# Patient Record
Sex: Female | Born: 1955
Health system: Southern US, Community
[De-identification: ages and names within clinical notes are randomized; demographics above are authoritative.]

## PROBLEM LIST (undated history)

## (undated) DIAGNOSIS — I519 Heart disease, unspecified: Secondary | ICD-10-CM

## (undated) DIAGNOSIS — E079 Disorder of thyroid, unspecified: Secondary | ICD-10-CM

## (undated) DIAGNOSIS — M1990 Unspecified osteoarthritis, unspecified site: Secondary | ICD-10-CM

## (undated) DIAGNOSIS — K279 Peptic ulcer, site unspecified, unspecified as acute or chronic, without hemorrhage or perforation: Secondary | ICD-10-CM

## (undated) DIAGNOSIS — I10 Essential (primary) hypertension: Secondary | ICD-10-CM

## (undated) DIAGNOSIS — E785 Hyperlipidemia, unspecified: Secondary | ICD-10-CM

## (undated) HISTORY — PX: AORTIC VALVE REPLACEMENT: SHX41

## (undated) HISTORY — DX: Unspecified osteoarthritis, unspecified site: M19.90

## (undated) HISTORY — DX: Essential (primary) hypertension: I10

## (undated) HISTORY — DX: Peptic ulcer, site unspecified, unspecified as acute or chronic, without hemorrhage or perforation: K27.9

## (undated) HISTORY — DX: Hyperlipidemia, unspecified: E78.5

## (undated) HISTORY — DX: Disorder of thyroid, unspecified: E07.9

## (undated) HISTORY — DX: Heart disease, unspecified: I51.9

## (undated) HISTORY — PX: TOTAL ABDOMINAL HYSTERECTOMY: SHX209

---

## 1999-08-30 ENCOUNTER — Encounter: Payer: Self-pay | Admitting: *Deleted

## 1999-08-30 ENCOUNTER — Encounter: Admission: RE | Admit: 1999-08-30 | Discharge: 1999-08-30 | Payer: Self-pay | Admitting: *Deleted

## 2008-07-10 DIAGNOSIS — M129 Arthropathy, unspecified: Secondary | ICD-10-CM | POA: Insufficient documentation

## 2010-10-10 ENCOUNTER — Encounter: Payer: Self-pay | Admitting: Family Medicine

## 2012-12-04 DIAGNOSIS — M79606 Pain in leg, unspecified: Secondary | ICD-10-CM | POA: Insufficient documentation

## 2013-04-12 DIAGNOSIS — I350 Nonrheumatic aortic (valve) stenosis: Secondary | ICD-10-CM | POA: Insufficient documentation

## 2013-08-30 DIAGNOSIS — I2510 Atherosclerotic heart disease of native coronary artery without angina pectoris: Secondary | ICD-10-CM | POA: Insufficient documentation

## 2013-08-30 DIAGNOSIS — K2970 Gastritis, unspecified, without bleeding: Secondary | ICD-10-CM | POA: Insufficient documentation

## 2014-05-16 ENCOUNTER — Encounter (INDEPENDENT_AMBULATORY_CARE_PROVIDER_SITE_OTHER): Payer: Self-pay

## 2014-05-16 ENCOUNTER — Ambulatory Visit (INDEPENDENT_AMBULATORY_CARE_PROVIDER_SITE_OTHER): Payer: BC Managed Care – HMO | Admitting: Psychiatry

## 2014-05-16 VITALS — HR 69 | Ht 64.0 in | Wt 132.0 lb

## 2014-05-16 DIAGNOSIS — F339 Major depressive disorder, recurrent, unspecified: Secondary | ICD-10-CM

## 2014-05-16 DIAGNOSIS — M5136 Other intervertebral disc degeneration, lumbar region: Secondary | ICD-10-CM | POA: Insufficient documentation

## 2014-05-16 DIAGNOSIS — E039 Hypothyroidism, unspecified: Secondary | ICD-10-CM | POA: Insufficient documentation

## 2014-05-16 DIAGNOSIS — F329 Major depressive disorder, single episode, unspecified: Secondary | ICD-10-CM

## 2014-05-16 DIAGNOSIS — F32A Depression, unspecified: Secondary | ICD-10-CM | POA: Insufficient documentation

## 2014-05-16 DIAGNOSIS — F419 Anxiety disorder, unspecified: Secondary | ICD-10-CM | POA: Insufficient documentation

## 2014-05-16 DIAGNOSIS — IMO0002 Reserved for concepts with insufficient information to code with codable children: Secondary | ICD-10-CM

## 2014-05-16 DIAGNOSIS — K219 Gastro-esophageal reflux disease without esophagitis: Secondary | ICD-10-CM | POA: Insufficient documentation

## 2014-05-16 DIAGNOSIS — E785 Hyperlipidemia, unspecified: Secondary | ICD-10-CM | POA: Insufficient documentation

## 2014-05-16 DIAGNOSIS — M810 Age-related osteoporosis without current pathological fracture: Secondary | ICD-10-CM | POA: Insufficient documentation

## 2014-05-16 MED ORDER — SERTRALINE HCL 100 MG PO TABS
ORAL_TABLET | ORAL | Status: DC
Start: 2014-05-16 — End: 2014-08-29

## 2014-05-16 NOTE — Patient Instructions (Signed)
Increase zoloft to  twice a day. Follow up with all providers on regular basis.

## 2014-05-16 NOTE — Progress Notes (Signed)
Patient ID: Rachael Douglas, female   DOB: 11-24-1955, 58 y.o.   MRN: [id number]  [facility] Initial Psychiatric Assessment   Rachael Douglas [id number] 58 y.o.  05/16/2014 10:30 AM  Chief Complaint:  depression  History of Present Illness:   Patient Presents for Initial Evaluation with symptoms of depression. patient was admitted to [facility] 2 weeks ago after she learned how to that her husband left her after 42 years of marriage. She found out that her husband was interested in men. The relationship has been cold and distant for the last 14 years. But she did not expect that the relationship is still going to end. She took some other medications including gabapentin, Xanax and became sedated. She called her sister sister came and took her to the hospital she was in the near stuporous stage.  She was kept in the hospital for 2 days and her medication is somewhat adjusted. She is on a high dose of gabapentin because of her back pain. Her Xanax was discontinued. Zoloft was kept at a dose of 150 mg.  She does endorse symptoms of depression even before she went to the hospital including disturbed sleep energy appetite,. Feeling withdrawn not interested in life in general. Some crying spells but not having possible suicide. She felt that this was accidental overdose and then to does not to die. She does have the support of her sister right now she lives with her stepson.  There is no associated psychotic symptoms. She has never seen a psychiatrist before there is no associated manic symptoms currently or in the past. She was being treated by primary care for ongoing clinical depression with Zoloft but has never been a psych hospital admission or suicide attempt.  Aggravating factors; recent breakup after 42 years. Other psychosocial issues including her living situation with her son.  Modifying factors; her sister is very supportive. She does okay when she takes her  medications  Context; recent breakup  Severity of depression; 2/10. 10 being no depression  There is no current panic symptoms he does endorse she has been on Xanax since 1986 it helps her anxiety, her worries. Says that she has had esophagitis and Xanax as the medication help her at that time. Recently her primary care physician has now changed Xanax to Klonopin 0.5 mg  No associated past trauma or sexual or physical   Past Psychiatric History/Hospitalization(s) Just this recent one.   Hospitalization for psychiatric illness: Yes History of Electroconvulsive Shock Therapy: No Prior Suicide Attempts: No  Medical History; No past medical history on file.  Allergies: Allergies  Allergen Reactions  . Alendronate Other (See Comments)    unknown  . Penicillins Swelling  . Ranitidine Other (See Comments)    Facial swelling    Medications: Outpatient Encounter Prescriptions as of 05/16/2014  Medication Sig  . albuterol (PROAIR HFA) 108 (90 BASE) MCG/ACT inhaler INHALE 2 PUFFS EVERY 6 HOURS AS NEEDED  . aspirin EC 325 MG tablet TAKE 1 TABLET DAILY  . azithromycin (ZITHROMAX Z-PAK) 250 MG tablet Take 250 mg by mouth.  . clindamycin (CLEOCIN) 150 MG capsule Take 150 mg by mouth.  . cyclobenzaprine (FLEXERIL) 10 MG tablet TAKE 1 TABLET TWICE A DAY AS NEEDED FOR SPASM  . doxycycline (VIBRA-TABS) 100 MG tablet Take 100 mg by mouth.  . furosemide (LASIX) 40 MG tablet Take 40 mg by mouth.  . gabapentin (NEURONTIN) 600 MG tablet TAKE 2 TABLETS THREE TIMES A DAY  . guaiFENesin-codeine (  ROBITUSSIN AC) 100-10 MG/5ML syrup Take by mouth.  . lansoprazole (PREVACID) 30 MG capsule TAKE 1 CAPSULE DAILY  . levothyroxine (SYNTHROID, LEVOTHROID) 75 MCG tablet Take 75 mcg by mouth.  . metoprolol tartrate (LOPRESSOR) 25 MG tablet TAKE ONE-HALF (1/2) TABLET TWICE A DAY  . mometasone-formoterol (DULERA) 200-5 MCG/ACT AERO Inhale 1 puff into the lungs.  . pravastatin (PRAVACHOL) 20 MG tablet TAKE 1  TABLET DAILY  . sertraline (ZOLOFT) 100 MG tablet Take total of  .  . triamcinolone cream (KENALOG) 0.1 % APPLY TO THE AFFECTED AREAS DAILY AS NEEDED  . [DISCONTINUED] sertraline (ZOLOFT) 100 MG tablet TAKE ONE AND ONE-HALF TABLETS DAILY  . metoprolol succinate (TOPROL-XL) 25 MG 24 hr tablet Take 25 mg by mouth.     Substance Abuse History: No regular use of alcohol or other drugs.   Family History; No family history on file.    Biopsychosocial History:  She grew up with her parents. She had 3 siblings sisters. No physical or sexual trauma. 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 She's been married for 42 years. Her husband left her recently she found out that he was interested in Men.    Labs:  No results found for this or any previous visit (from the past 2160 hour(s)).  As per [facility] recent admission at care everywhere.    Musculoskeletal: Strength & Muscle Tone: within normal limits Gait & Station: normal Patient leans: Backward  Mental Status Examination;   Psychiatric Specialty Exam: Physical Exam  Vitals reviewed. Constitutional: She appears well-developed and well-nourished. She appears distressed.    Review of Systems  Constitutional: Negative.   Neurological: Negative for dizziness, tremors, speech change and focal weakness.  Psychiatric/Behavioral: Positive for depression. Negative for suicidal ideas, hallucinations, memory loss and substance abuse. The patient is nervous/anxious. The patient does not have insomnia.     Pulse 69, height  (1.626 m), weight 132 lb (59.875 kg).Body mass index is 22.65 kg/(m^2).  General Appearance: Casual  Eye Contact::  Fair  Speech:  Slow  Volume:  Normal  Mood:  Dysphoric  Affect:  Congruent  Thought Process:  Coherent  Orientation:  Full (Time, Place, and Person)  Thought Content:  Rumination  Suicidal Thoughts:  No  Homicidal Thoughts:  No  Memory:  Immediate;   Fair Recent;    Fair  Judgement:  Fair  Insight:  Shallow  Psychomotor Activity:  Normal  Concentration:  Fair  Recall:  Fair  Akathisia:  Negative  Handed:  Right  AIMS (if indicated):     Assets:  Communication Skills Desire for Improvement Financial Resources/Insurance Housing Social Support  Sleep:        Assessment: Axis I: Maj. depressive disorder, unspecified. Rule out adjustment disorder depressed mood anxiety. Rule out mood disorder secondary to general medical condition.  Axis II: Deferred  Axis III:  No past medical history on file.  Axis IV: Psychosocial, recent breakup   Treatment Plan and Summary: Increase Zoloft to 100 mg twice a day. We discussed considering the recent breakup she would take time to recover but she's as of now not suicidal. She would benefit from therapy. Avoid taking Xanax to stop. She started on Klonopin cautioned not to take Klonopin on a long-term. Pertinent Labs and Relevant Prior Notes reviewed. Medication Side effects, benefits and risks reviewed/discussed with Patient. Time given for patient to respond and asks questions regarding the Diagnosis and Medications. Safety concerns and to report to  ER if suicidal or call 911. Relevant Medications refilled or called in to pharmacy. Discussed weight maintenance and Sleep Hygiene. Follow up with Primary care provider in regards to Medical conditions. Recommend compliance with medications and follow up office appointments. Discussed to avail opportunity to consider or/and continue Individual therapy with Counselor. Greater than 50% of time was spend in counseling and coordination of care with the patient.  Schedule for Follow up visit in 4 weeks or call in earlier as necessary.   Thresa Ross, MD 05/16/2014

## 2014-06-16 ENCOUNTER — Ambulatory Visit (HOSPITAL_COMMUNITY): Payer: Self-pay | Admitting: Psychiatry

## 2014-08-29 ENCOUNTER — Ambulatory Visit (INDEPENDENT_AMBULATORY_CARE_PROVIDER_SITE_OTHER): Payer: BC Managed Care – HMO | Admitting: Physician Assistant

## 2014-08-29 ENCOUNTER — Encounter (HOSPITAL_COMMUNITY): Payer: Self-pay | Admitting: Physician Assistant

## 2014-08-29 ENCOUNTER — Encounter (INDEPENDENT_AMBULATORY_CARE_PROVIDER_SITE_OTHER): Payer: Self-pay

## 2014-08-29 VITALS — HR 92 | Ht 64.0 in | Wt 122.0 lb

## 2014-08-29 DIAGNOSIS — F331 Major depressive disorder, recurrent, moderate: Secondary | ICD-10-CM

## 2014-08-29 DIAGNOSIS — F411 Generalized anxiety disorder: Secondary | ICD-10-CM

## 2014-08-29 DIAGNOSIS — F329 Major depressive disorder, single episode, unspecified: Secondary | ICD-10-CM

## 2014-08-29 MED ORDER — ARIPIPRAZOLE 5 MG PO TABS: 5.0000 mg | ORAL_TABLET | Freq: Every day | ORAL | Status: DC

## 2014-08-29 MED ORDER — SERTRALINE HCL 100 MG PO TABS: ORAL_TABLET | ORAL | Status: DC

## 2014-08-29 NOTE — Progress Notes (Signed)
[facility] [id number] Progress Note  Rachael BowlViolet R Douglas [id number] 58 y.o.  08/29/2014 4:22 PM  Chief Complaint:  MDD severe  History of Present Illness:  Patient notes that she doesn't care whether she lives or dies xanax is the only thing that helps... Suicidal Ideation: No Plan Formed: No Patient has means to carry out plan: No  Homicidal Ideation: No Plan Formed: No Patient has means to carry out plan: No  Review of Systems: Psychiatric: Agitation: Yes Hallucination: No Depressed Mood: Yes Insomnia: No Hypersomnia: No Altered Concentration: No Feels Worthless: No Grandiose Ideas: No Belief In Special Powers: No New/Increased Substance Abuse: No Compulsions: No  Neurologic: Headache: No Seizure: No Paresthesias: No  Past Medical Family, Social History: Husband of 40+ years walked out in August, 40 yr old son lives in basement, doesn't pay rent. Patient is unemployed since 2009, husband agreed to let a family friend live in the home while he recovers from cancer, with plans for the wife to care for him without consulting her.  Outpatient Encounter Prescriptions as of 08/29/2014  Medication Sig  . albuterol (PROAIR HFA) 108 (90 BASE) MCG/ACT inhaler INHALE 2 PUFFS EVERY 6 HOURS AS NEEDED  . aspirin EC 325 MG tablet TAKE 1 TABLET DAILY  . azithromycin (ZITHROMAX Z-PAK) 250 MG tablet Take 250 mg by mouth.  . clindamycin (CLEOCIN) 150 MG capsule Take 150 mg by mouth.  . cyclobenzaprine (FLEXERIL) 10 MG tablet TAKE 1 TABLET TWICE A DAY AS NEEDED FOR SPASM  . doxycycline (VIBRA-TABS) 100 MG tablet Take 100 mg by mouth.  . furosemide (LASIX) 40 MG tablet Take 40 mg by mouth.  . gabapentin (NEURONTIN) 600 MG tablet TAKE 2 TABLETS THREE TIMES A DAY  . guaiFENesin-codeine (ROBITUSSIN AC) 100-10 MG/5ML syrup Take by mouth.  . lansoprazole (PREVACID) 30 MG capsule TAKE 1 CAPSULE DAILY  . levothyroxine (SYNTHROID, LEVOTHROID) 75 MCG tablet Take 75 mcg by mouth.  .  metoprolol succinate (TOPROL-XL) 25 MG 24 hr tablet Take 25 mg by mouth.  . metoprolol tartrate (LOPRESSOR) 25 MG tablet TAKE ONE-HALF (1/2) TABLET TWICE A DAY  . mometasone-formoterol (DULERA) 200-5 MCG/ACT AERO Inhale 1 puff into the lungs.  [name]. oxymorphone (OPANA) 5 MG tablet Take 5 mg by mouth every 6 (six) hours as needed for pain.  . pravastatin (PRAVACHOL) 20 MG tablet TAKE 1 TABLET DAILY  . sertraline (ZOLOFT) 100 MG tablet Take total of 200mg  .  . triamcinolone cream (KENALOG) 0.1 % APPLY TO THE AFFECTED AREAS DAILY AS NEEDED    Past Psychiatric History/Hospitalization(s): none Anxiety: Yes Bipolar Disorder: No Depression: Yes Mania: No Psychosis: No Schizophrenia: No Personality Disorder: No Hospitalization for psychiatric illness: No History of Electroconvulsive Shock Therapy: No Prior Suicide Attempts: No  Physical Exam: Constitutional:  Pulse 92  Ht 5\' 4"  (1.626 m)  Wt 122 lb (55.339 kg)  BMI 20.93 kg/m2  General Appearance: alert, oriented, no acute distress  Musculoskeletal: Strength & Muscle Tone: within normal limits Gait & Station: normal Patient leans: N/A  Psychiatric: Speech (describe rate, volume, coherence, spontaneity, and abnormalities if any): normal  Thought Process (describe rate, content, abstract reasoning, and computation): normal  Associations: Coherent and Relevant  Thoughts: normal  Mental Status: Orientation: oriented to person, place, situation and day of week Mood & Affect: depressed affect Attention Span & Concentration: fair  Medical Decision Making (Choose Three): Established Problem, Stable/Improving (1) and New problem, with additional work up planned  Assessment: Axis I: MDD    Plan:  1. Medication management. 2. Add Aripiprazole 5mg  po qd. Gave patient Patient Assist Card for discount and information. 3. Patient clearly explained that Benzodiazepines will not be prescribed. >50% of visit is spent in education and  discussion of treatment plan. 4. Patient will also need to attend OUT Pt. Therapy as discussed. 5. Follow up as directed.  Rachael Boston, PA-C 08/29/2014

## 2014-09-23 ENCOUNTER — Ambulatory Visit (HOSPITAL_COMMUNITY): Payer: Self-pay | Admitting: Physician Assistant

## 2014-09-25 ENCOUNTER — Ambulatory Visit (INDEPENDENT_AMBULATORY_CARE_PROVIDER_SITE_OTHER): Payer: BLUE CROSS/BLUE SHIELD | Admitting: Family Medicine

## 2014-09-25 ENCOUNTER — Encounter: Payer: Self-pay | Admitting: Family Medicine

## 2014-09-25 VITALS — BP 132/70 | HR 81 | Ht 64.5 in | Wt 119.0 lb

## 2014-09-25 DIAGNOSIS — F331 Major depressive disorder, recurrent, moderate: Secondary | ICD-10-CM | POA: Diagnosis not present

## 2014-09-25 DIAGNOSIS — I10 Essential (primary) hypertension: Secondary | ICD-10-CM

## 2014-09-25 DIAGNOSIS — E785 Hyperlipidemia, unspecified: Secondary | ICD-10-CM | POA: Diagnosis not present

## 2014-09-25 DIAGNOSIS — E039 Hypothyroidism, unspecified: Secondary | ICD-10-CM

## 2014-09-25 DIAGNOSIS — Z1239 Encounter for other screening for malignant neoplasm of breast: Secondary | ICD-10-CM

## 2014-09-25 DIAGNOSIS — J449 Chronic obstructive pulmonary disease, unspecified: Secondary | ICD-10-CM

## 2014-09-25 DIAGNOSIS — M810 Age-related osteoporosis without current pathological fracture: Secondary | ICD-10-CM

## 2014-09-25 LAB — COMPLETE METABOLIC PANEL WITH GFR
ALT: 11 U/L (ref 0–35)
AST: 18 U/L (ref 0–37)
Albumin: 4.2 g/dL (ref 3.5–5.2)
Alkaline Phosphatase: 81 U/L (ref 39–117)
BUN: 14 mg/dL (ref 6–23)
CALCIUM: 9.1 mg/dL (ref 8.4–10.5)
CO2: 27 mEq/L (ref 19–32)
CREATININE: 0.93 mg/dL (ref 0.50–1.10)
Chloride: 103 mEq/L (ref 96–112)
GFR, EST AFRICAN AMERICAN: 78 mL/min
GFR, EST NON AFRICAN AMERICAN: 67 mL/min
Glucose, Bld: 85 mg/dL (ref 70–99)
Potassium: 3.6 mEq/L (ref 3.5–5.3)
Sodium: 140 mEq/L (ref 135–145)
Total Bilirubin: 0.4 mg/dL (ref 0.2–1.2)
Total Protein: 6.7 g/dL (ref 6.0–8.3)

## 2014-09-25 LAB — LIPID PANEL
CHOLESTEROL: 204 mg/dL — AB (ref 0–200)
HDL: 71 mg/dL (ref >39)
LDL CALC: 115 mg/dL — AB (ref 0–99)
TRIGLYCERIDES: 90 mg/dL (ref <150)
Total CHOL/HDL Ratio: 2.9 Ratio
VLDL: 18 mg/dL (ref 0–40)

## 2014-09-25 LAB — TSH: TSH: 1.748 u[IU]/mL (ref 0.350–4.500)

## 2014-09-25 MED ORDER — RISEDRONATE SODIUM 35 MG PO TABS: 35.0000 mg | ORAL_TABLET | ORAL | Status: DC

## 2014-09-25 MED ORDER — PREDNISONE 20 MG PO TABS: ORAL_TABLET | ORAL | Status: DC

## 2014-09-25 NOTE — Progress Notes (Signed)
CC: Rachael Douglas is a 59 y.o. female is here for Establish Care   Subjective: HPI:  59 year old here to establish care  Complains of COPD that has been present for at least a decade she is to be a smoker but no longer smokes. She reports at baseline she has no shortness of breath with activity nor cough or wheezing. Over the last week she's had drainage in the back of her throat with a nonproductive cough and mild shortness of breath. Symptoms are persistent mild in severity but have not been getting better or worse since onset. Interventions have included continued use of dulera and she has not tried using her albuterol during this time of illness. She denies fevers, chills, nausea, vomiting nor chest pain.  Complains of long-standing depression that has been present for matter of years. She has a history of an overdose however she tells me this was unintentional double dosage of her narcotics. Looking at emergency room notes from last August when she was hospitalized she was quite hostile when she was eventually involuntarily committed to the behavioral health unit. She recently saw our behavioral health colleagues down stairs however she has decided not to go back and see them due to personality differences.  She tells me she has suicidal thoughts but does not have a plan and does not feel that she would act on these thoughts. Depression and suicidal thoughts are heavily influenced by her husband recently cheating on her with another man.  She is a history of hypothyroidism currently taking 75 g of levothyroxine on a daily basis. It's been over a year since her thyroid function was checked last. She complains of chronic dry skin and recent hair loss over the past few months. There's been no unintentional weight gain or loss nor any gastrointestinal complaints  She has a history of hyperlipidemia and it appears her cholesterol has not been checked in the last year. She is taking pravastatin on a  daily basis without myalgias or right upper quadrant pain.  She has a history of essential hypertension currently taking metoprolol twice a day but XR  Formulation. No outside blood pressures to report.  She has a history of osteoporosis and believes that she had a DEXA scan within the last year, the only results I have access to is a February 2014 reading reflecting a lumbar T score of -2.6, femur T score of -3.2. She tells me she has tried Fosamax in the past however it caused intolerable dysphagia.  Review of Systems - General ROS: negative for - chills, fever, night sweats, weight gain or weight loss Ophthalmic ROS: negative for - decreased vision ENT ROS: negative for - hearing change, nasal congestion, tinnitus or allergies Hematological and Lymphatic ROS: negative for - bleeding problems, bruising or swollen lymph nodes Breast ROS: negative Respiratory ROS: no blood and sputum Cardiovascular ROS: no chest pain or dyspnea on exertion Gastrointestinal ROS: no abdominal pain, change in bowel habits, or black or bloody stools Genito-Urinary ROS: negative for - genital discharge, genital ulcers, incontinence or abnormal bleeding from genitals Musculoskeletal ROS: negative for - joint pain or muscle pain other than chronic back pain and right knee pain Neurological ROS: negative for - headaches or memory loss Dermatological ROS: negative for lumps, mole changes, rash and skin lesion changes  Past Medical History  Diagnosis Date  . Heart disease   . Thyroid disease     Past Surgical History  Procedure Laterality Date  . Total abdominal hysterectomy    .  Aortic valve replacement     Family History  Problem Relation Age of Onset  . Cancer    . Depression    . Diabetes    . Hypertension      History   Social History  . Marital Status: Married    Spouse Name: N/A    Number of Children: N/A  . Years of Education: N/A   Occupational History  . Not on file.   Social History  Main Topics  . Smoking status: Former Smoker    Types: Cigarettes    Quit date: 05/30/2013  . Smokeless tobacco: Never Used  . Alcohol Use: No  . Drug Use: No  . Sexual Activity: Not Currently   Other Topics Concern  . Not on file   Social History Narrative     Objective: BP 132/70 mmHg  Pulse 81  Ht 5' 4.5" (1.638 m)  Wt 119 lb (53.978 kg)  BMI 20.12 kg/m2  General: Alert and Oriented, No Acute Distress HEENT: Pupils equal, round, reactive to light. Conjunctivae clear.  Moist pedis membranes parents unremarkable Lungs: Comfortable work of breathing with trace end expiratory rhonchi in the central lung fields without any wheezing or rales. Cardiac: Regular rate and rhythm. Normal S1/S2.  No murmurs, rubs, nor gallops.   Extremities: No peripheral edema.  Strong peripheral pulses.  Mental Status: No depression, anxiety, nor agitation. Skin: Warm and dry.  Assessment & Plan: [name] was seen today for establish care.  Diagnoses and associated orders for this visit:  COPD, moderate - predniSONE (DELTASONE) 20 MG tablet; Three tabs at once daily for five days.  Major depressive disorder, recurrent episode, moderate - Ambulatory referral to Psychiatry  Acquired hypothyroidism - TSH  HLD (hyperlipidemia) - Lipid panel  OP (osteoporosis) - risedronate (ACTONEL) 35 MG tablet; Take 1 tablet (35 mg total) by mouth every 7 (seven) days. with water on empty stomach, nothing by mouth or lie down for next 30 minutes.  Essential hypertension - COMPLETE METABOLIC PANEL WITH GFR  Screening for malignant neoplasm of breast - MM DIGITAL SCREENING BILATERAL; Future    COPD exacerbation: Start prednisone for only 5 days continued Dulera begin using albuterol on an as-needed basis for cough shortness of breath or wheezing Major depressive disorder: We'll provide referral to [facility] psychiat[facility] to discuss with her that I feel her degree of depression and history of intentional or  unintentional overdose warrants specialty care of her mental health. Hypothyroidism: Clinically uncontrolled with her recent loss of hair needs TSH today continue 75 g of levothyroxine pending results Hyperlipidemia: Overdue for lipid panel and liver function Osteoporosis: Uncontrolled strongly encourage her to start on a bisphosphonate we discussed the risks and benefits of taking a medication such as Actonel which she is agreeable to begin Essential hypertension: Controlled continue metoprolol, check a renal function Due for screening mammogram  60 minutes spent face-to-face during visit today of which at least 50% was counseling or coordinating care regarding: 1. COPD, moderate   2. Major depressive disorder, recurrent episode, moderate   3. Acquired hypothyroidism   4. HLD (hyperlipidemia)   5. OP (osteoporosis)   6. Essential hypertension   7. Screening for malignant neoplasm of breast       Return in about 3 months (around 12/25/2014) for Routine Follow Up.

## 2014-09-26 ENCOUNTER — Telehealth: Payer: Self-pay | Admitting: Family Medicine

## 2014-09-26 ENCOUNTER — Ambulatory Visit (HOSPITAL_COMMUNITY): Payer: Self-pay | Admitting: Physician Assistant

## 2014-09-26 ENCOUNTER — Other Ambulatory Visit: Payer: Self-pay | Admitting: *Deleted

## 2014-09-26 DIAGNOSIS — J449 Chronic obstructive pulmonary disease, unspecified: Secondary | ICD-10-CM

## 2014-09-26 MED ORDER — PREDNISONE 20 MG PO TABS: ORAL_TABLET | ORAL | Status: AC

## 2014-09-26 MED ORDER — LEVOTHYROXINE SODIUM 75 MCG PO TABS: 75.0000 ug | ORAL_TABLET | Freq: Every day | ORAL | Status: DC

## 2014-09-26 MED ORDER — PRAVASTATIN SODIUM 40 MG PO TABS: 40.0000 mg | ORAL_TABLET | Freq: Every day | ORAL | Status: DC

## 2014-09-26 NOTE — Telephone Encounter (Signed)
Sue Lushndrea, Will you please let patient know that her levothyroxine dose appears to be adequate, [facility]'ve [facility].  Her LDL cholesterol does not appear to be at goal therefore i've sent in an increased pravastatin dose of 40mg  [facility].  I'd recommend follow up in 3 months.

## 2014-09-26 NOTE — Telephone Encounter (Signed)
Pt.notified

## 2014-09-29 ENCOUNTER — Ambulatory Visit (INDEPENDENT_AMBULATORY_CARE_PROVIDER_SITE_OTHER): Payer: BLUE CROSS/BLUE SHIELD | Admitting: Licensed Clinical Social Worker

## 2014-09-29 ENCOUNTER — Encounter (HOSPITAL_COMMUNITY): Payer: Self-pay | Admitting: Licensed Clinical Social Worker

## 2014-09-29 ENCOUNTER — Telehealth: Payer: Self-pay | Admitting: *Deleted

## 2014-09-29 DIAGNOSIS — F331 Major depressive disorder, recurrent, moderate: Secondary | ICD-10-CM | POA: Diagnosis not present

## 2014-09-29 NOTE — Progress Notes (Signed)
Patient:   Rachael Douglas   DOB:   02/10/1956  MR Number:  [id number]  Location:  [facility] [place] [address] [place] [address] Dept: [phone]           Date of Service:   09/29/14  Start Time:   11:05am End Time:   12:05pm  Provider/Observer:  Marilu Favre Clinical Social Work       Billing Code/Service: [phone]  Comprehensive Clinical Assessment  Information for assessment provided by: patient   Chief Complaint:   Anger and depression      Presenting Problem/Symptoms:  Having trouble coming to terms with learning that her husband of over 40 years had been having a relationship with a man.  He left in August.   She has a lot of anger and resentment.   She has 3 adults living in her home who are not contributing financially.      Behavioral Observation: Rachael Douglas  presents as a 59 y.o.-year-old  Caucasian Female who appeared her stated age. her dress was Appropriate and she was Fairly Groomed.  She presented as agitated stating that she did not particularly want to be here.  Overall level of cooperation and motivation was minimal.  There were physical disabilities noted.  She insisted on sitting on the couch because of reported pain from arthritis.        Mental Health Symptoms:    Depression:    Current symptoms include depressed mood, insomnia, psychomotor agitation, fatigue, feelings of worthlessness/guilt, difficulty concentrating, hopelessness, impaired memory, recurrent thoughts of death, weight loss, decreased appetite,.      Anxiety: Moderate restlessness or feeling on edge, easily fatigued, irritability, difficulty concentrating, muscle tension, sleep disturbance  Panic Attacks: Absent   Self-Harm Potential: Thoughts of Self-Harm: vague current thoughts Method: no plan Availability of means: na  Previous attempts? no Preoccupation with death?  yes   Dangerousness to Others Potential: Denies HI     Mania/hypomania: na    Psychosis: na            Mental Status  Interactions:    Minimal   Attention:   Variable  Memory:   poor recent  Speech:   Normal  Flow of Thought:  circumstantial  Thought Content:  Rumination  Orientation:   person, place and time/date  Judgment:   Fair  Affect/Mood:   Irritable  Insight:   Lacking       Medical History:    Past Medical History  Diagnosis Date  . Heart disease   . Thyroid disease    Arthritis  Current medications:        Outpatient Encounter Prescriptions as of 09/29/2014  Medication Sig  . albuterol (PROAIR HFA) 108 (90 BASE) MCG/ACT inhaler INHALE 2 PUFFS EVERY 6 HOURS AS NEEDED  . ARIPiprazole (ABILIFY) 5 MG tablet Take 1 tablet (5 mg total) by mouth daily.  [name] aspirin EC 325 MG tablet TAKE 1 TABLET DAILY  . clindamycin (CLEOCIN) 150 MG capsule Take 150 mg by mouth.  . cyclobenzaprine (FLEXERIL) 10 MG tablet TAKE 1 TABLET TWICE A DAY AS NEEDED FOR SPASM  . furosemide (LASIX) 40 MG tablet Take 40 mg by mouth.  . gabapentin (NEURONTIN) 600 MG tablet TAKE 2 TABLETS THREE TIMES A DAY  . lansoprazole (PREVACID) 30 MG capsule TAKE 1 CAPSULE DAILY  . levothyroxine (SYNTHROID, LEVOTHROID) 75 MCG tablet Take 1 tablet (75 mcg total) by  mouth daily before breakfast.  . metoprolol succinate (TOPROL-XL) 25 MG 24 hr tablet Take 25 mg by mouth daily. Take 1/2 in the am and 1/2 in the pm  . mometasone-formoterol (DULERA) 200-5 MCG/ACT AERO Inhale 1 puff into the lungs.  [name]. oxymorphone (OPANA) 5 MG tablet Take 5 mg by mouth every 6 (six) hours as needed for pain.  . pravastatin (PRAVACHOL) 40 MG tablet Take 1 tablet (40 mg total) by mouth daily. TAKE 1 TABLET DAILY  . predniSONE (DELTASONE) 20 MG tablet Three tabs at once daily for five days.  . risedronate (ACTONEL) 35 MG tablet Take 1 tablet (35 mg total) by mouth every 7 (seven) days. with water on empty  stomach, nothing by mouth or lie down for next 30 minutes.  . sertraline (ZOLOFT) 100 MG tablet Take total of 200mg  .  . triamcinolone cream (KENALOG) 0.1 % APPLY TO THE AFFECTED AREAS DAILY AS NEEDED              Mental Health/Substance Use Treatment History:    Denies      Family Med/Psych History:  Family History  Problem Relation Age of Onset  . Cancer    . Depression    . Diabetes    . 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  She acknowledges having a lot of anger about him and others keeping secrets for so many years.    Disposition/Plan: After explaining the types of interventions she can expect in individual therapy the patient expressed ambivilence about participating.  Will call back to schedule if she decides to participate.  Interventions will focus on processing thoughts and feelings related to her marriage, teaching skills for identifying and changing negative or irrational thinking patterns and teaching skills for stress management.

## 2014-09-29 NOTE — Telephone Encounter (Signed)
Pt left a message that her knee was very swollen and wanted an xray ordered. Spoke with pt and advised to schedule an appt. Pt states she will wait until the am and see if it's still swollen and then she will decide if she needs to come in

## 2014-10-01 ENCOUNTER — Ambulatory Visit (INDEPENDENT_AMBULATORY_CARE_PROVIDER_SITE_OTHER): Payer: BLUE CROSS/BLUE SHIELD

## 2014-10-01 DIAGNOSIS — Z1231 Encounter for screening mammogram for malignant neoplasm of breast: Secondary | ICD-10-CM

## 2014-10-01 DIAGNOSIS — Z1239 Encounter for other screening for malignant neoplasm of breast: Secondary | ICD-10-CM

## 2014-10-03 ENCOUNTER — Ambulatory Visit: Payer: Self-pay | Admitting: Family Medicine

## 2014-10-13 ENCOUNTER — Telehealth: Payer: Self-pay | Admitting: Family Medicine

## 2014-10-13 NOTE — Telephone Encounter (Signed)
Patient walked in and req to get a refill of Linzess. Thanks

## 2014-10-13 NOTE — Telephone Encounter (Signed)
Don't see this on her med list or scanned med list that she brought to office. Office visit required. vm left

## 2014-10-21 ENCOUNTER — Telehealth: Payer: Self-pay | Admitting: *Deleted

## 2014-10-21 NOTE — Telephone Encounter (Signed)
Pt called and wanted to know if she could increase the dose of the abilify. Called pt back and told her that Verne SpurrNeil Mashburn PA-c would be the one to manage this medication since she is under his care. The patient then asked if I thought it would be ok if she stopped taking the medication. I advised her that I could not give her any advice about the abilify  since I am not a healthcare provider and also since it was prescribed by a specialist in another office. Encouraged her to call behavioral health with her concerns. Pt voiced understanding

## 2014-10-23 ENCOUNTER — Encounter: Payer: Self-pay | Admitting: Family Medicine

## 2014-10-23 DIAGNOSIS — K222 Esophageal obstruction: Secondary | ICD-10-CM | POA: Insufficient documentation

## 2014-10-23 DIAGNOSIS — Z952 Presence of prosthetic heart valve: Secondary | ICD-10-CM | POA: Insufficient documentation

## 2014-10-30 ENCOUNTER — Telehealth: Payer: Self-pay | Admitting: *Deleted

## 2014-10-30 NOTE — Telephone Encounter (Signed)
Dr. Nyoka CowdenPuthaval is not accepting new patients because they have a back log [facility] are [facility]

## 2014-10-30 NOTE — Telephone Encounter (Signed)
Can you please let patient know this and can you see if there is any other nearby psych office accepting patients, for now I'd recommend she stay established with behavioral health downstairs.

## 2014-10-30 NOTE — Telephone Encounter (Signed)
Pt notified; routing to jennifer to see who is accepting new patients

## 2014-11-07 ENCOUNTER — Telehealth: Payer: Self-pay | Admitting: *Deleted

## 2014-11-07 NOTE — Telephone Encounter (Signed)
Pt called again wanting us to refill linzess and abilify. Again I explained to pt that she needs to f/u with behavioral health since they are managing the abilify. Pt needs appt for linzess since its not on her med list and she has not been seen for dx related to this med.pt voiced understanding

## 2014-11-12 ENCOUNTER — Telehealth: Payer: Self-pay

## 2014-11-12 NOTE — Telephone Encounter (Signed)
Fax refill request for Linzess 145 mcg capsules take once daily # 90. Medication is not on medication list. The Linzess is on outside medications under [facility].

## 2014-11-13 MED ORDER — LINACLOTIDE 145 MCG PO CAPS: 145.0000 ug | ORAL_CAPSULE | Freq: Every day | ORAL | Status: DC

## 2014-11-13 NOTE — Telephone Encounter (Signed)
Rx approved, Rx sent to her [facility]

## 2014-11-13 NOTE — Telephone Encounter (Signed)
Called Rachael Douglas to let her know that her linzess was faxed over to the pharmacy today. - CF

## 2014-12-17 ENCOUNTER — Encounter: Payer: Self-pay | Admitting: Family Medicine

## 2014-12-17 ENCOUNTER — Ambulatory Visit (INDEPENDENT_AMBULATORY_CARE_PROVIDER_SITE_OTHER): Payer: BLUE CROSS/BLUE SHIELD | Admitting: Family Medicine

## 2014-12-17 VITALS — BP 126/74 | HR 91 | Wt 117.0 lb

## 2014-12-17 DIAGNOSIS — K5900 Constipation, unspecified: Secondary | ICD-10-CM

## 2014-12-17 DIAGNOSIS — J302 Other seasonal allergic rhinitis: Secondary | ICD-10-CM

## 2014-12-17 DIAGNOSIS — F32A Depression, unspecified: Secondary | ICD-10-CM

## 2014-12-17 DIAGNOSIS — F418 Other specified anxiety disorders: Secondary | ICD-10-CM

## 2014-12-17 DIAGNOSIS — G4700 Insomnia, unspecified: Secondary | ICD-10-CM | POA: Diagnosis not present

## 2014-12-17 DIAGNOSIS — F419 Anxiety disorder, unspecified: Principal | ICD-10-CM

## 2014-12-17 DIAGNOSIS — F329 Major depressive disorder, single episode, unspecified: Secondary | ICD-10-CM

## 2014-12-17 MED ORDER — ARIPIPRAZOLE 5 MG PO TABS: 5.0000 mg | ORAL_TABLET | Freq: Every day | ORAL | Status: DC

## 2014-12-17 MED ORDER — MONTELUKAST SODIUM 10 MG PO TABS: 10.0000 mg | ORAL_TABLET | Freq: Every day | ORAL | Status: DC

## 2014-12-17 MED ORDER — ZOLPIDEM TARTRATE 5 MG PO TABS: 5.0000 mg | ORAL_TABLET | Freq: Every evening | ORAL | Status: DC | PRN

## 2014-12-17 NOTE — Progress Notes (Signed)
CC: Rachael Douglas is a 59 y.o. female is here for wants Dr. Ivan AnchorsHommel to take over psych meds   Subjective: HPI:  Since I saw her last she has not returned to  Psychiatry downstairs. She is requesting refills on Abilify today and would like for me to take over this medication. She tells me that she's been much more anxious and subjectively depressed lately since her husband left her and is no longer providing her with any financial support. She's having difficulty staying asleep and falling asleep. Her appetite seems to be regular. There have been no thoughts of wanting to harm herself or others.  She does note she cannot identify what exactly is keeping her awake at night. She seems to think about all the things in her life causing her stress and she continues to worry daily. Symptoms are severe in severity. Present on a nightly basis. Nothing seems to make it better or worse that she knows of other than that described above  She continues to have constipation if she does not take linzess on a daily basis.providedshe takes this on a daily basis she has no difficulty with bowel movements or constipation.  Complaining of postnasal drip with a nonproductive cough that has been present ever since the pollen came out. She is taking Benadryl but she still has some symptoms that linger to mild to moderate degree on daily basis. She is not having any nocturnal need for albuterol and she tells me that she uses her albuterol "rarely". No wheezing shortness of breath fevers or chills  Review Of Systems Outlined In HPI  Past Medical History  Diagnosis Date  . Heart disease   . Thyroid disease   . Arthritis     Past Surgical History  Procedure Laterality Date  . Total abdominal hysterectomy    . Aortic valve replacement     Family History  Problem Relation Age of Onset  . Cancer    . Depression    . Diabetes    . Hypertension      History   Social History  . Marital Status: Married    Spouse  Name: N/A  . Number of Children: N/A  . Years of Education: N/A   Occupational History  . Not on file.   Social History Main Topics  . Smoking status: Former Smoker    Types: Cigarettes    Quit date: 05/30/2013  . Smokeless tobacco: Never Used  . Alcohol Use: No  . Drug Use: No  . Sexual Activity: Not Currently   Other Topics Concern  . Not on file   Social History Narrative     Objective: BP 126/74 mmHg  Pulse 91  Wt 117 lb (53.071 kg)  General: Alert and Oriented, No Acute Distress HEENT: Pupils equal, round, reactive to light. Conjunctivae clear.  Moist mucous membrane pharynx unremarkable Lungs: Clear to auscultation bilaterally, no wheezing/ronchi/rales.  Comfortable work of breathing. Good air movement. Cardiac: Regular rate and rhythm. Normal S1/S2.  Grade 2/6 systolic murmur, rubs, nor gallops.   Extremities: No peripheral edema.  Strong peripheral pulses.  Mental Status: mild depression no agitation or anxiety. Skin: Warm and dry.  Assessment & Plan: Masayo was seen today for wants dr. Ivan Anchorshommel to take over psych meds.  Diagnoses and all orders for this visit:  Anxiety and depression  Insomnia  Constipation, unspecified constipation type  Seasonal allergies  Other orders -     ARIPiprazole (ABILIFY) 5 MG tablet; Take 1 tablet (5  mg total) by mouth daily. -     montelukast (SINGULAIR) 10 MG tablet; Take 1 tablet (10 mg total) by mouth at bedtime. -     zolpidem (AMBIEN) 5 MG tablet; Take 1 tablet (5 mg total) by mouth at bedtime as needed for sleep.   Anxiety and depression: Uncontrolled, again I've advised her that she needs to see a psychiatrist for management of anxiety and depression keeping in mind her long-standing history of depression and narcotic overdose last year. I informed her that I will give her one month of Abilify to give her time to get an appointment downstairs with behavioral health and it is her decision on whether or not she  reestablishes however I will not provide any Abilify beyond this month. Seasonal allergies: Uncontrolled on Benadryl alone therefore start simulator. Constipation: Controlled on linzess Insomnia: Uncontrolled, she's done well with Ambien in the past without any known side effects therefore restarting 5 mg formulation.   Return if symptoms worsen or fail to improve.

## 2014-12-18 ENCOUNTER — Telehealth: Payer: Self-pay | Admitting: *Deleted

## 2014-12-18 NOTE — Telephone Encounter (Signed)
Pt called and left a message that she has a place under her chin that the dermatologist would give her bactrim for. She states she forgot to mention this at her appt yesterday and wanted to know if Dr. Ivan AnchorsHommel be willing to write a rx for bactrim for the place under her chin

## 2014-12-18 NOTE — Telephone Encounter (Signed)
Called patient  & she stated that it's like a real bad pimple that is red around it  & very sore. And that she's had it for x 4 weeks .

## 2014-12-18 NOTE — Telephone Encounter (Signed)
Yes probably, can she describe the lesion in a little more detail first?

## 2014-12-19 MED ORDER — SULFAMETHOXAZOLE-TRIMETHOPRIM 800-160 MG PO TABS: ORAL_TABLET | ORAL | Status: AC

## 2014-12-19 NOTE — Telephone Encounter (Signed)
Ok sounds reasonable, Rx sent to [facility].  If no better by next week follow up for a biopsy.

## 2014-12-19 NOTE — Telephone Encounter (Signed)
Spoke with patient & informed :Rx sent to [facility]. If no better by next week follow up for a biopsy

## 2015-01-30 ENCOUNTER — Other Ambulatory Visit: Payer: Self-pay | Admitting: Family Medicine

## 2015-01-30 NOTE — Telephone Encounter (Signed)
[facility]ills [facility], will route to provider for approval.

## 2015-02-02 MED ORDER — CYCLOBENZAPRINE HCL 10 MG PO TABS: 10.0000 mg | ORAL_TABLET | Freq: Two times a day (BID) | ORAL | Status: DC | PRN

## 2015-02-02 MED ORDER — MONTELUKAST SODIUM 10 MG PO TABS: 10.0000 mg | ORAL_TABLET | Freq: Every day | ORAL | Status: DC

## 2015-02-02 MED ORDER — FUROSEMIDE 40 MG PO TABS: 40.0000 mg | ORAL_TABLET | Freq: Every day | ORAL | Status: DC | PRN

## 2015-02-02 NOTE — Telephone Encounter (Signed)
Rachael Douglas,  Rxs have been sent

## 2015-03-03 ENCOUNTER — Other Ambulatory Visit: Payer: Self-pay | Admitting: Family Medicine

## 2015-03-10 ENCOUNTER — Telehealth: Payer: Self-pay | Admitting: Family Medicine

## 2015-03-10 NOTE — Telephone Encounter (Signed)
[facility]ills [facility] 

## 2015-04-15 ENCOUNTER — Other Ambulatory Visit: Payer: Self-pay | Admitting: Family Medicine

## 2015-04-15 MED ORDER — ARIPIPRAZOLE 5 MG PO TABS: 5.0000 mg | ORAL_TABLET | Freq: Every day | ORAL | Status: DC

## 2015-04-20 ENCOUNTER — Other Ambulatory Visit: Payer: Self-pay | Admitting: *Deleted

## 2015-04-20 MED ORDER — ARIPIPRAZOLE 5 MG PO TABS
5.0000 mg | ORAL_TABLET | Freq: Every day | ORAL | Status: DC
Start: 2015-04-20 — End: 2015-05-22

## 2015-05-14 ENCOUNTER — Encounter: Payer: Self-pay | Admitting: Family Medicine

## 2015-05-19 ENCOUNTER — Other Ambulatory Visit: Payer: Self-pay | Admitting: Family Medicine

## 2015-05-19 MED ORDER — FUROSEMIDE 40 MG PO TABS: 40.0000 mg | ORAL_TABLET | Freq: Every day | ORAL | Status: DC | PRN

## 2015-05-19 NOTE — Telephone Encounter (Signed)
Requested Rx be sent to local pharmacy, [facility].

## 2015-05-22 ENCOUNTER — Ambulatory Visit (INDEPENDENT_AMBULATORY_CARE_PROVIDER_SITE_OTHER): Payer: BLUE CROSS/BLUE SHIELD | Admitting: Family Medicine

## 2015-05-22 ENCOUNTER — Encounter: Payer: Self-pay | Admitting: Family Medicine

## 2015-05-22 VITALS — BP 122/73 | HR 83 | Wt 132.0 lb

## 2015-05-22 DIAGNOSIS — E039 Hypothyroidism, unspecified: Secondary | ICD-10-CM | POA: Diagnosis not present

## 2015-05-22 DIAGNOSIS — A499 Bacterial infection, unspecified: Secondary | ICD-10-CM

## 2015-05-22 DIAGNOSIS — B9689 Other specified bacterial agents as the cause of diseases classified elsewhere: Secondary | ICD-10-CM

## 2015-05-22 DIAGNOSIS — J329 Chronic sinusitis, unspecified: Secondary | ICD-10-CM | POA: Diagnosis not present

## 2015-05-22 DIAGNOSIS — E785 Hyperlipidemia, unspecified: Secondary | ICD-10-CM | POA: Diagnosis not present

## 2015-05-22 DIAGNOSIS — F411 Generalized anxiety disorder: Secondary | ICD-10-CM

## 2015-05-22 MED ORDER — ALPRAZOLAM 1 MG PO TABS: ORAL_TABLET | ORAL | Status: DC

## 2015-05-22 MED ORDER — DOXYCYCLINE HYCLATE 100 MG PO TABS: ORAL_TABLET | ORAL | Status: AC

## 2015-05-22 NOTE — Progress Notes (Signed)
CC: Rachael Douglas is a 59 y.o. female is here for f/u thyroid   Subjective: HPI:  Follow-up hypothyroidism: Continues on 75 MCG of levothyroxine daily. Over the past 2 months she's noticed her hair is falling out only on the scalp in a generalized pattern, she's also had extremely dry skin despite using lotion on a daily basis. She feels like her dose is too small. Denies abdominal pain constipation or weight change.  Follow-up anxiety: Since I saw her last her psychiatrist started her on Cymbalta. She tells me that her psychiatrist is also recommended she continue her former regimen of Xanax however would prefer for me to manage this since patient has better access to me than the psychiatrist.  Complains of facial pressure localized in the left cheek along with postnasal drip, nonproductive cough and nasal congestion. Symptoms have been present for about a week now worsening on a daily basis. Nothing seems to make them better or worse but there have been no interventions as of yet  Follow hyperlipidemia: She decided that she would stop taking her pravastatin. He is becoming a hassle to take an additional pill on top of all the pill she is already taking. She has a history of coronary artery disease but does not believe that she needs to be on statin. Denies chest pain shortness of breath orthopnea nor peripheral edema   Review Of Systems Outlined In HPI  Past Medical History  Diagnosis Date  . Heart disease   . Thyroid disease   . Arthritis     Past Surgical History  Procedure Laterality Date  . Total abdominal hysterectomy    . Aortic valve replacement     Family History  Problem Relation Age of Onset  . Cancer    . Depression    . Diabetes    . Hypertension      Social History   Social History  . Marital Status: Married    Spouse Name: N/A  . Number of Children: N/A  . Years of Education: N/A   Occupational History  . Not on file.   Social History Main Topics  .  Smoking status: Former Smoker    Types: Cigarettes    Quit date: 05/30/2013  . Smokeless tobacco: Never Used  . Alcohol Use: No  . Drug Use: No  . Sexual Activity: Not Currently   Other Topics Concern  . Not on file   Social History Narrative     Objective: BP 122/73 mmHg  Pulse 83  Wt 132 lb (59.875 kg)  General: Alert and Oriented, No Acute Distress HEENT: Pupils equal, round, reactive to light. Conjunctivae clear.  External ears unremarkable, canals clear with intact TMs with appropriate landmarks.  Middle ear appears open without effusion. Pink inferior turbinates.  Moist mucous membranes, pharynx without inflammation nor lesions.  Neck supple without palpable lymphadenopathy nor abnormal masses. Lungs: Clear and comfortable work of breathing Cardiac: Regular rate and rhythm.  Extremities: No peripheral edema.  Strong peripheral pulses.  Mental Status: No depression, anxiety, nor agitation. Skin: Warm and dry.  Assessment & Plan: Lona was seen today for f/u thyroid.  Diagnoses and all orders for this visit:  Acquired hypothyroidism -     TSH -     Lipid panel  Generalized anxiety disorder -     ALPRAZolam (XANAX) 1 MG tablet; One by mouth every night and no more than one during the day only as needed for anxiety. -     Lipid  panel  Bacterial sinusitis -     doxycycline (VIBRA-TABS) 100 MG tablet; One by mouth twice a day for ten days. -     Lipid panel  Hyperlipidemia -     Lipid panel   Hypothyroidism: Clinically uncontrolled but due for TSH prior to adjusting levothyroxine Anxiety: Discussed that we'll trust her and refill her Xanax but only at a dose of no more than twice a day. If it turns out that this recommendation was never made by her psychiatrist will need to consider discharging patient. Hyperlipidemia sinusitis: Start doxycycline consider nasal saline washes Hyperlipidemia: Discussed that she needs to be on a statin due to her history of coronary  artery disease, checking lipid panel to determine strength of statin needed.  Return if symptoms worsen or fail to improve.

## 2015-05-23 LAB — LIPID PANEL
Cholesterol: 276 mg/dL — ABNORMAL HIGH (ref 125–200)
HDL: 74 mg/dL (ref >=46)
LDL CALC: 185 mg/dL — AB (ref <130)
Total CHOL/HDL Ratio: 3.7 Ratio (ref <=5.0)
Triglycerides: 87 mg/dL (ref <150)
VLDL: 17 mg/dL (ref <30)

## 2015-05-23 LAB — TSH: TSH: 1.946 u[IU]/mL (ref 0.350–4.500)

## 2015-05-26 ENCOUNTER — Telehealth: Payer: Self-pay | Admitting: *Deleted

## 2015-05-26 ENCOUNTER — Telehealth: Payer: Self-pay | Admitting: Family Medicine

## 2015-05-26 MED ORDER — PRAVASTATIN SODIUM 80 MG PO TABS: 80.0000 mg | ORAL_TABLET | Freq: Every day | ORAL | Status: DC

## 2015-05-26 MED ORDER — LEVOTHYROXINE SODIUM 75 MCG PO TABS: 75.0000 ug | ORAL_TABLET | Freq: Every day | ORAL | Status: DC

## 2015-05-26 NOTE — Telephone Encounter (Signed)
Rachael Douglas, Will you please let patient know that her LDL cholesterol is quite high and uncontrolled.  I'd recommend she start pravastatin at a dose of  daily.  Also her thyroid supplementation appears to be adequate, to address her hair loss I'd recommend starting any OTC vitamin that contains Biotin.  [facility]

## 2015-05-26 NOTE — Telephone Encounter (Signed)
[facility] to cancel pravastatin and thyroid med because pt wanted it sent [facility]

## 2015-06-12 NOTE — Telephone Encounter (Signed)
Sue Lush, Do you know if this patient was called, I saw it was still open in my "open encounters" in box.

## 2015-06-15 NOTE — Telephone Encounter (Signed)
Pt was notified on the 9-6. ( see other phone note)

## 2015-06-24 ENCOUNTER — Other Ambulatory Visit: Payer: Self-pay

## 2015-06-24 MED ORDER — LEVOTHYROXINE SODIUM 75 MCG PO TABS: 75.0000 ug | ORAL_TABLET | Freq: Every day | ORAL | Status: DC

## 2015-08-17 ENCOUNTER — Other Ambulatory Visit: Payer: Self-pay

## 2015-08-17 DIAGNOSIS — F411 Generalized anxiety disorder: Secondary | ICD-10-CM

## 2015-08-17 MED ORDER — ALPRAZOLAM 1 MG PO TABS: ORAL_TABLET | ORAL | Status: DC

## 2015-08-25 ENCOUNTER — Encounter: Payer: Self-pay | Admitting: Osteopathic Medicine

## 2015-08-25 ENCOUNTER — Ambulatory Visit (INDEPENDENT_AMBULATORY_CARE_PROVIDER_SITE_OTHER): Payer: Medicaid Other | Admitting: Osteopathic Medicine

## 2015-08-25 VITALS — BP 142/74 | HR 93 | Ht 64.0 in | Wt 120.0 lb

## 2015-08-25 DIAGNOSIS — R05 Cough: Secondary | ICD-10-CM | POA: Diagnosis not present

## 2015-08-25 DIAGNOSIS — R059 Cough, unspecified: Secondary | ICD-10-CM

## 2015-08-25 DIAGNOSIS — A499 Bacterial infection, unspecified: Secondary | ICD-10-CM | POA: Diagnosis not present

## 2015-08-25 DIAGNOSIS — B9689 Other specified bacterial agents as the cause of diseases classified elsewhere: Secondary | ICD-10-CM

## 2015-08-25 DIAGNOSIS — J329 Chronic sinusitis, unspecified: Secondary | ICD-10-CM

## 2015-08-25 MED ORDER — CEFDINIR 300 MG PO CAPS: 300.0000 mg | ORAL_CAPSULE | Freq: Two times a day (BID) | ORAL | Status: DC

## 2015-08-25 MED ORDER — BENZONATATE 200 MG PO CAPS: 200.0000 mg | ORAL_CAPSULE | Freq: Three times a day (TID) | ORAL | Status: DC | PRN

## 2015-08-25 MED ORDER — IPRATROPIUM BROMIDE 0.03 % NA SOLN: 2.0000 | Freq: Two times a day (BID) | NASAL | Status: DC

## 2015-08-25 NOTE — Progress Notes (Signed)
HPI: Rachael Douglas is a 59 y.o. female who [facility]  today for chief complaint of:  Chief Complaint  Patient presents with  . Sinus Problem    . Location: sinuses . Quality: pressure . Severity: severe . Duration: 7 days . Context: yes sick contacts, no recent travel . Modifying factors: has tried the following OTC medications: none without relief . Assoc signs/symptoms: no fever/chills, no productive cough, Yes  body aches, No  GI upset   Past medical, social and family history reviewed: Past Medical History  Diagnosis Date  . Heart disease   . Thyroid disease   . Arthritis    Past Surgical History  Procedure Laterality Date  . Total abdominal hysterectomy    . Aortic valve replacement     Social History  Substance Use Topics  . Smoking status: Former Smoker    Types: Cigarettes    Quit date: 05/30/2013  . Smokeless tobacco: Never Used  . Alcohol Use: No   Family History  Problem Relation Age of Onset  . Cancer    . Depression    . Diabetes    . Hypertension      Current Outpatient Prescriptions  Medication Sig Dispense Refill  . albuterol (PROAIR HFA) 108 (90 BASE) MCG/ACT inhaler INHALE 2 PUFFS EVERY 6 HOURS AS NEEDED    . ALPRAZolam (XANAX) 1 MG tablet One by mouth every night and no more than one during the day only as needed for anxiety.  Need follow up appointment for more refills. 60 tablet 0  . aspirin EC 325 MG tablet TAKE 1 TABLET DAILY    . cyclobenzaprine (FLEXERIL) 10 MG tablet Take 1 tablet (10 mg total) by mouth 2 (two) times daily as needed for muscle spasms. 90 tablet 1  . DULoxetine (CYMBALTA) 60 MG capsule Take 1 capsule (60 mg total) by mouth daily.  3  . furosemide (LASIX) 40 MG tablet Take 1 tablet (40 mg total) by mouth daily as needed for edema. 90 tablet 1  . gabapentin (NEURONTIN) 600 MG tablet TAKE 2 TABLETS THREE TIMES A DAY    . lansoprazole (PREVACID) 30 MG capsule TAKE 1 CAPSULE  DAILY    . levothyroxine (SYNTHROID, LEVOTHROID) 75 MCG tablet Take 1 tablet (75 mcg total) by mouth daily before breakfast. 90 tablet 3  . Linaclotide (LINZESS) 145 MCG CAPS capsule Take 1 capsule (145 mcg total) by mouth daily. 90 capsule 1  . meloxicam (MOBIC) 7.5 MG tablet Take 1 tablet (7.5 mg total) by mouth daily as needed for pain. 90 tablet 3  . metoprolol succinate (TOPROL-XL) 25 MG 24 hr tablet Take 25 mg by mouth daily. Take 1/2 in the am and 1/2 in the pm    . mometasone-formoterol (DULERA) 200-5 MCG/ACT AERO Inhale 1 puff into the lungs daily. 3 Inhaler 3  . montelukast (SINGULAIR) 10 MG tablet Take 1 tablet (10 mg total) by mouth at bedtime. 90 tablet 1  . oxymorphone (OPANA) 5 MG tablet Take 5 mg by mouth every 6 (six) hours as needed for pain.    . pravastatin (PRAVACHOL) 80 MG tablet Take 1 tablet (80 mg total) by mouth daily. 90 tablet 3  . risedronate (ACTONEL) 35 MG tablet Take 1 tablet (35 mg total) by mouth every 7 (seven) days. with water on empty stomach, nothing by mouth or lie down for next 30 minutes. 12 tablet 3  . sertraline (ZOLOFT) 100 MG tablet Take total  of  . 60 tablet 0  . triamcinolone cream (KENALOG) 0.1 % APPLY TO THE AFFECTED AREAS DAILY AS NEEDED    . zolpidem (AMBIEN) 5 MG tablet Take 1 tablet (5 mg total) by mouth at bedtime as needed for sleep. 30 tablet 1   No current facility-administered medications for this visit.   Allergies  Allergen Reactions  . Alendronate Other (See Comments)    unknown  . Penicillins Swelling  . Ranitidine Other (See Comments)    Facial swelling      Review of Systems: CONSTITUTIONAL: no fever/chills HEAD/EYES/EARS/NOSE/THROAT: yes headache, no vision change or hearing change, yes sore throat CARDIAC: No chest pain/pressure/palpitations, no orthopnea RESPIRATORY: yes cough, no shortness of breath GASTROINTESTINAL: no nausea, no vomiting, no abdominal pain/blood in stool/diarrhea/constipation MUSCULOSKELETAL:  yes myalgia/arthralgia   Exam:  BP 142/74 mmHg  Pulse 93  Ht  (1.626 m)  Wt 120 lb (54.432 kg)  BMI 20.59 kg/m2 Constitutional: VSS, see above. General Appearance: alert, well-developed, well-nourished, NAD Eyes: Normal lids and conjunctive, non-icteric sclera, PERRLA Ears, Nose, Mouth, Throat: Normal external inspection ears/nares/mouth/lips/gums, normal TM, MMM; posterior pharynx with erythema, without exudate Neck: No masses, trachea midline. No thyroid enlargement/tenderness/mass appreciated, normal lymph nodes Respiratory: Normal respiratory effort. No  wheeze/rhonchi/rales Cardiovascular: S1/S2 normal, no murmur/rub/gallop auscultated. RRR. No carotid bruit or JVD. No lower extremity edema.   No results found for this or any previous visit (from the past 72 hour(s)).    ASSESSMENT/PLAN:  Bacterial sinusitis - Plan: ipratropium (ATROVENT) 0.03 % nasal spray, cefdinir (OMNICEF) 300 MG capsule  Cough - Plan: benzonatate (TESSALON) 200 MG capsule    Return if symptoms worsen or fail to improve.

## 2015-10-07 ENCOUNTER — Ambulatory Visit (INDEPENDENT_AMBULATORY_CARE_PROVIDER_SITE_OTHER): Payer: Medicaid Other | Admitting: Family Medicine

## 2015-10-07 ENCOUNTER — Encounter: Payer: Self-pay | Admitting: Family Medicine

## 2015-10-07 VITALS — BP 129/72 | HR 82 | Wt 119.0 lb

## 2015-10-07 DIAGNOSIS — Z23 Encounter for immunization: Secondary | ICD-10-CM

## 2015-10-07 DIAGNOSIS — K219 Gastro-esophageal reflux disease without esophagitis: Secondary | ICD-10-CM

## 2015-10-07 DIAGNOSIS — F411 Generalized anxiety disorder: Secondary | ICD-10-CM

## 2015-10-07 MED ORDER — SERTRALINE HCL 100 MG PO TABS: 200.0000 mg | ORAL_TABLET | Freq: Every day | ORAL | Status: DC

## 2015-10-07 MED ORDER — ALPRAZOLAM 1 MG PO TABS: ORAL_TABLET | ORAL | Status: DC

## 2015-10-07 MED ORDER — DULOXETINE HCL 60 MG PO CPEP: 60.0000 mg | ORAL_CAPSULE | Freq: Every day | ORAL | Status: DC

## 2015-10-07 MED ORDER — LANSOPRAZOLE 30 MG PO CPDR: 30.0000 mg | DELAYED_RELEASE_CAPSULE | Freq: Every day | ORAL | Status: DC

## 2015-10-07 NOTE — Progress Notes (Signed)
CC: Rachael Douglas is a 60 y.o. female is here for Depression and Anxiety   Subjective: HPI:  Follow-up anxiety: taking Xanax 1-2 times a day she believes it's helping somewhat. Also taking Cymbalta 60 mg daily. On a daily basis she experiences moderate to severe anxiety. Symptoms are worse with recent divorce from her ex-husband and a recent death in her family. Nothing seems to make symptoms better or worse. Symptoms are present all hours of the day. She also tells me that she feels depressed due to the above happenings in her life. She denies thoughts of going to harm herself or others. She was once on Zoloft and this helped with the depressive and anxiety symptoms that she is experiencing and she wants to know she is a candidate to restart this medication.  She is requesting a refill onPrevacid. As long as she takes this on a daily basis she does not experience her old epigastric discomfort and reflux symptoms. She denies any known side effects.   Review Of Systems Outlined In HPI  Past Medical History  Diagnosis Date  . Heart disease   . Thyroid disease   . Arthritis     Past Surgical History  Procedure Laterality Date  . Total abdominal hysterectomy    . Aortic valve replacement     Family History  Problem Relation Age of Onset  . Cancer    . Depression    . Diabetes    . Hypertension      Social History   Social History  . Marital Status: Married    Spouse Name: N/A  . Number of Children: N/A  . Years of Education: N/A   Occupational History  . Not on file.   Social History Main Topics  . Smoking status: Former Smoker    Types: Cigarettes    Quit date: 05/30/2013  . Smokeless tobacco: Never Used  . Alcohol Use: No  . Drug Use: No  . Sexual Activity: Not Currently   Other Topics Concern  . Not on file   Social History Narrative     Objective: BP 129/72 mmHg  Pulse 82  Wt 119 lb (53.978 kg)  Vital signs reviewed. General: Alert and Oriented, No  Acute Distress HEENT: Pupils equal, round, reactive to light. Conjunctivae clear.  External ears unremarkable.  Moist mucous membranes. Lungs: Clear and comfortable work of breathing, speaking in full sentences without accessory muscle use. Cardiac: Regular rate and rhythm.  Neuro: CN II-XII grossly intact, gait normal. Extremities: No peripheral edema.  Strong peripheral pulses.  Mental Status: mild depression and anxiety. no agitation. Logical though process. Skin: Warm and dry.  Assessment & Plan: [name] was seen today for depression and anxiety.  Diagnoses and all orders for this visit:  Generalized anxiety disorder -     ALPRAZolam (XANAX) 1 MG tablet; One by mouth every night and no more than one during the day only as needed for anxiety. -     DULoxetine (CYMBALTA) 60 MG capsule; Take 1 capsule (60 mg total) by mouth daily. -     sertraline (ZOLOFT) 100 MG tablet; Take 2 tablets (200 mg total) by mouth daily.  Gastroesophageal reflux disease without esophagitis -     lansoprazole (PREVACID) 30 MG capsule; Take 1 capsule (30 mg total) by mouth daily. TAKE 1 CAPSULE DAILY   GAD: uncontrolled chronic condition adding Zoloft to Cymbalta if no better after 2 weeks will switch Zoloft for Lexapro. Continue as needed Xanax G GERD:  controlled with Prevacid.  25 minutes spent face-to-face during visit today of which at least 50% was counseling or coordinating care regarding: 1. Generalized anxiety disorder   2. Gastroesophageal reflux disease without esophagitis      Return in about 4 weeks (around 11/04/2015) for Mood.

## 2015-10-08 ENCOUNTER — Telehealth: Payer: Self-pay | Admitting: Family Medicine

## 2015-10-08 NOTE — Telephone Encounter (Signed)
Received fax for prior authorization on Lansoprazole sent through cover my meds waiting on authorization. - CF

## 2015-10-20 ENCOUNTER — Telehealth: Payer: Self-pay | Admitting: Family Medicine

## 2015-10-20 MED ORDER — PANTOPRAZOLE SODIUM 40 MG PO TBEC: 40.0000 mg | DELAYED_RELEASE_TABLET | Freq: Every day | ORAL | Status: DC

## 2015-10-20 NOTE — Telephone Encounter (Signed)
Will you please let patient know that her insurance contacted me about no longer covering lansoprazole.  Fortunately it looks like they will cover an alternative called pantoprazole which should work just as good.  I'll send a new Rx to her [facility].

## 2015-10-20 NOTE — Telephone Encounter (Signed)
Pt.notified

## 2015-11-12 NOTE — Telephone Encounter (Signed)
Medication no longer covered new prescription was sent in for patient. - CF

## 2015-11-13 ENCOUNTER — Ambulatory Visit (INDEPENDENT_AMBULATORY_CARE_PROVIDER_SITE_OTHER): Payer: Medicaid Other | Admitting: Family Medicine

## 2015-11-13 ENCOUNTER — Encounter: Payer: Self-pay | Admitting: Family Medicine

## 2015-11-13 VITALS — BP 134/85 | HR 96 | Wt 119.0 lb

## 2015-11-13 DIAGNOSIS — R61 Generalized hyperhidrosis: Secondary | ICD-10-CM

## 2015-11-13 DIAGNOSIS — F332 Major depressive disorder, recurrent severe without psychotic features: Secondary | ICD-10-CM | POA: Diagnosis not present

## 2015-11-13 DIAGNOSIS — T8112XA Postprocedural septic shock, initial encounter: Secondary | ICD-10-CM

## 2015-11-13 DIAGNOSIS — T819XXA Unspecified complication of procedure, initial encounter: Secondary | ICD-10-CM | POA: Insufficient documentation

## 2015-11-13 DIAGNOSIS — F411 Generalized anxiety disorder: Secondary | ICD-10-CM

## 2015-11-13 MED ORDER — ALPRAZOLAM 1 MG PO TABS: ORAL_TABLET | ORAL | Status: DC

## 2015-11-13 MED ORDER — BREXPIPRAZOLE 2 MG PO TABS: 2.0000 mg | ORAL_TABLET | Freq: Every day | ORAL | Status: DC

## 2015-11-13 NOTE — Progress Notes (Addendum)
CC: Rachael Douglas is a 60 y.o. female is here for Depression   Subjective: HPI:   She tells me that she's  Not feeling Zoloft provided her with any benefit. She is anxious on a daily basis and feels depressed to the point where she really wants to leave the house. She has  A bleak outlook on life but has no thoughts of harm. 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 She feels like she is putting on a fake act around others, try to project to others that she is fine and happy however on the inside she is miserable.symptoms have been present on a daily basis for well over 3 months now.  She saw a commercial for Rexulti and wants to know if she can try it.  She recently got a letter from [employer] stating that when she had her open heart surgery she may have been a patient that was exposed to Mycobacterium. She tells me ever since this surgery she's been experiencing unintentional weight loss and occasionally has a night sweats. Denies cough, wheezing, shortness of breath, or chest discomfort   Review Of Systems Outlined In HPI  Past Medical History  Diagnosis Date  . Heart disease   . Thyroid disease   . Arthritis     Past Surgical History  Procedure Laterality Date  . Total abdominal hysterectomy    . Aortic valve replacement     Family History  Problem Relation Age of Onset  . Cancer    . Depression    . Diabetes    . Hypertension      Social History   Social History  . Marital Status: Married    Spouse Name: N/A  . Number of Children: N/A  . Years of Education: N/A   Occupational History  . Not on file.   Social History Main Topics  . Smoking status: Former Smoker    Types: Cigarettes    Quit date: 05/30/2013  . Smokeless tobacco: Never Used  . Alcohol Use: No  . Drug Use: No  . Sexual Activity: Not Currently   Other Topics Concern  . Not on file   Social History Narrative     Objective: BP 134/85 mmHg  Pulse 96  Wt 119 lb  (53.978 kg)  Vital signs reviewed. General: Alert and Oriented, No Acute Distress HEENT: Pupils equal, round, reactive to light. Conjunctivae clear.  External ears unremarkable.  Moist mucous membranes. Lungs: Clear and comfortable work of breathing, speaking in full sentences without accessory muscle use. Cardiac: Regular rate and rhythm.  Neuro: CN II-XII grossly intact, gait normal. Extremities: No peripheral edema.  Strong peripheral pulses.  Mental Status: No depression, anxiety, nor agitation. Logical though process. Skin: Warm and dry.  Assessment & Plan: Latoria was seen today for depression.  Diagnoses and all orders for this visit:  Generalized anxiety disorder -     ALPRAZolam (XANAX) 1 MG tablet; One by mouth every night and no more than one during the day only as needed for anxiety.  Postoperative septic shock, initial encounter (HCC)  Severe episode of recurrent major depressive disorder, without psychotic features (HCC) -     Brexpiprazole (REXULTI) 2 MG TABS; Take 2 mg by mouth daily.  Night sweats -     Blood culture (routine single)  anxiety depression: Uncontrolled chronic condition, stopping Zoloft, continue Cymbalta and Xanax. Starting Rexulti if insurance will cover it. Confirmed that she's aware of the most  common side effects, she's not deterred.  Night sweats: Seems unlikely that she would have a mycobacterium infection, paperwork sent to her stated that over 4000 patients that were at risk for this never experienced any symptoms of infection. For a more concrete grasp of the situation will order blood culture.   Return if symptoms worsen or fail to improve.

## 2015-11-19 ENCOUNTER — Telehealth: Payer: Self-pay

## 2015-11-19 NOTE — Telephone Encounter (Signed)
Patient states she was supposed to get a change in her thyroid medication if blood work was normal. Please advise.

## 2015-11-19 NOTE — Telephone Encounter (Signed)
Rexulti needs a prior authorization.

## 2015-11-20 LAB — CULTURE, BLOOD (SINGLE): ORGANISM ID, BACTERIA: NO GROWTH

## 2015-11-20 NOTE — Telephone Encounter (Signed)
Prior auth [facility] [employer] [facility] Approval # [id number] ID # [id number]  Pt notified and pharmacy notified

## 2015-11-20 NOTE — Telephone Encounter (Signed)
Thyroid supplement appears adequately dosed. No need to change supplement dose.

## 2015-11-20 NOTE — Telephone Encounter (Signed)
Informed patient that medication is dosed adequately.

## 2015-12-08 ENCOUNTER — Other Ambulatory Visit: Payer: Self-pay

## 2015-12-08 MED ORDER — LINACLOTIDE 145 MCG PO CAPS: 145.0000 ug | ORAL_CAPSULE | Freq: Every day | ORAL | Status: DC

## 2016-01-05 ENCOUNTER — Telehealth: Payer: Self-pay | Admitting: Family Medicine

## 2016-01-05 DIAGNOSIS — IMO0001 Reserved for inherently not codable concepts without codable children: Secondary | ICD-10-CM

## 2016-01-05 MED ORDER — ESTROGENS, CONJUGATED 0.625 MG/GM VA CREA: TOPICAL_CREAM | VAGINAL | Status: DC

## 2016-01-05 NOTE — Telephone Encounter (Signed)
Requested premarin while she was accompanying her new husband during his office visit

## 2016-01-12 ENCOUNTER — Other Ambulatory Visit: Payer: Self-pay

## 2016-01-12 DIAGNOSIS — F411 Generalized anxiety disorder: Secondary | ICD-10-CM

## 2016-01-12 MED ORDER — ALPRAZOLAM 1 MG PO TABS: ORAL_TABLET | ORAL | Status: DC

## 2016-01-13 ENCOUNTER — Telehealth: Payer: Self-pay | Admitting: Family Medicine

## 2016-01-13 MED ORDER — CYCLOBENZAPRINE HCL 10 MG PO TABS: 10.0000 mg | ORAL_TABLET | Freq: Three times a day (TID) | ORAL | Status: DC | PRN

## 2016-01-13 NOTE — Telephone Encounter (Signed)
Refill req 

## 2016-02-05 ENCOUNTER — Ambulatory Visit (INDEPENDENT_AMBULATORY_CARE_PROVIDER_SITE_OTHER): Payer: Medicaid Other

## 2016-02-05 ENCOUNTER — Ambulatory Visit (INDEPENDENT_AMBULATORY_CARE_PROVIDER_SITE_OTHER): Payer: Medicaid Other | Admitting: Family Medicine

## 2016-02-05 ENCOUNTER — Encounter: Payer: Self-pay | Admitting: Family Medicine

## 2016-02-05 VITALS — BP 135/74 | HR 86 | Wt 129.0 lb

## 2016-02-05 DIAGNOSIS — M25572 Pain in left ankle and joints of left foot: Secondary | ICD-10-CM

## 2016-02-05 DIAGNOSIS — M79672 Pain in left foot: Secondary | ICD-10-CM

## 2016-02-05 MED ORDER — ASPIRIN EC 325 MG PO TBEC: 325.0000 mg | DELAYED_RELEASE_TABLET | Freq: Every day | ORAL | Status: DC

## 2016-02-05 MED ORDER — MELOXICAM 7.5 MG PO TABS: 7.5000 mg | ORAL_TABLET | Freq: Every day | ORAL | Status: DC | PRN

## 2016-02-05 NOTE — Patient Instructions (Signed)
Thank you for coming in today. Use the cam walker boot as needed.  Take meloxicam and use aspercream for pain.  Apply ice as needed for pain.  Work on foot motion at home.   Cryotherapy Cryotherapy means treatment with cold. Ice or gel packs can be used to reduce both pain and swelling. Ice is the most helpful within the first 24 to 48 hours after an injury or flare-up from overusing a muscle or joint. Sprains, strains, spasms, burning pain, shooting pain, and aches can all be eased with ice. Ice can also be used when recovering from surgery. Ice is effective, has very few side effects, and is safe for most people to use. PRECAUTIONS  Ice is not a safe treatment option for people with:  Raynaud phenomenon. This is a condition affecting small blood vessels in the extremities. Exposure to cold may cause your problems to return.  Cold hypersensitivity. There are many forms of cold hypersensitivity, including:  Cold urticaria. Red, itchy hives appear on the skin when the tissues begin to warm after being iced.  Cold erythema. This is a red, itchy rash caused by exposure to cold.  Cold hemoglobinuria. Red blood cells break down when the tissues begin to warm after being iced. The hemoglobin that carry oxygen are passed into the urine because they cannot combine with blood proteins fast enough.  Numbness or altered sensitivity in the area being iced. If you have any of the following conditions, do not use ice until you have discussed cryotherapy with your caregiver:  Heart conditions, such as arrhythmia, angina, or chronic heart disease.  High blood pressure.  Healing wounds or open skin in the area being iced.  Current infections.  Rheumatoid arthritis.  Poor circulation.  Diabetes. Ice slows the blood flow in the region it is applied. This is beneficial when trying to stop inflamed tissues from spreading irritating chemicals to surrounding tissues. However, if you expose your skin to  cold temperatures for too long or without the proper protection, you can damage your skin or nerves. Watch for signs of skin damage due to cold. HOME CARE INSTRUCTIONS Follow these tips to use ice and cold packs safely.  Place a dry or damp towel between the ice and skin. A damp towel will cool the skin more quickly, so you may need to shorten the time that the ice is used.  For a more rapid response, add gentle compression to the ice.  Ice for no more than 10 to 20 minutes at a time. The bonier the area you are icing, the less time it will take to get the benefits of ice.  Check your skin after 5 minutes to make sure there are no signs of a poor response to cold or skin damage.  Rest 20 minutes or more between uses.  Once your skin is numb, you can end your treatment. You can test numbness by very lightly touching your skin. The touch should be so light that you do not see the skin dimple from the pressure of your fingertip. When using ice, most people will feel these normal sensations in this order: cold, burning, aching, and numbness.  Do not use ice on someone who cannot communicate their responses to pain, such as small children or people with dementia. HOW TO MAKE AN ICE PACK Ice packs are the most common way to use ice therapy. Other methods include ice massage, ice baths, and cryosprays. Muscle creams that cause a cold, tingly feeling do  not offer the same benefits that ice offers and should not be used as a substitute unless recommended by your caregiver. To make an ice pack, do one of the following:  Place crushed ice or a bag of frozen vegetables in a sealable plastic bag. Squeeze out the excess air. Place this bag inside another plastic bag. Slide the bag into a pillowcase or place a damp towel between your skin and the bag.  Mix 3 parts water with 1 part rubbing alcohol. Freeze the mixture in a sealable plastic bag. When you remove the mixture from the freezer, it will be slushy.  Squeeze out the excess air. Place this bag inside another plastic bag. Slide the bag into a pillowcase or place a damp towel between your skin and the bag. SEEK MEDICAL CARE IF:  You develop white spots on your skin. This may give the skin a blotchy (mottled) appearance.  Your skin turns blue or pale.  Your skin becomes waxy or hard.  Your swelling gets worse. MAKE SURE YOU:   Understand these instructions.  Will watch your condition.  Will get help right away if you are not doing well or get worse.   This information is not intended to replace advice given to you by your health care provider. Make sure you discuss any questions you have with your health care provider.   Document Released: 05/02/2011 Document Revised: 09/26/2014 Document Reviewed: 05/02/2011 Elsevier Interactive Patient Education 2016 [employer]   Acute Ankle Sprain With Phase I Rehab An acute ankle sprain is a partial or complete tear in one or more of the ligaments of the ankle due to traumatic injury. The severity of the injury depends on both the number of ligaments sprained and the grade of sprain. There are 3 grades of sprains.   A grade 1 sprain is a mild sprain. There is a slight pull without obvious tearing. There is no loss of strength, and the muscle and ligament are the correct length.  A grade 2 sprain is a moderate sprain. There is tearing of fibers within the substance of the ligament where it connects two bones or two cartilages. The length of the ligament is increased, and there is usually decreased strength.  A grade 3 sprain is a complete rupture of the ligament and is uncommon. In addition to the grade of sprain, there are three types of ankle sprains.  Lateral ankle sprains: This is a sprain of one or more of the three ligaments on the outer side (lateral) of the ankle. These are the most common sprains. Medial ankle sprains: There is one large triangular ligament of the inner side (medial)  of the ankle that is susceptible to injury. Medial ankle sprains are less common. Syndesmosis, "high ankle," sprains: The syndesmosis is the ligament that connects the two bones of the lower leg. Syndesmosis sprains usually only occur with very severe ankle sprains. SYMPTOMS  Pain, tenderness, and swelling in the ankle, starting at the side of injury that may progress to the whole ankle and foot with time.  "Pop" or tearing sensation at the time of injury.  Bruising that may spread to the heel.  Impaired ability to walk soon after injury. CAUSES   Acute ankle sprains are caused by trauma placed on the ankle that temporarily forces or pries the anklebone (talus) out of its normal socket.  Stretching or tearing of the ligaments that normally hold the joint in place (usually due to a twisting injury). RISK INCREASES  WITH:  Previous ankle sprain.  Sports in which the foot may land awkwardly (i.e., basketball, volleyball, or soccer) or walking or running on uneven or rough surfaces.  Shoes with inadequate support to prevent sideways motion when stress occurs.  Poor strength and flexibility.  Poor balance skills.  Contact sports. PREVENTION   Warm up and stretch properly before activity.  Maintain physical fitness:  Ankle and leg flexibility, muscle strength, and endurance.  Cardiovascular fitness.  Balance training activities.  Use proper technique and have a coach correct improper technique.  Taping, protective strapping, bracing, or high-top tennis shoes may help prevent injury. Initially, tape is best; however, it loses most of its support function within 10 to 15 minutes.  Wear proper-fitted protective shoes (High-top shoes with taping or bracing is more effective than either alone).  Provide the ankle with support during sports and practice activities for 12 months following injury. PROGNOSIS   If treated properly, ankle sprains can be expected to recover completely;  however, the length of recovery depends on the degree of injury.  A grade 1 sprain usually heals enough in 5 to 7 days to allow modified activity and requires an average of 6 weeks to heal completely.  A grade 2 sprain requires 6 to 10 weeks to heal completely.  A grade 3 sprain requires 12 to 16 weeks to heal.  A syndesmosis sprain often takes more than 3 months to heal. RELATED COMPLICATIONS   Frequent recurrence of symptoms may result in a chronic problem. Appropriately addressing the problem the first time decreases the frequency of recurrence and optimizes healing time. Severity of the initial sprain does not predict the likelihood of later instability.  Injury to other structures (bone, cartilage, or tendon).  A chronically unstable or arthritic ankle joint is a possibility with repeated sprains. TREATMENT Treatment initially involves the use of ice, medication, and compression bandages to help reduce pain and inflammation. Ankle sprains are usually immobilized in a walking cast or boot to allow for healing. Crutches may be recommended to reduce pressure on the injury. After immobilization, strengthening and stretching exercises may be necessary to regain strength and a full range of motion. Surgery is rarely needed to treat ankle sprains. MEDICATION   Nonsteroidal anti-inflammatory medications, such as aspirin and ibuprofen (do not take for the first 3 days after injury or within 7 days before surgery), or other minor pain relievers, such as acetaminophen, are often recommended. Take these as directed by your caregiver. Contact your caregiver immediately if any bleeding, stomach upset, or signs of an allergic reaction occur from these medications.  Ointments applied to the skin may be helpful.  Pain relievers may be prescribed as necessary by your caregiver. Do not take prescription pain medication for longer than 4 to 7 days. Use only as directed and only as much as you need. HEAT  AND COLD  Cold treatment (icing) is used to relieve pain and reduce inflammation for acute and chronic cases. Cold should be applied for 10 to 15 minutes every 2 to 3 hours for inflammation and pain and immediately after any activity that aggravates your symptoms. Use ice packs or an ice massage.  Heat treatment may be used before performing stretching and strengthening activities prescribed by your caregiver. Use a heat pack or a warm soak. SEEK IMMEDIATE MEDICAL CARE IF:   Pain, swelling, or bruising worsens despite treatment.  You experience pain, numbness, discoloration, or coldness in the foot or toes.  New, unexplained symptoms develop (  drugs used in treatment may produce side effects.) EXERCISES  PHASE I EXERCISES RANGE OF MOTION (ROM) AND STRETCHING EXERCISES - Ankle Sprain, Acute Phase I, Weeks 1 to 2 These exercises may help you when beginning to restore flexibility in your ankle. You will likely work on these exercises for the 1 to 2 weeks after your injury. Once your physician, physical therapist, or athletic trainer sees adequate progress, he or she will advance your exercises. While completing these exercises, remember:   Restoring tissue flexibility helps normal motion to return to the joints. This allows healthier, less painful movement and activity.  An effective stretch should be held for at least 30 seconds.  A stretch should never be painful. You should only feel a gentle lengthening or release in the stretched tissue. RANGE OF MOTION - Dorsi/Plantar Flexion  While sitting with your right / left knee straight, draw the top of your foot upwards by flexing your ankle. Then reverse the motion, pointing your toes downward.  Hold each position for __________ seconds.  After completing your first set of exercises, repeat this exercise with your knee bent. Repeat __________ times. Complete this exercise __________ times per day.  RANGE OF MOTION - Ankle Alphabet  Imagine  your right / left big toe is a pen.  Keeping your hip and knee still, write out the entire alphabet with your "pen." Make the letters as large as you can without increasing any discomfort. Repeat __________ times. Complete this exercise __________ times per day.  STRENGTHENING EXERCISES - Ankle Sprain, Acute -Phase I, Weeks 1 to 2 These exercises may help you when beginning to restore strength in your ankle. You will likely work on these exercises for 1 to 2 weeks after your injury. Once your physician, physical therapist, or athletic trainer sees adequate progress, he or she will advance your exercises. While completing these exercises, remember:   Muscles can gain both the endurance and the strength needed for everyday activities through controlled exercises.  Complete these exercises as instructed by your physician, physical therapist, or athletic trainer. Progress the resistance and repetitions only as guided.  You may experience muscle soreness or fatigue, but the pain or discomfort you are trying to eliminate should never worsen during these exercises. If this pain does worsen, stop and make certain you are following the directions exactly. If the pain is still present after adjustments, discontinue the exercise until you can discuss the trouble with your clinician. STRENGTH - Dorsiflexors  Secure a rubber exercise band/tubing to a fixed object (i.e., table, pole) and loop the other end around your right / left foot.  Sit on the floor facing the fixed object. The band/tubing should be slightly tense when your foot is relaxed.  Slowly draw your foot back toward you using your ankle and toes.  Hold this position for __________ seconds. Slowly release the tension in the band and return your foot to the starting position. Repeat __________ times. Complete this exercise __________ times per day.  STRENGTH - Plantar-flexors   Sit with your right / left leg extended. Holding onto both ends of  a rubber exercise band/tubing, loop it around the ball of your foot. Keep a slight tension in the band.  Slowly push your toes away from you, pointing them downward.  Hold this position for __________ seconds. Return slowly, controlling the tension in the band/tubing. Repeat __________ times. Complete this exercise __________ times per day.  STRENGTH - Ankle Eversion  Secure one end of a rubber  exercise band/tubing to a fixed object (table, pole). Loop the other end around your foot just before your toes.  Place your fists between your knees. This will focus your strengthening at your ankle.  Drawing the band/tubing across your opposite foot, slowly, pull your little toe out and up. Make sure the band/tubing is positioned to resist the entire motion.  Hold this position for __________ seconds. Have your muscles resist the band/tubing as it slowly pulls your foot back to the starting position.  Repeat __________ times. Complete this exercise __________ times per day.  STRENGTH - Ankle Inversion  Secure one end of a rubber exercise band/tubing to a fixed object (table, pole). Loop the other end around your foot just before your toes.  Place your fists between your knees. This will focus your strengthening at your ankle.  Slowly, pull your big toe up and in, making sure the band/tubing is positioned to resist the entire motion.  Hold this position for __________ seconds.  Have your muscles resist the band/tubing as it slowly pulls your foot back to the starting position. Repeat __________ times. Complete this exercises __________ times per day.  STRENGTH - Towel Curls  Sit in a chair positioned on a non-carpeted surface.  Place your right / left foot on a towel, keeping your heel on the floor.  Pull the towel toward your heel by only curling your toes. Keep your heel on the floor.  If instructed by your physician, physical therapist, or athletic trainer, add weight to the end of the  towel. Repeat __________ times. Complete this exercise __________ times per day.   This information is not intended to replace advice given to you by your health care provider. Make sure you discuss any questions you have with your health care provider.   Document Released: 04/06/2005 Document Revised: 09/26/2014 Document Reviewed: 12/18/2008 Elsevier Interactive Patient Education [employer]

## 2016-02-05 NOTE — Progress Notes (Signed)
Rachael Douglas is a 60 y.o. female who [facility] today for left foot and ankle pain. Patient suffered an inversion injury to her left foot and ankle approximately 10 days ago. She notes pain and swelling on the lateral midfoot and to the lateral ankle. She is able to walk but with mild pain. She takes meloxicam for pain which helps a little. Otherwise she feels well.    Past Medical History  Diagnosis Date  . Heart disease   . Thyroid disease   . Arthritis    Past Surgical History  Procedure Laterality Date  . Total abdominal hysterectomy    . Aortic valve replacement     Social History  Substance Use Topics  . Smoking status: Former Smoker    Types: Cigarettes    Quit date: 05/30/2013  . Smokeless tobacco: Never Used  . Alcohol Use: No   family history is not on file.  ROS:  No headache, visual changes, nausea, vomiting, diarrhea, constipation, dizziness, abdominal pain, skin rash, fevers, chills, night sweats, weight loss, swollen lymph nodes, body aches, joint swelling, muscle aches, chest pain, shortness of breath, mood changes, visual or auditory hallucinations.    Medications: Current Outpatient Prescriptions  Medication Sig Dispense Refill  . albuterol (PROAIR HFA) 108 (90 BASE) MCG/ACT inhaler INHALE 2 PUFFS EVERY 6 HOURS AS NEEDED    . ALPRAZolam (XANAX) 1 MG tablet One by mouth every night and no more than one during the day only as needed for anxiety. 60 tablet 0  . aspirin EC 325 MG tablet Take 1 tablet (325 mg total) by mouth daily. TAKE 1 TABLET DAILY 90 tablet 2  . Brexpiprazole (REXULTI) 2 MG TABS Take 2 mg by mouth daily. 30 tablet 11  . conjugated estrogens (PREMARIN) vaginal cream 0.5g applied in the vagina twice a week. 42.5 g 12  . cyclobenzaprine (FLEXERIL) 10 MG tablet Take 1 tablet (10 mg total) by mouth 3 (three) times daily as needed for muscle spasms. 90 tablet 11  . DULoxetine (CYMBALTA)  60 MG capsule Take 1 capsule (60 mg total) by mouth daily. 90 capsule 1  . furosemide (LASIX) 40 MG tablet Take 1 tablet (40 mg total) by mouth daily as needed for edema. 90 tablet 1  . gabapentin (NEURONTIN) 600 MG tablet Take 600 mg by mouth 3 (three) times daily as needed. TAKE 2 TABLETS THREE TIMES A DAY    . ipratropium (ATROVENT) 0.03 % nasal spray Place 2 sprays into both nostrils every 12 (twelve) hours. 30 mL 1  . levothyroxine (SYNTHROID, LEVOTHROID) 75 MCG tablet Take 1 tablet (75 mcg total) by mouth daily before breakfast. 90 tablet 3  . Linaclotide (LINZESS) 145 MCG CAPS capsule Take 1 capsule (145 mcg total) by mouth daily. 90 capsule 1  . meloxicam (MOBIC) 7.5 MG tablet Take 1-2 tablets (7.5-15 mg total) by mouth daily as needed for pain. 180 tablet 3  . metoprolol succinate (TOPROL-XL) 25 MG 24 hr tablet Take 25 mg by mouth daily. Take 1/2 in the am and 1/2 in the pm    . mometasone-formoterol (DULERA) 200-5 MCG/ACT AERO Inhale 1 puff into the lungs daily. 3 Inhaler 3  . montelukast (SINGULAIR) 10 MG tablet Take 1 tablet (10 mg total) by mouth at bedtime. 90 tablet 1  . oxymorphone (OPANA) 5 MG tablet Take 5 mg by mouth every 6 (six) hours as needed for pain.    . pantoprazole (PROTONIX) 40  MG tablet Take 1 tablet (40 mg total) by mouth daily. 30 tablet 3  . pravastatin (PRAVACHOL) 80 MG tablet Take 1 tablet (80 mg total) by mouth daily. 90 tablet 3  . risedronate (ACTONEL) 35 MG tablet Take 1 tablet (35 mg total) by mouth every 7 (seven) days. with water on empty stomach, nothing by mouth or lie down for next 30 minutes. 12 tablet 3  . triamcinolone cream (KENALOG) 0.1 % APPLY TO THE AFFECTED AREAS DAILY AS NEEDED    . zolpidem (AMBIEN) 5 MG tablet Take 1 tablet (5 mg total) by mouth at bedtime as needed for sleep. 30 tablet 1   No current facility-administered medications for this visit.   Allergies  Allergen Reactions  . Alendronate Other (See Comments)    unknown  .  Penicillins Swelling  . Ranitidine Other (See Comments)    Facial swelling     Exam:  BP 135/74 mmHg  Pulse 86  Wt 129 lb (58.514 kg) General: Well Developed, well nourished, and in no acute distress.  Neuro/Psych: Alert and oriented x3, extra-ocular muscles intact, able to move all 4 extremities, sensation grossly intact. Skin: Warm and dry, no rashes noted.  Respiratory: Not using accessory muscles, speaking in full sentences, trachea midline.  Cardiovascular: Pulses palpable, no extremity edema. Abdomen: Does not appear distended. MSK: Left foot and ankle swollen and tender along the midfoot laterally. She has ecchymosis at the plantar lateral aspect of her foot. Pulses capillary refill and sensation are intact. Stable ligamentous exam but pain with talar tilt and anterior drawer.   X-ray left foot and ankle: No apparent fractures. Mild DJD seen Awaiting formal radiology review   No results found for this or any previous visit (from the past 24 hour(s)). No results found.   60 year old woman with probable ankle sprain. Plan for cam walker boot. Additionally his home exercise program meloxicam and Aspercreme for pain control. Recheck in 2 weeks.

## 2016-02-06 NOTE — Progress Notes (Signed)
Quick Note:  Normal, no changes. ______ 

## 2016-02-19 ENCOUNTER — Telehealth: Payer: Self-pay

## 2016-02-19 MED ORDER — LINACLOTIDE 290 MCG PO CAPS: 290.0000 ug | ORAL_CAPSULE | Freq: Every day | ORAL | Status: DC

## 2016-02-19 NOTE — Telephone Encounter (Signed)
New dose of 290mg  sent to her pharmacy.

## 2016-02-19 NOTE — Telephone Encounter (Signed)
Rachael Douglas states her pain medication has been increased. She states because of the increase in pain medication she has had to take Linzess twice a day. She would like an increase in the Linzess. Please advise.

## 2016-02-19 NOTE — Telephone Encounter (Signed)
Patient advised.

## 2016-02-29 ENCOUNTER — Other Ambulatory Visit: Payer: Self-pay | Admitting: Family Medicine

## 2016-03-01 ENCOUNTER — Other Ambulatory Visit: Payer: Self-pay | Admitting: Family Medicine

## 2016-03-04 ENCOUNTER — Other Ambulatory Visit: Payer: Self-pay | Admitting: Family Medicine

## 2016-03-07 ENCOUNTER — Telehealth: Payer: Self-pay

## 2016-03-07 MED ORDER — TRIAMCINOLONE ACETONIDE 0.1 % EX CREA: TOPICAL_CREAM | CUTANEOUS | Status: DC

## 2016-03-07 MED ORDER — FUROSEMIDE 40 MG PO TABS: 40.0000 mg | ORAL_TABLET | Freq: Every day | ORAL | Status: DC | PRN

## 2016-03-07 NOTE — Telephone Encounter (Signed)
Rachael Douglas called and wants a refill on triamcinolone 80 grams for her legs for razor burn. This is a historical medication.

## 2016-03-07 NOTE — Telephone Encounter (Signed)
Patient aware.

## 2016-03-07 NOTE — Telephone Encounter (Signed)
Raylene EvertsAngela, I'm OK with this, Rx sent to her [facility] on [address].

## 2016-03-11 ENCOUNTER — Ambulatory Visit (INDEPENDENT_AMBULATORY_CARE_PROVIDER_SITE_OTHER): Payer: Medicaid Other | Admitting: Family Medicine

## 2016-03-11 ENCOUNTER — Encounter: Payer: Self-pay | Admitting: Family Medicine

## 2016-03-11 VITALS — BP 120/75 | HR 72 | Wt 131.0 lb

## 2016-03-11 DIAGNOSIS — F419 Anxiety disorder, unspecified: Principal | ICD-10-CM

## 2016-03-11 DIAGNOSIS — F32A Depression, unspecified: Secondary | ICD-10-CM

## 2016-03-11 DIAGNOSIS — F329 Major depressive disorder, single episode, unspecified: Secondary | ICD-10-CM

## 2016-03-11 DIAGNOSIS — R42 Dizziness and giddiness: Secondary | ICD-10-CM

## 2016-03-11 DIAGNOSIS — F418 Other specified anxiety disorders: Secondary | ICD-10-CM | POA: Diagnosis not present

## 2016-03-11 DIAGNOSIS — G4700 Insomnia, unspecified: Secondary | ICD-10-CM

## 2016-03-11 LAB — COMPREHENSIVE METABOLIC PANEL
ALBUMIN: 4.2 g/dL (ref 3.6–5.1)
ALT: 12 U/L (ref 6–29)
AST: 17 U/L (ref 10–35)
Alkaline Phosphatase: 82 U/L (ref 33–130)
BILIRUBIN TOTAL: 0.4 mg/dL (ref 0.2–1.2)
BUN: 14 mg/dL (ref 7–25)
CO2: 30 mmol/L (ref 20–31)
CREATININE: 1.21 mg/dL — AB (ref 0.50–0.99)
Calcium: 8.8 mg/dL (ref 8.6–10.4)
Chloride: 98 mmol/L (ref 98–110)
Glucose, Bld: 122 mg/dL — ABNORMAL HIGH (ref 65–99)
Potassium: 4.5 mmol/L (ref 3.5–5.3)
SODIUM: 138 mmol/L (ref 135–146)
Total Protein: 6.8 g/dL (ref 6.1–8.1)

## 2016-03-11 MED ORDER — BREXPIPRAZOLE 3 MG PO TABS: 3.0000 mg | ORAL_TABLET | Freq: Every day | ORAL | Status: DC

## 2016-03-11 MED ORDER — ZOLPIDEM TARTRATE 5 MG PO TABS: 5.0000 mg | ORAL_TABLET | Freq: Every evening | ORAL | Status: DC | PRN

## 2016-03-11 MED ORDER — GABAPENTIN 600 MG PO TABS: 1200.0000 mg | ORAL_TABLET | Freq: Three times a day (TID) | ORAL | Status: DC

## 2016-03-11 NOTE — Progress Notes (Signed)
CC: Rachael Douglas is a 60 y.o. female is here for No chief complaint on file.   Subjective: HPI:  Follow-up anxiety and depression: Since startingRexulti she's felt night and day difference with respect to depression. She denies any subjective depression, loss of interest in hobbies or appetite change. She believes medications working great without any side effects. She continues to take Cymbalta as well.  Ever since getting out of bed this morning she's felt dizzy when she goes from a seated to a standing position. She told me she literally couldn't get one second of sleep last night due to reason she can't identify. She denies any anxiety or pain was keeping her awake. She just can't fall sleep. She ate breakfast this morning denies any appetite changes. She tells her she self discontinued metoprolol many months ago due to hypotension. She denies any other motor or sensory disturbances that lasts only a brief few seconds. Denies fevers, chills, cough, wheezing or weakness  She needs her gabapentin prescription in my name instead of her prior clinic.   Review Of Systems Outlined In HPI  Past Medical History  Diagnosis Date  . Heart disease   . Thyroid disease   . Arthritis     Past Surgical History  Procedure Laterality Date  . Total abdominal hysterectomy    . Aortic valve replacement     Family History  Problem Relation Age of Onset  . Cancer    . Depression    . Diabetes    . Hypertension      Social History   Social History  . Marital Status: Married    Spouse Name: N/A  . Number of Children: N/A  . Years of Education: N/A   Occupational History  . Not on file.   Social History Main Topics  . Smoking status: Former Smoker    Types: Cigarettes    Quit date: 05/30/2013  . Smokeless tobacco: Never Used  . Alcohol Use: No  . Drug Use: No  . Sexual Activity: Not Currently   Other Topics Concern  . Not on file   Social History Narrative     Objective: BP  120/75 mmHg  Pulse 72  Wt 131 lb (59.421 kg)  General: Alert and Oriented, No Acute Distress HEENT: Pupils equal, round, reactive to light. Conjunctivae clear. Moist membranes Lungs: Clear to auscultation bilaterally, no wheezing/ronchi/rales.  Comfortable work of breathing. Good air movement. Cardiac: Regular rate and rhythm. Mechanical S2 with grade 1/6 holosystolic murmur in the left second intercostal space Neuro: Cranial nerves II-12 grossly intact Extremities: No peripheral edema.  Strong peripheral pulses.  Mental Status: No depression, anxiety, nor agitation. Skin: Warm and dry.  Assessment & Plan: Diagnoses and all orders for this visit:  Anxiety and depression -     Brexpiprazole (REXULTI) 3 MG TABS; Take 3 mg by mouth daily.  Dizziness -     Comprehensive Metabolic Panel (CMET)  Other orders -     gabapentin (NEURONTIN) 600 MG tablet; Take 2 tablets (1,200 mg total) by mouth 3 (three) times daily. -     zolpidem (AMBIEN) 5 MG tablet; Take 1 tablet (5 mg total) by mouth at bedtime as needed for sleep.   Anxiety depression: Controlled, she is fearful that an upcoming litigation with her ex-husband is going to cause worsening depression and would like to know if she can go to the maximum dose of rexulti which seems reasonable as long as she doesn't develop side effects Dizziness:  Orthostatic hypotension today, possibly due to lack of rest last night. Rule out electrolyte abnormality with metabolic panel Insomnia: Restart former regimen of Ambien, improved sleep does not help with lightheadedness return next week.  Return in about 3 months (around 06/11/2016) for mood.

## 2016-03-16 ENCOUNTER — Telehealth: Payer: Self-pay | Admitting: *Deleted

## 2016-03-16 NOTE — Telephone Encounter (Signed)
PA form faxed for ambien to [facility] [facility]

## 2016-03-31 ENCOUNTER — Other Ambulatory Visit: Payer: Self-pay | Admitting: Family Medicine

## 2016-04-27 ENCOUNTER — Encounter: Payer: Self-pay | Admitting: Family Medicine

## 2016-04-27 ENCOUNTER — Ambulatory Visit (INDEPENDENT_AMBULATORY_CARE_PROVIDER_SITE_OTHER): Payer: Medicaid Other | Admitting: Family Medicine

## 2016-04-27 VITALS — BP 108/65 | HR 63 | Wt 133.0 lb

## 2016-04-27 DIAGNOSIS — F418 Other specified anxiety disorders: Secondary | ICD-10-CM

## 2016-04-27 DIAGNOSIS — Z954 Presence of other heart-valve replacement: Secondary | ICD-10-CM | POA: Diagnosis not present

## 2016-04-27 DIAGNOSIS — F32A Depression, unspecified: Secondary | ICD-10-CM

## 2016-04-27 DIAGNOSIS — F329 Major depressive disorder, single episode, unspecified: Secondary | ICD-10-CM

## 2016-04-27 DIAGNOSIS — F419 Anxiety disorder, unspecified: Secondary | ICD-10-CM

## 2016-04-27 DIAGNOSIS — Z952 Presence of prosthetic heart valve: Secondary | ICD-10-CM

## 2016-04-27 DIAGNOSIS — E039 Hypothyroidism, unspecified: Secondary | ICD-10-CM | POA: Diagnosis not present

## 2016-04-27 MED ORDER — ZOLPIDEM TARTRATE 10 MG PO TABS: 10.0000 mg | ORAL_TABLET | Freq: Every evening | ORAL | 1 refills | Status: DC | PRN

## 2016-04-27 MED ORDER — BREXPIPRAZOLE 3 MG PO TABS: 3.0000 mg | ORAL_TABLET | Freq: Every day | ORAL | 2 refills | Status: DC

## 2016-04-27 MED ORDER — ALPRAZOLAM 1 MG PO TABS: ORAL_TABLET | ORAL | 1 refills | Status: DC

## 2016-04-27 MED ORDER — LEVOTHYROXINE SODIUM 75 MCG PO TABS: 75.0000 ug | ORAL_TABLET | Freq: Every day | ORAL | 3 refills | Status: DC

## 2016-04-27 NOTE — Progress Notes (Signed)
CC: Rachael Douglas is a 60 y.o. female is here for No chief complaint on file.   Subjective: HPI:   Follow-up anxiety and depression: She believes that the increase in rexalti has made a significant improvement with her are ready improved depression. She still gets anxious thinking about litigation with her ex-husband. Anxiety seems to be keeping her up at night as well. She's been taking Ambien to try to get the bed but she still wakes up sometime around 2 AM and can't go back to bed until 5 AM. She's no longer having any lightheadedness episodes that she had at the last visit. No thoughts of wanting to harm self or others.  Follow-up hyperthyroidism: She is requesting a refill on her levothyroxine with my upcoming departure.  She wants to establish care with [facility] cardiology she doesn't have a particular provider that she wants to see but it's been about a year since she saw cardiology through [facility] for follow-up of her aortic valve replacement. She denies any new shortness of breath edema or chest discomfort.     Review Of Systems Outlined In HPI  Past Medical History:  Diagnosis Date  . Arthritis   . Heart disease   . Thyroid disease     Past Surgical History:  Procedure Laterality Date  . AORTIC VALVE REPLACEMENT    . TOTAL ABDOMINAL HYSTERECTOMY     Family History  Problem Relation Age of Onset  . Cancer    . Depression    . Diabetes    . Hypertension      Social History   Social History  . Marital status: Married    Spouse name: N/A  . Number of children: N/A  . Years of education: N/A   Occupational History  . Not on file.   Social History Main Topics  . Smoking status: Former Smoker    Types: Cigarettes    Quit date: 05/30/2013  . Smokeless tobacco: Never Used  . Alcohol use No  . Drug use: No  . Sexual activity: Not Currently   Other Topics Concern  . Not on file   Social History Narrative  . No narrative on file     Objective: BP 108/65    Pulse 63   Wt 133 lb (60.3 kg)   BMI 22.83 kg/m   General: Alert and Oriented, No Acute Distress HEENT: Pupils equal, round, reactive to light. Conjunctivae clear.  Moist mucous membranes Lungs: Clear to auscultation bilaterally, no wheezing/ronchi/rales.  Comfortable work of breathing. Good air movement. Cardiac: Regular rate and rhythm. Normal S1/S2.  Holosystolic murmur grade 2/6 left second intercostal  region  Extremities: No peripheral edema.  Strong peripheral pulses.  Mental Status: No depression, anxiety, nor agitation. Skin: Warm and dry.  Assessment & Plan: Diagnoses and all orders for this visit:  Aortic valve replaced -     Ambulatory referral to Cardiology  Anxiety and depression -     Brexpiprazole (REXULTI) 3 MG TABS; Take 3 mg by mouth daily.  Acquired hypothyroidism -     levothyroxine (SYNTHROID, LEVOTHROID) 75 MCG tablet; Take 1 tablet (75 mcg total) by mouth daily before breakfast.  Other orders -     zolpidem (AMBIEN) 10 MG tablet; Take 1 tablet (10 mg total) by mouth at bedtime as needed for sleep. -     ALPRAZolam (XANAX) 1 MG tablet; take 1 tablet by mouth EVERY NIGHT AND NO MORE THAN ONE DURING THE DAY ONLY AS NEEDED FOR ANXIETY.  Referral For cardiology has been placed per her request Anxiety and depression: Depression is currently controlled continue rexalti. Anxiety seems to be uncontrolled however she's getting some benefit from Xanax but would like her to increase her Ambien in the evening to 10 mg in hopes of helping her get better sleep which I hope will help her anxiety and the long run. Hypothyroidism: Refills of levothyroxine anticipate TSH at next follow-up visit  25 minutes spent face-to-face during visit today of which at least 50% was counseling or coordinating care regarding: 1. Aortic valve replaced   2. Anxiety and depression   3. Acquired hypothyroidism    Discussed with this patient that I will be resigning from my position here  with [facility] in September in order to stay with my [place]. I let him know about the providers that are still accepting patients and I feel that this individual will be under great care if he/she stays here with [facility].   Return in about 3 months (around 07/28/2016).

## 2016-05-02 ENCOUNTER — Telehealth: Payer: Self-pay | Admitting: Family Medicine

## 2016-05-02 NOTE — Telephone Encounter (Signed)
Pt states she has been "very bloated." Pt spoke with her pharmacist about it and was advised it may be a side effect of her Rexulti. Pt reports she weighed 133lbs last Wednesday (which is her normal weight) and yesterday am she weighed 141lbs. Pt reports the weight gain came on "overnight." Pt states she took 40mg  of lasix and went down 5lbs. Will route to Dr. Ivan AnchorsHommel to see if any changes need to be made.

## 2016-05-02 NOTE — Telephone Encounter (Signed)
Pt advised of recommendation, verbalized understanding.  

## 2016-05-02 NOTE — Telephone Encounter (Signed)
It's ok to take 40mg  of lasix twice a day at least six hours apart until the bloating improves.

## 2016-05-05 ENCOUNTER — Ambulatory Visit: Payer: Self-pay | Admitting: Osteopathic Medicine

## 2016-05-05 DIAGNOSIS — Z5329 Procedure and treatment not carried out because of patient's decision for other reasons: Secondary | ICD-10-CM | POA: Insufficient documentation

## 2016-05-05 DIAGNOSIS — Z91199 Patient's noncompliance with other medical treatment and regimen due to unspecified reason: Secondary | ICD-10-CM | POA: Insufficient documentation

## 2016-05-06 NOTE — Telephone Encounter (Signed)
Closing encounter. Patient has picked up med with no problems

## 2016-05-10 ENCOUNTER — Ambulatory Visit (INDEPENDENT_AMBULATORY_CARE_PROVIDER_SITE_OTHER): Payer: Medicaid Other | Admitting: Family Medicine

## 2016-05-10 ENCOUNTER — Encounter: Payer: Self-pay | Admitting: Family Medicine

## 2016-05-10 VITALS — Wt 133.0 lb

## 2016-05-10 DIAGNOSIS — R05 Cough: Secondary | ICD-10-CM | POA: Diagnosis not present

## 2016-05-10 DIAGNOSIS — R059 Cough, unspecified: Secondary | ICD-10-CM

## 2016-05-10 MED ORDER — METHYLPREDNISOLONE ACETATE 80 MG/ML IJ SUSP
80.0000 mg | Freq: Once | INTRAMUSCULAR | Status: AC
  Administered 2016-05-10: 80 mg via INTRAMUSCULAR

## 2016-05-10 NOTE — Progress Notes (Signed)
While she was accompanying her husband she complained about sinus drainage that hasn't gone away in 1 week. She denies any fevers chills or difficulty breathing. She wants know if she can get a "shot of prednisone"

## 2016-05-26 NOTE — Progress Notes (Signed)
HPI: 60 year old female for evaluation of aortic valve replacement. Previously followed at [facility]. Had bioprosthetic aortic valve replacement in 2014. Apparently had no obstructive coronary disease preoperatively. She has had a previous monitor for palpitations that was normal by report. [place] in [place] showed normal LV systolic function, grade 1 diastolic dysfunction, prior aortic valve replacement with no aortic insufficiency and mean gradient 26 mmHg. Patient has mild dyspnea on exertion but no orthopnea, PND, palpitations or syncope. Occasional mild pedal edema. She occasionally has chest heaviness when she feels stressed. She does not have exertional chest pain. No associated symptoms. This has been a chronic issue by report.  Current Outpatient Prescriptions  Medication Sig Dispense Refill  . albuterol (PROAIR HFA) 108 (90 BASE) MCG/ACT inhaler INHALE 2 PUFFS EVERY 6 HOURS AS NEEDED    . ALPRAZolam (XANAX) 1 MG tablet take 1 tablet by mouth EVERY NIGHT AND NO MORE THAN ONE DURING THE DAY ONLY AS NEEDED FOR ANXIETY. 60 tablet 1  . aspirin EC 325 MG tablet Take 1 tablet (325 mg total) by mouth daily. TAKE 1 TABLET DAILY 90 tablet 2  . Brexpiprazole (REXULTI) 3 MG TABS Take 3 mg by mouth daily. 30 tablet 2  . DULoxetine (CYMBALTA) 60 MG capsule take 1 capsule by mouth once daily 90 capsule 1  . furosemide (LASIX) 40 MG tablet Take 1 tablet (40 mg total) by mouth daily as needed for edema. 90 tablet 1  . gabapentin (NEURONTIN) 600 MG tablet Take 2 tablets (1,200 mg total) by mouth 3 (three) times daily. 540 tablet 1  . HYDROcodone-homatropine (HYCODAN) 5-1.5 MG/5ML syrup Take 5 mLs by mouth every 6 (six) hours as needed for cough.    . levothyroxine (SYNTHROID, LEVOTHROID) 75 MCG tablet Take 1 tablet (75 mcg total) by mouth daily before breakfast. 90 tablet 3  . linaclotide (LINZESS) 290 MCG CAPS capsule Take 1 capsule (290 mcg total) by mouth daily. 90 capsule 1  .  meloxicam (MOBIC) 7.5 MG tablet Take 1-2 tablets (7.5-15 mg total) by mouth daily as needed for pain. 180 tablet 3  . mometasone-formoterol (DULERA) 200-5 MCG/ACT AERO Inhale 1 puff into the lungs daily. 3 Inhaler 3  . Omega 3 1200 MG CAPS Take by mouth.    . Potassium 75 MG TABS Take by mouth as directed.    . pravastatin (PRAVACHOL) 80 MG tablet take 1 tablet by mouth once daily 90 tablet 3  . sertraline (ZOLOFT) 100 MG tablet Take 100 mg by mouth daily.     No current facility-administered medications for this visit.     Allergies  Allergen Reactions  . Penicillins Swelling  . Alendronate Other (See Comments)    unknown  . Ranitidine Other (See Comments)    Facial swelling     Past Medical History:  Diagnosis Date  . Arthritis   . Heart disease   . Hyperlipidemia   . Hypertension   . PUD (peptic ulcer disease)   . Thyroid disease     Past Surgical History:  Procedure Laterality Date  . AORTIC VALVE REPLACEMENT    . TOTAL ABDOMINAL HYSTERECTOMY      Social History   Social History  . Marital status: Married    Spouse name: N/A  . Number of children: 2  . Years of education: N/A   Occupational History  . Not on file.   Social History Main Topics  . Smoking status: Former Smoker    Types: Cigarettes  Quit date: 05/30/2013  . Smokeless tobacco: Never Used  . Alcohol use No  . Drug use: No  . Sexual activity: Not Currently   Other Topics Concern  . Not on file   Social History Narrative  . No narrative on file    Family History  Problem Relation Age of Onset  . Cancer    . Depression    . Diabetes    . Hypertension      ROS: no fevers or chills, productive cough, hemoptysis, dysphasia, odynophagia, melena, hematochezia, dysuria, hematuria, rash, seizure activity, orthopnea, PND, claudication. Remaining systems are negative.  Physical Exam:   Blood pressure 106/71, pulse 68, height 5\' 4"  (1.626 m), weight 140 lb 6.4 oz (63.7 kg).  General:   Well developed/well nourished in NAD Skin warm/dry Patient not depressed No peripheral clubbing Back-normal HEENT-normal/normal eyelids Neck supple/normal carotid upstroke bilaterally; no JVD; no thyromegaly chest - CTA/ normal expansion CV - RRR/normal S1 and S2; no rubs or gallops;  PMI nondisplaced; 3/6 systolic murmur left sternal border radiating to the carotids. No diastolic murmur. Abdomen -NT/ND, no HSM, no mass, + bowel sounds, no bruit 2+ femoral pulses, no bruits Ext-no edema, chords, 2+ DP Neuro-grossly nonfocal  ECG - Sinus rhythm at a rate of 68. No ST changes.  1 Aortic valve replacement-continue SBE prophylaxis. Schedule echocardiogram to assess aortic valve and gradient (previous mean gradient mildly elevated). I will obtain records from [facility] concerning previous operation. She is unclear as to the cause of her previous aortic valve disease. Likely bicuspid valve.  2 hypertension-blood pressure controlled. Continue present medications.  3 hyperlipidemia-continue statin. Lipids and liver monitored by primary care.  [name], MD

## 2016-05-30 ENCOUNTER — Other Ambulatory Visit: Payer: Self-pay | Admitting: Family Medicine

## 2016-06-01 ENCOUNTER — Encounter: Payer: Self-pay | Admitting: Cardiology

## 2016-06-01 ENCOUNTER — Ambulatory Visit (INDEPENDENT_AMBULATORY_CARE_PROVIDER_SITE_OTHER): Payer: Medicaid Other | Admitting: Cardiology

## 2016-06-01 VITALS — BP 106/71 | HR 68 | Ht 64.0 in | Wt 140.4 lb

## 2016-06-01 DIAGNOSIS — Z954 Presence of other heart-valve replacement: Secondary | ICD-10-CM

## 2016-06-01 DIAGNOSIS — I10 Essential (primary) hypertension: Secondary | ICD-10-CM | POA: Diagnosis not present

## 2016-06-01 DIAGNOSIS — E785 Hyperlipidemia, unspecified: Secondary | ICD-10-CM | POA: Diagnosis not present

## 2016-06-01 DIAGNOSIS — Z952 Presence of prosthetic heart valve: Secondary | ICD-10-CM

## 2016-06-01 NOTE — Patient Instructions (Signed)

## 2016-06-06 ENCOUNTER — Telehealth: Payer: Self-pay | Admitting: Cardiology

## 2016-06-06 NOTE — Telephone Encounter (Signed)
Faxed Release signed by patient to [facility] to obtain records per Dr Jens Somrenshaw. lp

## 2016-06-07 ENCOUNTER — Telehealth: Payer: Self-pay | Admitting: Cardiology

## 2016-06-07 NOTE — Telephone Encounter (Signed)
Received records from [facility] as requested by Dr Jens Somrenshaw.  Records given to Dr Jens Somrenshaw to review. lp

## 2016-06-14 ENCOUNTER — Other Ambulatory Visit: Payer: Self-pay | Admitting: Family Medicine

## 2016-06-15 ENCOUNTER — Other Ambulatory Visit: Payer: Self-pay | Admitting: Family Medicine

## 2016-06-15 ENCOUNTER — Ambulatory Visit (HOSPITAL_BASED_OUTPATIENT_CLINIC_OR_DEPARTMENT_OTHER)
Admission: RE | Admit: 2016-06-15 | Discharge: 2016-06-15 | Disposition: A | Discharge status: Home / Self Care | Payer: Medicaid Other | Source: Ambulatory Visit | Attending: Cardiology | Admitting: Cardiology

## 2016-06-15 DIAGNOSIS — Z954 Presence of other heart-valve replacement: Secondary | ICD-10-CM | POA: Diagnosis not present

## 2016-06-15 DIAGNOSIS — Z952 Presence of prosthetic heart valve: Secondary | ICD-10-CM

## 2016-06-15 NOTE — Progress Notes (Signed)
  Echocardiogram 2D Echocardiogram has been performed.  Arvil ChacoFoster, Porfirio Bollier 06/15/2016, 3:12 PM

## 2016-07-13 ENCOUNTER — Telehealth: Payer: Self-pay | Admitting: Cardiology

## 2016-07-13 MED ORDER — METOPROLOL TARTRATE 25 MG PO TABS: 25.0000 mg | ORAL_TABLET | Freq: Two times a day (BID) | ORAL | 3 refills | Status: DC

## 2016-07-13 NOTE — Telephone Encounter (Signed)
New meessage        *STAT* If patient is at the pharmacy, call can be transferred to refill team.   1. Which medications need to be refilled? (please list name of each medication and dose if known) metoprolol  2. Which pharmacy/location (including street and city if local pharmacy) is medication to be sent to? [facility] [address] in [place] 3. Do they need a 30 day or 90 day supply? 90 day Pt did not know the milligram. She states she takes 1 pill in am and 1 pill in pm

## 2016-07-13 NOTE — Telephone Encounter (Signed)
Pt is returning your call

## 2016-07-13 NOTE — Telephone Encounter (Signed)
Left message for pt to call.

## 2016-07-13 NOTE — Telephone Encounter (Signed)
Refill sent to the pharmacy electronically.  

## 2016-08-01 ENCOUNTER — Other Ambulatory Visit: Payer: Self-pay | Admitting: Physician Assistant

## 2016-08-01 ENCOUNTER — Ambulatory Visit (INDEPENDENT_AMBULATORY_CARE_PROVIDER_SITE_OTHER): Payer: Medicaid Other | Admitting: Family Medicine

## 2016-08-01 DIAGNOSIS — R197 Diarrhea, unspecified: Secondary | ICD-10-CM | POA: Insufficient documentation

## 2016-08-01 MED ORDER — ALPRAZOLAM 1 MG PO TABS: ORAL_TABLET | ORAL | 1 refills | Status: DC

## 2016-08-01 NOTE — Patient Instructions (Signed)
Thank you for coming in today. Take over the counter imodium.  Return in 2 weeks with Jade.  Continue holding off Linzess.   Diarrhea Diarrhea is frequent loose and watery bowel movements. It can cause you to feel weak and dehydrated. Dehydration can cause you to become tired and thirsty, have a dry mouth, and have decreased urination that often is dark yellow. Diarrhea is a sign of another problem, most often an infection that will not last long. In most cases, diarrhea typically lasts 2-3 days. However, it can last longer if it is a sign of something more serious. It is important to treat your diarrhea as directed by your caregiver to lessen or prevent future episodes of diarrhea. CAUSES  Some common causes include:  Gastrointestinal infections caused by viruses, bacteria, or parasites.  Food poisoning or food allergies.  Certain medicines, such as antibiotics, chemotherapy, and laxatives.  Artificial sweeteners and fructose.  Digestive disorders. HOME CARE INSTRUCTIONS  Ensure adequate fluid intake (hydration): Have 1 cup (8 oz) of fluid for each diarrhea episode. Avoid fluids that contain simple sugars or sports drinks, fruit juices, whole milk products, and sodas. Your urine should be clear or pale yellow if you are drinking enough fluids. Hydrate with an oral rehydration solution that you can purchase at pharmacies, retail stores, and online. You can prepare an oral rehydration solution at home by mixing the following ingredients together:   - tsp table salt.   tsp baking soda.   tsp salt substitute containing potassium chloride.  1  tablespoons sugar.  1 L (34 oz) of water.  Certain foods and beverages may increase the speed at which food moves through the gastrointestinal (GI) tract. These foods and beverages should be avoided and include:  Caffeinated and alcoholic beverages.  High-fiber foods, such as raw fruits and vegetables, nuts, seeds, and whole grain breads and  cereals.  Foods and beverages sweetened with sugar alcohols, such as xylitol, sorbitol, and mannitol.  Some foods may be well tolerated and may help thicken stool including:  Starchy foods, such as rice, toast, pasta, low-sugar cereal, oatmeal, grits, baked potatoes, crackers, and bagels.  Bananas.  Applesauce.  Add probiotic-rich foods to help increase healthy bacteria in the GI tract, such as yogurt and fermented milk products.  Wash your hands well after each diarrhea episode.  Only take over-the-counter or prescription medicines as directed by your caregiver.  Take a warm bath to relieve any burning or pain from frequent diarrhea episodes. SEEK IMMEDIATE MEDICAL CARE IF:   You are unable to keep fluids down.  You have persistent vomiting.  You have blood in your stool, or your stools are black and tarry.  You do not urinate in 6-8 hours, or there is only a small amount of very dark urine.  You have abdominal pain that increases or localizes.  You have weakness, dizziness, confusion, or light-headedness.  You have a severe headache.  Your diarrhea gets worse or does not get better.  You have a fever or persistent symptoms for more than 2-3 days.  You have a fever and your symptoms suddenly get worse. MAKE SURE YOU:   Understand these instructions.  Will watch your condition.  Will get help right away if you are not doing well or get worse.   This information is not intended to replace advice given to you by your health care provider. Make sure you discuss any questions you have with your health care provider.   Document Released: 08/26/2002  Document Revised: 09/26/2014 Document Reviewed: 05/13/2012 Elsevier Interactive Patient Education [employer]2016 Elsevier Inc.

## 2016-08-01 NOTE — Progress Notes (Signed)
Rachael Douglas is a 60 y.o. female who [facility] today for diarrhea for about 2 weeks. Patient notes no bloody not particularly painful intermittent diarrhea. She struggled with constipation most of her adult life. Previously she was taken Linzess but stopped taking this medication when she developed diarrhea. She notes some gas but denies pain fevers chills vomiting or  constipation. She has not tried any medications yet.  Past Medical History:  Diagnosis Date  . Arthritis   . Heart disease   . Hyperlipidemia   . Hypertension   . PUD (peptic ulcer disease)   . Thyroid disease    Past Surgical History:  Procedure Laterality Date  . AORTIC VALVE REPLACEMENT    . TOTAL ABDOMINAL HYSTERECTOMY     Social History  Substance Use Topics  . Smoking status: Former Smoker    Types: Cigarettes    Quit date: 05/30/2013  . Smokeless tobacco: Never Used  . Alcohol use No   family history is not on file.  ROS as above:  Medications: Current Outpatient Prescriptions  Medication Sig Dispense Refill  . albuterol (PROAIR HFA) 108 (90 BASE) MCG/ACT inhaler INHALE 2 PUFFS EVERY 6 HOURS AS NEEDED    . ALPRAZolam (XANAX) 1 MG tablet take 1 tablet by mouth EVERY NIGHT AND NO MORE THAN ONE DURING THE DAY ONLY AS NEEDED FOR ANXIETY. 60 tablet 1  . aspirin EC 325 MG tablet Take 1 tablet (325 mg total) by mouth daily. TAKE 1 TABLET DAILY 90 tablet 2  . Brexpiprazole (REXULTI) 3 MG TABS Take 3 mg by mouth daily. 30 tablet 2  . DULoxetine (CYMBALTA) 60 MG capsule take 1 capsule by mouth once daily 90 capsule 1  . furosemide (LASIX) 40 MG tablet Take 1 tablet (40 mg total) by mouth daily as needed for edema. 90 tablet 1  . gabapentin (NEURONTIN) 600 MG tablet Take 2 tablets (1,200 mg total) by mouth 3 (three) times daily. 540 tablet 1  . HYDROcodone-homatropine (HYCODAN)  5-1.5 MG/5ML syrup Take 5 mLs by mouth every 6 (six) hours as needed for cough.    . levothyroxine (SYNTHROID, LEVOTHROID) 75 MCG tablet Take 1 tablet (75 mcg total) by mouth daily before breakfast. 90 tablet 3  . linaclotide (LINZESS) 290 MCG CAPS capsule Take 1 capsule (290 mcg total) by mouth daily. 90 capsule 1  . meloxicam (MOBIC) 7.5 MG tablet Take 1-2 tablets (7.5-15 mg total) by mouth daily as needed for pain. 180 tablet 3  . metoprolol tartrate (LOPRESSOR) 25 MG tablet Take 1 tablet (25 mg total) by mouth 2 (two) times daily. 180 tablet 3  . mometasone-formoterol (DULERA) 200-5 MCG/ACT AERO Inhale 1 puff into the lungs daily. 3 Inhaler 3  . Omega 3 1200 MG CAPS Take by mouth.    . Potassium 75 MG TABS Take by mouth as directed.    . pravastatin (PRAVACHOL) 80 MG tablet take 1 tablet by mouth once daily 90 tablet 3  . sertraline (ZOLOFT) 100 MG tablet Take 100 mg by mouth daily.    [name]. triamcinolone cream (KENALOG) 0.1 % Apply topically 2 (two) times daily. AVOID FACE.  NEED FOLLOW UP VISIT FOR MORE REFILLS 80 g 0   No current facility-administered medications for this visit.    Allergies  Allergen Reactions  . Penicillins Swelling  . Alendronate Other (See Comments)    unknown  . Ranitidine Other (See Comments)  Facial swelling    Health Maintenance Health Maintenance  Topic Date Due  . Hepatitis C Screening  01-10-1956  . HIV Screening  12/06/1969  . TETANUS/TDAP  12/06/1973  . PAP SMEAR  12/07/1975  . COLONOSCOPY  12/06/2004  . ZOSTAVAX  12/07/2014  . INFLUENZA VACCINE  04/19/2016  . MAMMOGRAM  10/01/2016     Exam:  BP (!) 115/54   Pulse (!) 58   Temp 98.2 F (36.8 C) (Oral)  Gen: Well NAD NONTOXIC APPEARING  HEENT: EOMI,  MMM Lungs: Normal work of breathing. CTABL Heart: RRR no MRG Abd: NABS, Soft. Nondistended, Nontender Exts: Brisk capillary refill, warm and well perfused.    No results found for this or any previous visit (from the past 72  hour(s)). No results found.    Assessment and Plan: 60 y.o. female with  diarrhea. Unclear etiology. Plan for laboratory workup listed below. Recommend symptomatic management with Imodium. Follow-up with PCP in the near future.   Orders Placed This Encounter  Procedures  . Stool culture  . CBC  . COMPLETE METABOLIC PANEL WITH GFR    Discussed warning signs or symptoms. Please see discharge instructions. Patient expresses understanding.

## 2016-08-02 ENCOUNTER — Ambulatory Visit: Payer: Self-pay | Admitting: Family Medicine

## 2016-08-02 LAB — COMPLETE METABOLIC PANEL WITH GFR
ALT: 7 U/L (ref 6–29)
AST: 14 U/L (ref 10–35)
Albumin: 3.9 g/dL (ref 3.6–5.1)
Alkaline Phosphatase: 58 U/L (ref 33–130)
BUN: 13 mg/dL (ref 7–25)
CALCIUM: 9 mg/dL (ref 8.6–10.4)
CHLORIDE: 101 mmol/L (ref 98–110)
CO2: 29 mmol/L (ref 20–31)
Creat: 1.11 mg/dL — ABNORMAL HIGH (ref 0.50–0.99)
GFR, EST NON AFRICAN AMERICAN: 54 mL/min — AB (ref >=60)
GFR, Est African American: 62 mL/min (ref >=60)
GLUCOSE: 81 mg/dL (ref 65–99)
POTASSIUM: 4.6 mmol/L (ref 3.5–5.3)
SODIUM: 140 mmol/L (ref 135–146)
Total Bilirubin: 0.4 mg/dL (ref 0.2–1.2)
Total Protein: 6.4 g/dL (ref 6.1–8.1)

## 2016-08-02 LAB — CBC
HCT: 39.7 % (ref 35.0–45.0)
Hemoglobin: 12.9 g/dL (ref 11.7–15.5)
MCH: 29.3 pg (ref 27.0–33.0)
MCHC: 32.5 g/dL (ref 32.0–36.0)
MCV: 90.2 fL (ref 80.0–100.0)
MPV: 9.8 fL (ref 7.5–12.5)
Platelets: 265 10*3/uL (ref 140–400)
RBC: 4.4 MIL/uL (ref 3.80–5.10)
RDW: 14.1 % (ref 11.0–15.0)
WBC: 6.1 10*3/uL (ref 3.8–10.8)

## 2016-08-06 LAB — STOOL CULTURE

## 2016-08-15 ENCOUNTER — Telehealth: Payer: Self-pay

## 2016-08-22 ENCOUNTER — Encounter: Payer: Self-pay | Admitting: Physician Assistant

## 2016-08-22 ENCOUNTER — Ambulatory Visit (INDEPENDENT_AMBULATORY_CARE_PROVIDER_SITE_OTHER): Payer: Medicaid Other | Admitting: Physician Assistant

## 2016-08-22 VITALS — BP 123/62 | HR 69 | Temp 98.8°F | Wt 130.0 lb

## 2016-08-22 DIAGNOSIS — R630 Anorexia: Secondary | ICD-10-CM | POA: Diagnosis not present

## 2016-08-22 DIAGNOSIS — R634 Abnormal weight loss: Secondary | ICD-10-CM

## 2016-08-22 DIAGNOSIS — R197 Diarrhea, unspecified: Secondary | ICD-10-CM | POA: Diagnosis not present

## 2016-08-22 DIAGNOSIS — R5383 Other fatigue: Secondary | ICD-10-CM | POA: Diagnosis not present

## 2016-08-22 MED ORDER — DICYCLOMINE HCL 10 MG PO CAPS: 10.0000 mg | ORAL_CAPSULE | Freq: Three times a day (TID) | ORAL | 0 refills | Status: DC

## 2016-08-22 NOTE — Addendum Note (Signed)
Addended by: Jomarie LongsBREEBACK, JADE L on: 08/22/2016 05:27 PM   Modules accepted: Orders

## 2016-08-22 NOTE — Patient Instructions (Signed)

## 2016-08-22 NOTE — Progress Notes (Addendum)
   Subjective:    Patient ID: Rachael Douglas, female    DOB: 1955/10/16, 60 y.o.   MRN: [id number]  HPI Patient is a 60 yo female complaining of diarrhea for a month. Patient is following up for this problem. She saw Dr. Denyse Amassorey two weeks ago. He ordered a stool culture which was negative for any bacteria or parasitic causes along with a CBC which was non-contributory. She reports that the diarrhea occurs after each meal and that it has not gotten any better. She has about 4-5 episodes of diarrhea daily. Patient denies hematochezia. She also reports "gas pains" after she eats and rates the pain a 5/10. Patient also reports feeling fatigued and stressed. Patient is also concerned about losing ten pounds over the past year. Patient also reports a loss of appetite. She reports only eating dinner due to her lack of appetite. Patient eats a lot of vegetables along with chicken and beef. Patient denies reflux symptoms. Patient has experienced constipation most of her life and was on Linzess; however, she has stopped taking this medication after the diarrhea started. Patient's sister has a history of colon caner. Patient's last colonoscopy was 2009. Patient denies nausea or vomiting.    Review of Systems See HPI     Objective:   Physical Exam  Constitutional: She is oriented to person, place, and time. She appears well-developed and well-nourished.  Thin and in no acute distress.   HENT:  Head: Normocephalic and atraumatic.  Cardiovascular: Normal rate and regular rhythm.   Holosystolic murmur 2/6  Pulmonary/Chest: Effort normal and breath sounds normal. No respiratory distress. She has no wheezes. She has no rales.  Abdominal: She exhibits no distension and no mass. There is tenderness (Tenderness to palpation of right lower quadrant. ). There is no rebound and no guarding.  Hyperactive bowel sounds.   Neurological: She is alert and oriented to person, place, and time.  Psychiatric: She has a normal  mood and affect. Her behavior is normal.       Assessment & Plan:  Greidy was seen today for diarreha.  Diagnoses and all orders for this visit:  1. Diarrhea, unspecified type -  Dicyclomine (BENTYL) 10 MG capsule; Take 1 capsule (10 mg total) by mouth 3 (three) times daily before meals to help with diarrhea. - Clostridium difficile culture-fecal due to diarrhea for one month.  -CT abdomen/pelvis due to one month of weight loss, along with loss of appetite, unexplained weight loss and fatigue.  -Refer to gastroenterology for colonoscopy due to one month of diarrhea, loss of appetite and fatigue.   2. Loss of appetite  -CT abdomen/pelvis due to one month of weight loss, along with  loss of appetite, unexplained weight loss and fatigue.   -Refer to gastroenterology for colonoscopy due to one month of  diarrhea, loss of appetite and fatigue.   3. Unexplained weight loss  -CT abdomen/pelvis due to one month of weight loss, along with  loss of appetite, unexplained weight loss and fatigue.   -Refer to gastroenterology for colonoscopy due to one month of  diarrhea, loss of appetite and fatigue.   4. Fatigue, unspecified   -CT abdomen/pelvis due to one month of weight loss, along with  loss of appetite, unexplained weight loss and fatigue.   -Refer to gastroenterology for colonoscopy due to one month of  diarrhea, loss of appetite and fatigue.

## 2016-08-25 ENCOUNTER — Ambulatory Visit (HOSPITAL_BASED_OUTPATIENT_CLINIC_OR_DEPARTMENT_OTHER)
Admission: RE | Admit: 2016-08-25 | Discharge: 2016-08-25 | Disposition: A | Discharge status: Home / Self Care | Payer: Medicaid Other | Source: Ambulatory Visit | Attending: Physician Assistant | Admitting: Physician Assistant

## 2016-08-25 DIAGNOSIS — R630 Anorexia: Secondary | ICD-10-CM | POA: Insufficient documentation

## 2016-08-25 DIAGNOSIS — Z90710 Acquired absence of both cervix and uterus: Secondary | ICD-10-CM | POA: Insufficient documentation

## 2016-08-25 DIAGNOSIS — I700 Atherosclerosis of aorta: Secondary | ICD-10-CM | POA: Diagnosis not present

## 2016-08-25 DIAGNOSIS — R5383 Other fatigue: Secondary | ICD-10-CM

## 2016-08-25 DIAGNOSIS — R634 Abnormal weight loss: Secondary | ICD-10-CM | POA: Diagnosis present

## 2016-08-25 DIAGNOSIS — R197 Diarrhea, unspecified: Secondary | ICD-10-CM | POA: Insufficient documentation

## 2016-08-25 MED ORDER — IOPAMIDOL (ISOVUE-300) INJECTION 61%
100.0000 mL | Freq: Once | INTRAVENOUS | Status: AC | PRN
  Administered 2016-08-25: 100 mL via INTRAVENOUS

## 2016-08-26 NOTE — Progress Notes (Signed)
Call pt: CT of abdomen is did not show any abnormalities. I do think you should get a colonoscopy. How has bentyl been doing?

## 2016-08-29 ENCOUNTER — Telehealth: Payer: Self-pay | Admitting: Physician Assistant

## 2016-08-29 ENCOUNTER — Other Ambulatory Visit: Payer: Self-pay | Admitting: *Deleted

## 2016-08-29 MED ORDER — DULOXETINE HCL 60 MG PO CPEP: 60.0000 mg | ORAL_CAPSULE | Freq: Every day | ORAL | 0 refills | Status: DC

## 2016-08-29 NOTE — Telephone Encounter (Signed)
I called pt and left message for patient to switch her care to another pcp in order to refill meds

## 2016-08-30 LAB — CLOSTRIDIUM DIFFICILE CULTURE-FECAL

## 2016-08-30 NOTE — Progress Notes (Signed)
How are symptoms?

## 2016-08-30 NOTE — Progress Notes (Signed)
Call pt: C.diff is negative.

## 2016-09-02 ENCOUNTER — Other Ambulatory Visit: Payer: Self-pay | Admitting: Physician Assistant

## 2016-09-02 ENCOUNTER — Other Ambulatory Visit: Payer: Self-pay

## 2016-09-02 MED ORDER — LINACLOTIDE 290 MCG PO CAPS: 290.0000 ug | ORAL_CAPSULE | Freq: Every day | ORAL | 1 refills | Status: DC

## 2016-09-05 ENCOUNTER — Other Ambulatory Visit: Payer: Self-pay | Admitting: Physician Assistant

## 2016-09-05 ENCOUNTER — Telehealth: Payer: Self-pay

## 2016-09-05 DIAGNOSIS — K259 Gastric ulcer, unspecified as acute or chronic, without hemorrhage or perforation: Secondary | ICD-10-CM

## 2016-09-05 LAB — HM COLONOSCOPY

## 2016-09-05 NOTE — Telephone Encounter (Signed)
Rachael Douglas had her Endo this morning and she has ulcers. She is complaining of a headache, runny nose, pressure in ears and a productive cough with yellow sputum for a couple of days. She would like an antibiotic sent in. Please advise.

## 2016-09-05 NOTE — Telephone Encounter (Signed)
Symptoms sound consisent with viral upper respiratory infection. 90 percent of time this is viral and not bacterial in nature. Try tylenol cold and sinus severe with delsym for cough suppressant. If not improving in next 2 days come in for office visit to evaluate.

## 2016-09-05 NOTE — Telephone Encounter (Signed)
Left message advising patient of recommendations.  °

## 2016-09-09 ENCOUNTER — Other Ambulatory Visit: Payer: Self-pay | Admitting: Physician Assistant

## 2016-09-09 DIAGNOSIS — K52831 Collagenous colitis: Secondary | ICD-10-CM | POA: Insufficient documentation

## 2016-09-13 ENCOUNTER — Encounter: Payer: Self-pay | Admitting: *Deleted

## 2016-09-13 ENCOUNTER — Emergency Department (INDEPENDENT_AMBULATORY_CARE_PROVIDER_SITE_OTHER)
Admission: EM | Admit: 2016-09-13 | Discharge: 2016-09-13 | Disposition: A | Discharge status: Home / Self Care | Payer: Medicaid Other | Source: Home / Self Care | Attending: Family Medicine | Admitting: Family Medicine

## 2016-09-13 DIAGNOSIS — J0100 Acute maxillary sinusitis, unspecified: Secondary | ICD-10-CM

## 2016-09-13 MED ORDER — PREDNISONE 20 MG PO TABS: ORAL_TABLET | ORAL | 0 refills | Status: DC

## 2016-09-13 MED ORDER — DOXYCYCLINE HYCLATE 100 MG PO CAPS: 100.0000 mg | ORAL_CAPSULE | Freq: Two times a day (BID) | ORAL | 0 refills | Status: DC

## 2016-09-13 NOTE — ED Provider Notes (Signed)
CSN: [id number]     Arrival date & time 09/13/16  1326 History   First MD Initiated Contact with Patient 09/13/16 1449     Chief Complaint  Patient presents with  . Sinus Problem  . Nasal Congestion   (Consider location/radiation/quality/duration/timing/severity/associated sxs/prior Treatment) HPI  Devonia Kennyth LoseR Vanwart is a 60 y.o. female presenting to UC with c/o 2 weeks of gradually worsening sinus congestion, pressure, and facial pain.  She is also c/o bilateral ear fullness and frontal headache.  She has tried Aleve and Benadryl with mild relief.  Hx of sinus infections in the past. Denies fever, chills, n/v/d.    Past Medical History:  Diagnosis Date  . Arthritis   . Heart disease   . Hyperlipidemia   . Hypertension   . PUD (peptic ulcer disease)   . Thyroid disease    Past Surgical History:  Procedure Laterality Date  . AORTIC VALVE REPLACEMENT    . TOTAL ABDOMINAL HYSTERECTOMY     Family History  Problem Relation Age of Onset  . Cancer    . Depression    . Diabetes    . Hypertension     Social History  Substance Use Topics  . Smoking status: Former Smoker    Types: Cigarettes    Quit date: 05/30/2013  . Smokeless tobacco: Never Used  . Alcohol use No   OB History    No data available     Review of Systems  Constitutional: Negative for chills and fever.  HENT: Positive for congestion, ear pain, postnasal drip, rhinorrhea, sinus pain, sinus pressure and sore throat. Negative for trouble swallowing and voice change.   Respiratory: Positive for cough ( minimal). Negative for shortness of breath.   Cardiovascular: Negative for chest pain and palpitations.  Gastrointestinal: Negative for abdominal pain, diarrhea, nausea and vomiting.  Musculoskeletal: Negative for arthralgias, back pain and myalgias.  Skin: Negative for rash.  Neurological: Positive for headaches. Negative for dizziness and light-headedness.    Allergies  Penicillins; Alendronate; and  Ranitidine  Home Medications   Prior to Admission medications   Medication Sig Start Date End Date Taking? Authorizing Provider  albuterol (PROAIR HFA) 108 (90 BASE) MCG/ACT inhaler INHALE 2 PUFFS EVERY 6 HOURS AS NEEDED 04/23/14   Historical Provider, MD  ALPRAZolam (XANAX) 1 MG tablet take 1 tablet by mouth EVERY NIGHT AND NO MORE THAN ONE DURING THE DAY ONLY AS NEEDED FOR ANXIETY. 08/01/16   Jomarie LongsJade L Breeback, PA-C  aspirin EC 325 MG tablet Take 1 tablet (325 mg total) by mouth daily. TAKE 1 TABLET DAILY 02/05/16   Rodolph BongEvan S Corey, MD  Brexpiprazole (REXULTI) 3 MG TABS Take 3 mg by mouth daily. 04/27/16   Laren BoomSean Hommel, DO  dicyclomine (BENTYL) 10 MG capsule Take 1 capsule (10 mg total) by mouth 3 (three) times daily before meals. 08/22/16   Jade L Breeback, PA-C  doxycycline (VIBRAMYCIN) 100 MG capsule Take 1 capsule (100 mg total) by mouth 2 (two) times daily. One po bid x 7 days 09/13/16   Junius FinnerErin O'Malley, PA-C  DULoxetine (CYMBALTA) 60 MG capsule Take 1 capsule (60 mg total) by mouth daily. Needs f/u 08/29/16   Agapito Gamesatherine D Metheney, MD  furosemide (LASIX) 40 MG tablet Take 1 tablet (40 mg total) by mouth daily as needed for edema. 03/07/16 03/04/17  Sean Hommel, DO  gabapentin (NEURONTIN) 600 MG tablet Take 2 tablets (1,200 mg total) by mouth 3 (three) times daily. 03/11/16   Laren BoomSean Hommel, DO  HYDROcodone-homatropine (  HYCODAN) 5-1.5 MG/5ML syrup Take 5 mLs by mouth every 6 (six) hours as needed for cough.    Historical Provider, MD  levothyroxine (SYNTHROID, LEVOTHROID) 75 MCG tablet Take 1 tablet (75 mcg total) by mouth daily before breakfast. 04/27/16 04/27/17  Laren BoomSean Hommel, DO  linaclotide (LINZESS) 290 MCG CAPS capsule Take 1 capsule (290 mcg total) by mouth daily. 09/02/16   Jade L Breeback, PA-C  meloxicam (MOBIC) 7.5 MG tablet Take 1-2 tablets (7.5-15 mg total) by mouth daily as needed for pain. 02/05/16   Rodolph BongEvan S Corey, MD  metoprolol tartrate (LOPRESSOR) 25 MG tablet Take 1 tablet (25 mg total) by mouth 2  (two) times daily. 07/13/16 10/11/16  Lewayne BuntingBrian S Crenshaw, MD  mometasone-formoterol (DULERA) 200-5 MCG/ACT AERO Inhale 1 puff into the lungs daily. 03/10/15   Sean Hommel, DO  Omega 3 1200 MG CAPS Take by mouth.    Historical Provider, MD  Potassium 75 MG TABS Take by mouth as directed.    Historical Provider, MD  pravastatin (PRAVACHOL) 80 MG tablet take 1 tablet by mouth once daily 05/30/16   Jade L Breeback, PA-C  predniSONE (DELTASONE) 20 MG tablet 3 tabs po day one, then 2 po daily x 4 days 09/13/16   Junius FinnerErin O'Malley, PA-C  sertraline (ZOLOFT) 100 MG tablet Take 100 mg by mouth daily.    Historical Provider, MD  triamcinolone cream (KENALOG) 0.1 % Apply topically 2 (two) times daily. AVOID FACE. 09/05/16   Jomarie LongsJade L Breeback, PA-C   Meds Ordered and Administered this Visit  Medications - No data to display  BP 91/61 (BP Location: Left Arm)   Pulse 70   Temp 98 F (36.7 C) (Oral)   Wt 134 lb (60.8 kg)   SpO2 99%   BMI 23.00 kg/m  No data found.   Physical Exam  Constitutional: She appears well-developed and well-nourished. No distress.  HENT:  Head: Normocephalic and atraumatic.  Right Ear: Tympanic membrane normal.  Left Ear: Tympanic membrane normal.  Nose: Mucosal edema present. Right sinus exhibits maxillary sinus tenderness. Right sinus exhibits no frontal sinus tenderness. Left sinus exhibits maxillary sinus tenderness. Left sinus exhibits no frontal sinus tenderness.  Mouth/Throat: Uvula is midline, oropharynx is clear and moist and mucous membranes are normal.  Eyes: Conjunctivae are normal. No scleral icterus.  Neck: Normal range of motion. Neck supple.  Cardiovascular: Normal rate, regular rhythm and normal heart sounds.   Pulmonary/Chest: Effort normal and breath sounds normal. No stridor. No respiratory distress. She has no wheezes. She has no rales.  Musculoskeletal: Normal range of motion.  Lymphadenopathy:    She has no cervical adenopathy.  Neurological: She is alert.   Skin: Skin is warm and dry. She is not diaphoretic.  Nursing note and vitals reviewed.   Urgent Care Course   Clinical Course     Procedures (including critical care time)  Labs Review Labs Reviewed - No data to display  Imaging Review No results found.    MDM   1. Acute non-recurrent maxillary sinusitis    Pt c/o 2 weeks of worsening sinus congestion, pain and pressure.   Hx and exam c/w sinusitis. Rx: doxycycline and prednisone (has had both before w/o side effects) F/u with PCP in 1 week if not improving, sooner if worsening.     Junius Finnerrin O'Malley, PA-C 09/13/16 1603    Junius FinnerErin O'Malley, PA-C 09/13/16 [phone]

## 2016-09-13 NOTE — ED Triage Notes (Signed)
Patient c/o 2 weeks of facial pain, sinus congestion ear fullness and HA. Afebrile. Taken Aleve and Benadryl otc.

## 2016-09-28 ENCOUNTER — Other Ambulatory Visit: Payer: Self-pay

## 2016-09-28 ENCOUNTER — Other Ambulatory Visit: Payer: Self-pay | Admitting: *Deleted

## 2016-09-28 DIAGNOSIS — F32A Depression, unspecified: Secondary | ICD-10-CM

## 2016-09-28 DIAGNOSIS — F419 Anxiety disorder, unspecified: Principal | ICD-10-CM

## 2016-09-28 DIAGNOSIS — F329 Major depressive disorder, single episode, unspecified: Secondary | ICD-10-CM

## 2016-09-28 MED ORDER — BREXPIPRAZOLE 3 MG PO TABS: 3.0000 mg | ORAL_TABLET | Freq: Every day | ORAL | 2 refills | Status: DC

## 2016-09-28 MED ORDER — BREXPIPRAZOLE 3 MG PO TABS: 3.0000 mg | ORAL_TABLET | Freq: Every day | ORAL | 0 refills | Status: DC

## 2016-09-28 NOTE — Progress Notes (Signed)
rx reordered under Tandy GawJade Breeback, PA-C.

## 2016-09-29 ENCOUNTER — Other Ambulatory Visit: Payer: Self-pay | Admitting: Family Medicine

## 2016-10-28 ENCOUNTER — Other Ambulatory Visit: Payer: Self-pay | Admitting: *Deleted

## 2016-10-28 MED ORDER — PANTOPRAZOLE SODIUM 40 MG PO TBEC: 40.0000 mg | DELAYED_RELEASE_TABLET | Freq: Every day | ORAL | 3 refills | Status: DC

## 2016-10-31 ENCOUNTER — Other Ambulatory Visit: Payer: Self-pay | Admitting: Physician Assistant

## 2016-10-31 MED ORDER — ZOLPIDEM TARTRATE 10 MG PO TABS: 10.0000 mg | ORAL_TABLET | Freq: Every evening | ORAL | 5 refills | Status: DC | PRN

## 2016-11-08 ENCOUNTER — Other Ambulatory Visit: Payer: Self-pay | Admitting: Physician Assistant

## 2016-11-23 ENCOUNTER — Ambulatory Visit (INDEPENDENT_AMBULATORY_CARE_PROVIDER_SITE_OTHER): Payer: Medicaid Other | Admitting: Family Medicine

## 2016-11-23 ENCOUNTER — Encounter: Payer: Self-pay | Admitting: Family Medicine

## 2016-11-23 ENCOUNTER — Ambulatory Visit (INDEPENDENT_AMBULATORY_CARE_PROVIDER_SITE_OTHER): Payer: Medicaid Other

## 2016-11-23 VITALS — BP 130/59 | HR 68 | Wt 139.0 lb

## 2016-11-23 DIAGNOSIS — M25512 Pain in left shoulder: Secondary | ICD-10-CM

## 2016-11-23 DIAGNOSIS — Z23 Encounter for immunization: Secondary | ICD-10-CM | POA: Diagnosis not present

## 2016-11-23 DIAGNOSIS — M25511 Pain in right shoulder: Secondary | ICD-10-CM | POA: Diagnosis not present

## 2016-11-23 NOTE — Progress Notes (Signed)
Rachael Douglas is a 61 y.o. female who presents to [facility] today for shoulder pain bilateral. Symptoms have been ongoing now for 2 weeks. The left shoulder is much worse than the right. Pain is worse with overhead motion reaching back and reaching across her body. She denies any injury. She denies radiating pain weakness or numbness. Taking her narcotic pain medicine which has helped a little. She feels well otherwise.    Past Medical History:  Diagnosis Date  . Arthritis   . Heart disease   . Hyperlipidemia   . Hypertension   . PUD (peptic ulcer disease)   . Thyroid disease    Past Surgical History:  Procedure Laterality Date  . AORTIC VALVE REPLACEMENT    . TOTAL ABDOMINAL HYSTERECTOMY     Social History  Substance Use Topics  . Smoking status: Former Smoker    Types: Cigarettes    Quit date: 05/30/2013  . Smokeless tobacco: Never Used  . Alcohol use No     ROS:  As above   Medications: Current Outpatient Prescriptions  Medication Sig Dispense Refill  . albuterol (PROAIR HFA) 108 (90 BASE) MCG/ACT inhaler INHALE 2 PUFFS EVERY 6 HOURS AS NEEDED    . ALPRAZolam (XANAX) 1 MG tablet take 1 tablet by mouth at bedtime AND NO MORE THAN ONE DURING THE DAY ONLY AS NEEDED FOR ANXIETY 60 tablet 1  . aspirin EC 325 MG tablet Take 1 tablet (325 mg total) by mouth daily. TAKE 1 TABLET DAILY 90 tablet 2  . Brexpiprazole (REXULTI) 3 MG TABS Take 3 mg by mouth daily. Need to establish with new PCP before future refills 30 tablet 2  . dicyclomine (BENTYL) 10 MG capsule Take 1 capsule (10 mg total) by mouth 3 (three) times daily before meals. 90 capsule 0  . doxycycline (VIBRAMYCIN) 100 MG capsule Take 1 capsule (100 mg total) by mouth 2 (two) times daily. One po bid x 7 days 14 capsule 0  . DULoxetine (CYMBALTA) 60 MG capsule take 1 capsule by mouth once daily 30 capsule 2  . furosemide (LASIX) 40 MG tablet Take 1 tablet (40 mg total) by  mouth daily as needed for edema. 90 tablet 1  . gabapentin (NEURONTIN) 600 MG tablet Take 2 tablets (1,200 mg total) by mouth 3 (three) times daily. 540 tablet 1  . HYDROcodone-homatropine (HYCODAN) 5-1.5 MG/5ML syrup Take 5 mLs by mouth every 6 (six) hours as needed for cough.    . levothyroxine (SYNTHROID, LEVOTHROID) 75 MCG tablet Take 1 tablet (75 mcg total) by mouth daily before breakfast. 90 tablet 3  . linaclotide (LINZESS) 290 MCG CAPS capsule Take 1 capsule (290 mcg total) by mouth daily. 90 capsule 1  . meloxicam (MOBIC) 7.5 MG tablet Take 1-2 tablets (7.5-15 mg total) by mouth daily as needed for pain. 180 tablet 3  . mometasone-formoterol (DULERA) 200-5 MCG/ACT AERO Inhale 1 puff into the lungs daily. 3 Inhaler 3  . Omega 3 1200 MG CAPS Take by mouth.    . pantoprazole (PROTONIX) 40 MG tablet Take 1 tablet (40 mg total) by mouth daily. 30 tablet 3  . Potassium 75 MG TABS Take by mouth as directed.    . pravastatin (PRAVACHOL) 80 MG tablet take 1 tablet by mouth once daily 90 tablet 3  . sertraline (ZOLOFT) 100 MG tablet Take 100 mg by mouth daily.    [name]. triamcinolone cream (KENALOG) 0.1 % Apply topically 2 (two) times daily.  AVOID FACE. 80 g 1  . zolpidem (AMBIEN) 10 MG tablet Take 1 tablet (10 mg total) by mouth at bedtime as needed for sleep. 30 tablet 5  . metoprolol tartrate (LOPRESSOR) 25 MG tablet Take 1 tablet (25 mg total) by mouth 2 (two) times daily. 180 tablet 3   No current facility-administered medications for this visit.    Allergies  Allergen Reactions  . Penicillins Swelling  . Alendronate Other (See Comments)    unknown  . Ranitidine Other (See Comments)    Facial swelling     Exam:  BP (!) 130/59   Pulse 68   Wt 139 lb (63 kg)   BMI 23.86 kg/m  General: Well Developed, well nourished, and in no acute distress.  Neuro/Psych: Alert and oriented x3, extra-ocular muscles intact, able to move all 4 extremities, sensation grossly intact. Skin: Warm and dry,  no rashes noted.  Respiratory: Not using accessory muscles, speaking in full sentences, trachea midline.  Cardiovascular: Pulses palpable, no extremity edema. Abdomen: Does not appear distended. MSK:   Left shoulder: Normal-appearing. Tender palpation overlying the acromioclavicular joint. Region motion is impaired. Normal external rotation. Internal rotation to the iliacs crest. Abduction to 110. Positive Hawkins and Neer's test.  Positive crossover arm compression test. Positive Yergason's and speeds test. Strength is intact.   Right shoulder: Normal-appearing nontender decreased abduction motion due to pain. Mildly positive Hawkins and Neer's test. Negative Yergason's and speeds test. Normal strength.  Procedure: Real-time Ultrasound Guided Injection of left subacromial brusa  Device: GE Logiq E  Images permanently stored and available for review in the ultrasound unit. Verbal informed consent obtained. Discussed risks and benefits of procedure. Warned about infection bleeding damage to structures skin hypopigmentation and fat atrophy among others. Patient expresses understanding and agreement Time-out conducted.  Noted no overlying erythema, induration, or other signs of local infection.  Skin prepped in a sterile fashion.  Local anesthesia: Topical Ethyl chloride.  With sterile technique and under real time ultrasound guidance: 40mg  Kenalog and 3 mL of Marcaine injected easily.  Completed without difficulty  Pain partially resolved suggesting accurate placement of the medication.  Advised to call if fevers/chills, erythema, induration, drainage, or persistent bleeding.  Images permanently stored and available for review in the ultrasound unit.  Impression: Technically successful ultrasound guided injection.     No results found for this or any previous visit (from the past 48 hour(s)). Dg Shoulder Right  Result Date: 11/23/2016 CLINICAL DATA:  Bilateral shoulder  pain for 1 month, no known injury, initial encounter EXAM: RIGHT SHOULDER - 2+ VIEW COMPARISON:  None. FINDINGS: Mild degenerative changes of the acromioclavicular joint are seen. No fracture or dislocation is seen. The underlying bony thorax is within normal limits. No acute abnormality noted. IMPRESSION: No acute abnormality noted. Electronically Signed   By: Alcide Clever M.D.   On: 11/23/2016 15:48   Dg Shoulder Left  Result Date: 11/23/2016 CLINICAL DATA:  Left shoulder pain for 1 month, no known injury, initial encounter EXAM: LEFT SHOULDER - 2+ VIEW COMPARISON:  None. FINDINGS: There is no evidence of fracture or dislocation. There is no evidence of arthropathy or other focal bone abnormality. Soft tissues are unremarkable. IMPRESSION: No acute abnormality noted. Electronically Signed   By: Alcide Clever M.D.   On: 11/23/2016 15:49      Assessment and Plan: 61 y.o. female with  Left shoulder pain: Unclear etiology. Patient likely has several different potential causes for pain including rotator cuff tendinopathy,  acromioclavicular DJD, bicipital tendinitis. Patient had only incomplete response to subacromial injection today. Plan for physical therapy and recheck in about 2 weeks.  Right shoulder pain: Bursitis possibly. Physical therapy should help. Potential injection in the near future.     Orders Placed This Encounter  Procedures  . DG Shoulder Right    Standing Status:   Future    Number of Occurrences:   1    Standing Expiration Date:   01/23/2018    Order Specific Question:   Reason for Exam (SYMPTOM  OR DIAGNOSIS REQUIRED)    Answer:   eval pain    Order Specific Question:   Preferred imaging location?    Answer:   [facility]  . DG Shoulder Left    Standing Status:   Future    Number of Occurrences:   1    Standing Expiration Date:   01/23/2018    Order Specific Question:   Reason for Exam (SYMPTOM  OR DIAGNOSIS REQUIRED)    Answer:   eval pain    Order Specific  Question:   Preferred imaging location?    Answer:   [facility]  . Ambulatory referral to Physical Therapy    Referral Priority:   Routine    Referral Type:   Physical Medicine    Referral Reason:   Specialty Services Required    Requested Specialty:   Physical Therapy    Number of Visits Requested:   1    Discussed warning signs or symptoms. Please see discharge instructions. Patient expresses understanding.   CC: Tandy Gaw, PA-C

## 2016-11-23 NOTE — Patient Instructions (Signed)
Thank you for coming in today. Get xray today.  Attend PT.  Recheck in 2 weeks.  Call or go to the ER if you develop a large red swollen joint with extreme pain or oozing puss.    Shoulder Impingement Syndrome Shoulder impingement syndrome is a condition that causes pain when connective tissues (tendons) surrounding the shoulder joint become pinched. These tendons are part of the group of muscles and tissues that help to stabilize the shoulder (rotator cuff). Beneath the rotator cuff is a fluid-filled sac (bursa) that allows the muscles and tendons to glide smoothly. The bursa may become swollen or irritated (bursitis). Bursitis, swelling in the rotator cuff tendons, or both conditions can decrease how much space is under a bone in the shoulder joint (acromion), resulting in impingement. What are the causes? Shoulder impingement syndrome can be caused by bursitis or swelling of the rotator cuff tendons, which may result from:  Repetitive overhead arm movements.  Falling onto the shoulder.  Weakness in the shoulder muscles. What increases the risk? You may be more likely to develop this condition if you are an athlete who participates in:  Sports that involve throwing, such as baseball.  Tennis.  Swimming.  Volleyball. Some people are also more likely to develop impingement syndrome because of the shape of their acromion bone. What are the signs or symptoms? The main symptom of this condition is pain on the front or side of the shoulder. Pain may:  Get worse when lifting or raising the arm.  Get worse at night.  Wake you up from sleeping.  Feel sharp when the shoulder is moved, and then fade to an ache. Other signs and symptoms may include:  Tenderness.  Stiffness.  Inability to raise the arm above shoulder level or behind the body.  Weakness. How is this diagnosed? This condition may be diagnosed based on:  Your symptoms.  Your medical history.  A physical  exam.  Imaging tests, such as:  X-rays.  MRI.  Ultrasound. How is this treated? Treatment for this condition may include:  Resting your shoulder and avoiding all activities that cause pain or put stress on the shoulder.  Icing your shoulder.  NSAIDs to help reduce pain and swelling.  One or more injections of medicines to numb the area and reduce inflammation.  Physical therapy.  Surgery. This may be needed if nonsurgical treatments have not helped. Surgery may involve repairing the rotator cuff, reshaping the acromion, or removing the bursa. Follow these instructions at home: Managing pain, stiffness, and swelling   If directed, apply ice to the injured area.  Put ice in a plastic bag.  Place a towel between your skin and the bag.  Leave the ice on for 20 minutes, 2-3 times a day. Activity   Rest and return to your normal activities as told by your health care provider. Ask your health care provider what activities are safe for you.  Do exercises as told by your health care provider. General instructions   Do not use any tobacco products, including cigarettes, chewing tobacco, or e-cigarettes. Tobacco can delay healing. If you need help quitting, ask your health care provider.  Ask your health care provider when it is safe for you to drive.  Take over-the-counter and prescription medicines only as told by your health care provider.  Keep all follow-up visits as told by your health care provider. This is important. How is this prevented?  Give your body time to rest between periods of  activity.  Be safe and responsible while being active to avoid falls.  Maintain physical fitness, including strength and flexibility. Contact a health care provider if:  Your symptoms have not improved after 1-2 months of treatment and rest.  You cannot lift your arm away from your body. This information is not intended to replace advice given to you by your health care  provider. Make sure you discuss any questions you have with your health care provider. Document Released: 09/05/2005 Document Revised: 05/12/2016 Document Reviewed: 08/08/2015 Elsevier Interactive Patient Education  2017 [employer].

## 2016-11-24 NOTE — Addendum Note (Signed)
Addended by: Minna AntisBRIGHAM, Elan Mcelvain T on: 11/24/2016 05:24 PM   Modules accepted: Orders

## 2016-11-28 ENCOUNTER — Encounter: Payer: Self-pay | Admitting: Physician Assistant

## 2016-12-07 ENCOUNTER — Ambulatory Visit (INDEPENDENT_AMBULATORY_CARE_PROVIDER_SITE_OTHER): Payer: Medicaid Other | Admitting: Physician Assistant

## 2016-12-07 ENCOUNTER — Encounter: Payer: Self-pay | Admitting: Physician Assistant

## 2016-12-07 ENCOUNTER — Ambulatory Visit (INDEPENDENT_AMBULATORY_CARE_PROVIDER_SITE_OTHER): Payer: Medicaid Other | Admitting: Family Medicine

## 2016-12-07 VITALS — BP 122/75 | HR 68

## 2016-12-07 VITALS — BP 122/75 | HR 68 | Ht 64.0 in | Wt 138.0 lb

## 2016-12-07 DIAGNOSIS — E039 Hypothyroidism, unspecified: Secondary | ICD-10-CM | POA: Diagnosis not present

## 2016-12-07 DIAGNOSIS — R300 Dysuria: Secondary | ICD-10-CM

## 2016-12-07 DIAGNOSIS — Z1159 Encounter for screening for other viral diseases: Secondary | ICD-10-CM

## 2016-12-07 DIAGNOSIS — M7062 Trochanteric bursitis, left hip: Secondary | ICD-10-CM | POA: Diagnosis not present

## 2016-12-07 DIAGNOSIS — N3001 Acute cystitis with hematuria: Secondary | ICD-10-CM

## 2016-12-07 DIAGNOSIS — Z1231 Encounter for screening mammogram for malignant neoplasm of breast: Secondary | ICD-10-CM | POA: Diagnosis not present

## 2016-12-07 DIAGNOSIS — M7061 Trochanteric bursitis, right hip: Secondary | ICD-10-CM | POA: Diagnosis not present

## 2016-12-07 DIAGNOSIS — Z131 Encounter for screening for diabetes mellitus: Secondary | ICD-10-CM

## 2016-12-07 DIAGNOSIS — Z0000 Encounter for general adult medical examination without abnormal findings: Secondary | ICD-10-CM | POA: Diagnosis not present

## 2016-12-07 DIAGNOSIS — M25512 Pain in left shoulder: Secondary | ICD-10-CM | POA: Diagnosis not present

## 2016-12-07 LAB — POCT URINALYSIS DIPSTICK
Bilirubin, UA: NEGATIVE
GLUCOSE UA: NEGATIVE
Ketones, UA: NEGATIVE
Nitrite, UA: POSITIVE
Protein, UA: NEGATIVE
Spec Grav, UA: 1.01 (ref 1.030–1.035)
Urobilinogen, UA: 0.2 (ref ?–2.0)
pH, UA: 7 (ref 5.0–8.0)

## 2016-12-07 MED ORDER — NITROFURANTOIN MONOHYD MACRO 100 MG PO CAPS: 100.0000 mg | ORAL_CAPSULE | Freq: Two times a day (BID) | ORAL | 0 refills | Status: DC

## 2016-12-07 NOTE — Progress Notes (Signed)
Rachael Douglas is a 61 y.o. female who presents to [facility] today for follow-up left shoulder pain and discuss buttocks pain.  Shoulder pain: Patient was seen a few weeks ago for left shoulder pain. She had a subacromial bursa injection that moderately reduced her pain. She continues to have pain especially with elbow flexion forearm supination reaching back and reaching up. She has pain at night. She denies radiating pain.  Additionally she has bilateral buttocks pain. She points to her greater trochanter region and her initial tuberosity as a source of her pain. She notes the pain occurs when she lies on her side at night and when she sits and when she stands from a seated position. She denies radiating pain weakness or numbness to her legs. She denies injury.   Past Medical History:  Diagnosis Date  . Arthritis   . Heart disease   . Hyperlipidemia   . Hypertension   . PUD (peptic ulcer disease)   . Thyroid disease    Past Surgical History:  Procedure Laterality Date  . AORTIC VALVE REPLACEMENT    . TOTAL ABDOMINAL HYSTERECTOMY     Social History  Substance Use Topics  . Smoking status: Former Smoker    Types: Cigarettes    Quit date: 05/30/2013  . Smokeless tobacco: Never Used  . Alcohol use No     ROS:  As above   Medications: Current Outpatient Prescriptions  Medication Sig Dispense Refill  . albuterol (PROAIR HFA) 108 (90 BASE) MCG/ACT inhaler INHALE 2 PUFFS EVERY 6 HOURS AS NEEDED    . ALPRAZolam (XANAX) 1 MG tablet take 1 tablet by mouth at bedtime AND NO MORE THAN ONE DURING THE DAY ONLY AS NEEDED FOR ANXIETY 60 tablet 1  . aspirin EC 325 MG tablet Take 1 tablet (325 mg total) by mouth daily. TAKE 1 TABLET DAILY 90 tablet 2  . Brexpiprazole (REXULTI) 3 MG TABS Take 3 mg by mouth daily. Need to establish with new PCP before future refills 30 tablet 2  . DULoxetine (CYMBALTA) 60 MG capsule take 1 capsule by mouth  once daily 30 capsule 2  . furosemide (LASIX) 40 MG tablet Take 1 tablet (40 mg total) by mouth daily as needed for edema. 90 tablet 1  . gabapentin (NEURONTIN) 600 MG tablet Take 2 tablets (1,200 mg total) by mouth 3 (three) times daily. 540 tablet 1  . levothyroxine (SYNTHROID, LEVOTHROID) 75 MCG tablet Take 1 tablet (75 mcg total) by mouth daily before breakfast. 90 tablet 3  . linaclotide (LINZESS) 290 MCG CAPS capsule Take 1 capsule (290 mcg total) by mouth daily. 90 capsule 1  . meloxicam (MOBIC) 7.5 MG tablet Take 1-2 tablets (7.5-15 mg total) by mouth daily as needed for pain. 180 tablet 3  . metoprolol tartrate (LOPRESSOR) 25 MG tablet Take 1 tablet (25 mg total) by mouth 2 (two) times daily. 180 tablet 3  . mometasone-formoterol (DULERA) 200-5 MCG/ACT AERO Inhale 1 puff into the lungs daily. 3 Inhaler 3  . nitrofurantoin, macrocrystal-monohydrate, (MACROBID) 100 MG capsule Take 1 capsule (100 mg total) by mouth 2 (two) times daily. For 7 days. 14 capsule 0  . Omega 3 1200 MG CAPS Take by mouth.    . pantoprazole (PROTONIX) 40 MG tablet Take 1 tablet (40 mg total) by mouth daily. 30 tablet 3  . Potassium 75 MG TABS Take by mouth as directed.    . pravastatin (PRAVACHOL) 80 MG tablet take 1  tablet by mouth once daily 90 tablet 3  . sertraline (ZOLOFT) 100 MG tablet Take 100 mg by mouth daily.    [name] triamcinolone cream (KENALOG) 0.1 % Apply topically 2 (two) times daily. AVOID FACE. 80 g 1  . zolpidem (AMBIEN) 10 MG tablet Take 1 tablet (10 mg total) by mouth at bedtime as needed for sleep. 30 tablet 5   No current facility-administered medications for this visit.    Allergies  Allergen Reactions  . Penicillins Swelling  . Alendronate Other (See Comments)    unknown  . Ranitidine Other (See Comments)    Facial swelling     Exam:  BP 122/75   Pulse 68   General: Well Developed, well nourished, and in no acute distress.  Neuro/Psych: Alert and oriented x3, extra-ocular muscles  intact, able to move all 4 extremities, sensation grossly intact. Skin: Warm and dry, no rashes noted.  Respiratory: Not using accessory muscles, speaking in full sentences, trachea midline.  Cardiovascular: Pulses palpable, no extremity edema. Abdomen: Does not appear distended. MSK:   Left shoulder: Normal-appearing. Tender palpation overlying the  bicipital groove. Region motion is impaired. Normal external rotation. Internal rotation to the iliacs crest. Abduction to 110. Positive Hawkins and Neer's test.  Positive crossover arm compression test. Positive Yergason's and speeds test. Strength is intact.   Pelvis: Normal hip motion. Tender palpation greater trochanter initial tuberosities bilaterally. Pain with IT band and figure for stretching. Hip abduction is significantly weak 4/5 bilaterally.  Results for orders placed or performed in visit on 12/07/16 (from the past 48 hour(s))  POCT urinalysis dipstick     Status: Abnormal   Collection Time: 12/07/16  1:54 PM  Result Value Ref Range   Color, UA yellow    Clarity, UA cloudy    Glucose, UA neg    Bilirubin, UA neg    Ketones, UA neg    Spec Grav, UA 1.010 1.030 - 1.035   Blood, UA trace-intact    pH, UA 7.0 5.0 - 8.0   Protein, UA neg    Urobilinogen, UA 0.2 Negative - 2.0   Nitrite, UA pos    Leukocytes, UA moderate (2+) (A) Negative   No results found.    Assessment and Plan: 61 y.o. female with  Left shoulder pain: Likely multifactorial suspect bicipital tendinopathy as well as rotator cuff tendinopathy. X-ray was unremarkable recently. Plan for MRI to further evaluate potential treatment options.  Buttocks pain: Very likely greater trochanter regional pain syndrome type. She additionally seems to have pain at the base of tuberosity consistent with exertional hamstring tendinopathy. Review of imaging shows CT scan of her abdomen and pelvis December 2017 that did not show any obvious bony abnormality. Plan to work  on home physical therapy exercises and recheck in a few weeks.    Orders Placed This Encounter  Procedures  . MR SHOULDER LEFT WO CONTRAST    Standing Status:   Future    Standing Expiration Date:   02/06/2018    Order Specific Question:   Does the patient have a pacemaker or implanted devices?    Answer:   No    Order Specific Question:   Preferred imaging location?    Answer:   [facility] (table limit-350lbs)    Order Specific Question:   Reason for exam:    Answer:   eval left shoulder pain suspect rtc tendonitis and biceps tendoitins    Discussed warning signs or symptoms. Please see discharge instructions. Patient  expresses understanding.

## 2016-12-07 NOTE — Patient Instructions (Signed)
Thank you for coming in today. Do the side leg raises  Do the stretching we discussed.  Get MRI.  Follow up a few days after the MRI.    Trochanteric Bursitis Rehab Ask your health care provider which exercises are safe for you. Do exercises exactly as told by your health care provider and adjust them as directed. It is normal to feel mild stretching, pulling, tightness, or discomfort as you do these exercises, but you should stop right away if you feel sudden pain or your pain gets worse.Do not begin these exercises until told by your health care provider. Stretching exercises These exercises warm up your muscles and joints and improve the movement and flexibility of your hip. These exercises also help to relieve pain and stiffness. Exercise A: Iliotibial band stretch   1. Lie on your side with your left / right leg in the top position. 2. Bend your left / right knee and grab your ankle. 3. Slowly bring your knee back so your thigh is behind your body. 4. Slowly lower your knee toward the floor until you feel a gentle stretch on the outside of your left / right thigh. If you do not feel a stretch and your knee will not fall farther, place the heel of your other foot on top of your outer knee and pull your thigh down farther. 5. Hold this position for __________ seconds. 6. Slowly return to the starting position. Repeat __________ times. Complete this exercise __________ times a day. Strengthening exercises These exercises build strength and endurance in your hip and pelvis. Endurance is the ability to use your muscles for a long time, even after they get tired. Exercise B: Bridge (  hip extensors) 1. Lie on your back on a firm surface with your knees bent and your feet flat on the floor. 2. Tighten your buttocks muscles and lift your buttocks off the floor until your trunk is level with your thighs. You should feel the muscles working in your buttocks and the back of your thighs. If this  exercise is too easy, try doing it with your arms crossed over your chest. 3. Hold this position for __________ seconds. 4. Slowly return to the starting position. 5. Let your muscles relax completely between repetitions. Repeat __________ times. Complete this exercise __________ times a day. Exercise C: Squats ( knee extensors and  quadriceps) 1. Stand in front of a table, with your feet and knees pointing straight ahead. You may rest your hands on the table for balance but not for support. 2. Slowly bend your knees and lower your hips like you are going to sit in a chair.  Keep your weight over your heels, not over your toes.  Keep your lower legs upright so they are parallel with the table legs.  Do not let your hips go lower than your knees.  Do not bend lower than told by your health care provider.  If your hip pain increases, do not bend as low. 3. Hold this position for __________ seconds. 4. Slowly push with your legs to return to standing. Do not use your hands to pull yourself to standing. Repeat __________ times. Complete this exercise __________ times a day. Exercise D: Hip hike 1. Stand sideways on a bottom step. Stand on your left / right leg with your other foot unsupported next to the step. You can hold onto the railing or wall if needed for balance. 2. Keeping your knees straight and your torso square, lift  your left / right hip up toward the ceiling. 3. Hold this position for __________ seconds. 4. Slowly let your left / right hip lower toward the floor, past the starting position. Your foot should get closer to the floor. Do not lean or bend your knees. Repeat __________ times. Complete this exercise __________ times a day. Exercise E: Single leg stand 1. Stand near a counter or door frame that you can hold onto for balance as needed. It is helpful to stand in front of a mirror for this exercise so you can watch your hip. 2. Squeeze your left / right buttock muscles  then lift up your other foot. Do not let your left / right hip push out to the side. 3. Hold this position for __________ seconds. Repeat __________ times. Complete this exercise __________ times a day. This information is not intended to replace advice given to you by your health care provider. Make sure you discuss any questions you have with your health care provider. Document Released: 10/13/2004 Document Revised: 05/12/2016 Document Reviewed: 08/21/2015 Elsevier Interactive Patient Education  2017 [employer].

## 2016-12-07 NOTE — Patient Instructions (Addendum)
Keeping You Healthy  Get These Tests  Blood Pressure- Have your blood pressure checked by your healthcare provider at least once a year.  Normal blood pressure is 120/80.  Weight- Have your body mass index (BMI) calculated to screen for obesity.  BMI is a measure of body fat based on height and weight.  You can calculate your own BMI at www.nhlbisupport.com/bmi/  Cholesterol- Have your cholesterol checked every year.  Diabetes- Have your blood sugar checked every year if you have high blood pressure, high cholesterol, a family history of diabetes or if you are overweight.  Pap Test - Have a pap test every 1 to 5 years if you have been sexually active.  If you are older than 62 and recent pap tests have been normal you may not need additional pap tests.  In addition, if you have had a hysterectomy  for benign disease additional pap tests are not necessary.  Mammogram-Yearly mammograms are essential for early detection of breast cancer  Screening for Colon Cancer- Colonoscopy starting at age 50. Screening may begin sooner depending on your family history and other health conditions.  Follow up colonoscopy as directed by your Gastroenterologist.  Screening for Osteoporosis- Screening begins at age 62 with bone density scanning, sooner if you are at higher risk for developing Osteoporosis.  Get these medicines  Calcium with Vitamin D- Your body requires 1200-1500 mg of Calcium a day and 800-1000 IU of Vitamin D a day.  You can only absorb 500 mg of Calcium at a time therefore Calcium must be taken in 2 or 3 separate doses throughout the day.  Hormones- Hormone therapy has been associated with increased risk for certain cancers and heart disease.  Talk to your healthcare provider about if you need relief from menopausal symptoms.  Aspirin- Ask your healthcare provider about taking Aspirin to prevent Heart Disease and Stroke.  Get these Immuniztions  Flu shot- Every fall  Pneumonia shot-  Once after the age of 62; if you are younger ask your healthcare provider if you need a pneumonia shot.  Tetanus- Every ten years.  Zostavax- Once after the age of 62 to prevent shingles.  Take these steps  Don't smoke- Your healthcare provider can help you quit. For tips on how to quit, ask your healthcare provider or go to www.smokefree.gov or call 1-800 QUIT-NOW.  Be physically active- Exercise 5 days a week for a minimum of 30 minutes.  If you are not already physically active, start slow and gradually work up to 30 minutes of moderate physical activity.  Try walking, dancing, bike riding, swimming, etc.  Eat a healthy diet- Eat a variety of healthy foods such as fruits, vegetables, whole grains, low fat milk, low fat cheeses, yogurt, lean meats, chicken, fish, eggs, dried beans, tofu, etc.  For more information go to www.thenutritionsource.org  Dental visit- Brush and floss teeth twice daily; visit your dentist twice a year.  Eye exam- Visit your Optometrist or Ophthalmologist yearly.  Drink alcohol in moderation- Limit alcohol intake to one drink or less a day.  Never drink and drive.  Depression- Your emotional health is as important as your physical health.  If you're feeling down or losing interest in things you normally enjoy, please talk to your healthcare provider.  Seat Belts- can save your life; always wear one  Smoke/Carbon Monoxide detectors- These detectors need to be installed on the appropriate level of your home.  Replace batteries at least once a year.  Violence- If   anyone is threatening or hurting you, please tell your healthcare provider.  Living Will/ Health care power of attorney- Discuss with your healthcare provider and family.   Urinary Tract Infection, Adult A urinary tract infection (UTI) is an infection of any part of the urinary tract. The urinary tract includes the:  Kidneys.  Ureters.  Bladder.  Urethra. These organs make, store, and get rid  of pee (urine) in the body. Follow these instructions at home:  Take over-the-counter and prescription medicines only as told by your doctor.  If you were prescribed an antibiotic medicine, take it as told by your doctor. Do not stop taking the antibiotic even if you start to feel better.  Avoid the following drinks:  Alcohol.  Caffeine.  Tea.  Carbonated drinks.  Drink enough fluid to keep your pee clear or pale yellow.  Keep all follow-up visits as told by your doctor. This is important.  Make sure to:  Empty your bladder often and completely. Do not to hold pee for long periods of time.  Empty your bladder before and after sex.  Wipe from front to back after a bowel movement if you are female. Use each tissue one time when you wipe. Contact a doctor if:  You have back pain.  You have a fever.  You feel sick to your stomach (nauseous).  You throw up (vomit).  Your symptoms do not get better after 3 days.  Your symptoms go away and then come back. Get help right away if:  You have very bad back pain.  You have very bad lower belly (abdominal) pain.  You are throwing up and cannot keep down any medicines or water. This information is not intended to replace advice given to you by your health care provider. Make sure you discuss any questions you have with your health care provider. Document Released: 02/22/2008 Document Revised: 02/11/2016 Document Reviewed: 07/27/2015 Elsevier Interactive Patient Education  2017 [employer]

## 2016-12-07 NOTE — Progress Notes (Signed)
Subjective:     Rachael Douglas is a 61 y.o. female and is here for a comprehensive physical exam. The patient reports problems - pt is having dysuria, lower abdominal pain, fatigue, nausea for at least a week.  she wonders if her thyroid could be off. Last time was low she felt so fatigue with no energy.   Social History   Social History  . Marital status: Married    Spouse name: N/A  . Number of children: 2  . Years of education: N/A   Occupational History  . Not on file.   Social History Main Topics  . Smoking status: Former Smoker    Types: Cigarettes    Quit date: 05/30/2013  . Smokeless tobacco: Never Used  . Alcohol use No  . Drug use: No  . Sexual activity: Not Currently   Other Topics Concern  . Not on file   Social History Narrative  . No narrative on file   Health Maintenance  Topic Date Due  . Hepatitis C Screening  [birth year]  . COLONOSCOPY  12/06/2004  . MAMMOGRAM  10/01/2016  . HIV Screening  12/08/2026 (Originally 12/06/1970)  . TETANUS/TDAP  01/31/2022  . INFLUENZA VACCINE  Completed    The following portions of the patient's history were reviewed and updated as appropriate: allergies, current medications, past family history, past medical history, past social history, past surgical history and problem list.  Review of Systems Pertinent items noted in HPI and remainder of comprehensive ROS otherwise negative.   Objective:    BP 122/75   Pulse 68   Ht 5\' 4"  (1.626 m)   Wt 138 lb (62.6 kg)   BMI 23.69 kg/m  General appearance: alert, cooperative and appears stated age Head: Normocephalic, without obvious abnormality, atraumatic Eyes: conjunctivae/corneas clear. PERRL, EOM's intact. Fundi benign. Ears: normal TM's and external ear canals both ears Nose: Nares normal. Septum midline. Mucosa normal. No drainage or sinus tenderness. Throat: lips, mucosa, and tongue normal; teeth and gums normal Neck: no adenopathy, no carotid bruit, no JVD, supple,  symmetrical, trachea midline and thyroid not enlarged, symmetric, no tenderness/mass/nodules Back: CVA tenderness bilaterally.  Lungs: clear to auscultation bilaterally Breasts: normal appearance, no masses or tenderness Heart: regular rate and rhythm, S1, S2 normal, no murmur, click, rub or gallop Abdomen: mild tenderness over suprapubic area.  Extremities: extremities normal, atraumatic, no cyanosis or edema Pulses: 2+ and symmetric Skin: Skin color, texture, turgor normal. No rashes or lesions Lymph nodes: Cervical, supraclavicular, and axillary nodes normal. Neurologic: Alert and oriented X 3, normal strength and tone. Normal symmetric reflexes. Normal coordination and gait    Assessment:    Healthy female exam.      Plan:    [name].[name].Marcela was seen today for annual exam and dysuria.  Diagnoses and all orders for this visit:  Routine physical examination -     Lipid panel -     COMPLETE METABOLIC PANEL WITH GFR -     TSH -     Hepatitis C Antibody -     T4, free -     VITAMIN D 25 Hydroxy (Vit-D Deficiency, Fractures) -     B12  Acute cystitis with hematuria -     nitrofurantoin, macrocrystal-monohydrate, (MACROBID) 100 MG capsule; Take 1 capsule (100 mg total) by mouth 2 (two) times daily. For 7 days. -     Urine Culture  Hypothyroidism, unspecified type -     TSH -     T4,  free  Need for hepatitis C screening test -     Hepatitis C Antibody  Screening for diabetes mellitus -     COMPLETE METABOLIC PANEL WITH GFR  Visit for screening mammogram -     MM DIGITAL SCREENING BILATERAL; Future -     MM DIGITAL SCREENING BILATERAL  Dysuria -     POCT urinalysis dipstick -     Urine Culture   Will call and get colonoscopy report from [facility].  Discussed vitamin D and calcium.  Will call with lab reports.  Mammogram ordered.  See After Visit Summary for Counseling Recommendations

## 2016-12-08 LAB — LIPID PANEL
CHOLESTEROL: 152 mg/dL (ref <200)
HDL: 72 mg/dL (ref >50)
LDL Cholesterol: 67 mg/dL (ref <100)
Total CHOL/HDL Ratio: 2.1 Ratio (ref <5.0)
Triglycerides: 67 mg/dL (ref <150)
VLDL: 13 mg/dL (ref <30)

## 2016-12-08 LAB — COMPLETE METABOLIC PANEL WITH GFR
ALK PHOS: 61 U/L (ref 33–130)
ALT: 12 U/L (ref 6–29)
AST: 15 U/L (ref 10–35)
Albumin: 4.1 g/dL (ref 3.6–5.1)
BILIRUBIN TOTAL: 0.4 mg/dL (ref 0.2–1.2)
BUN: 17 mg/dL (ref 7–25)
CALCIUM: 8.8 mg/dL (ref 8.6–10.4)
CO2: 29 mmol/L (ref 20–31)
Chloride: 101 mmol/L (ref 98–110)
Creat: 1.05 mg/dL — ABNORMAL HIGH (ref 0.50–0.99)
GFR, EST NON AFRICAN AMERICAN: 57 mL/min — AB (ref >=60)
GFR, Est African American: 66 mL/min (ref >=60)
GLUCOSE: 79 mg/dL (ref 65–99)
Potassium: 4.1 mmol/L (ref 3.5–5.3)
SODIUM: 142 mmol/L (ref 135–146)
Total Protein: 6.6 g/dL (ref 6.1–8.1)

## 2016-12-08 LAB — T4, FREE: Free T4: 1.7 ng/dL (ref 0.8–1.8)

## 2016-12-08 LAB — TSH: TSH: 2.43 mIU/L

## 2016-12-09 LAB — VITAMIN D 25 HYDROXY (VIT D DEFICIENCY, FRACTURES): VIT D 25 HYDROXY: 14 ng/mL — AB (ref 30–100)

## 2016-12-09 LAB — VITAMIN B12: VITAMIN B 12: 375 pg/mL (ref 200–1100)

## 2016-12-09 LAB — HEPATITIS C ANTIBODY: HCV AB: NEGATIVE

## 2016-12-09 MED ORDER — LEVOTHYROXINE SODIUM 75 MCG PO TABS: 75.0000 ug | ORAL_TABLET | Freq: Every day | ORAL | 0 refills | Status: DC

## 2016-12-09 MED ORDER — VITAMIN D (ERGOCALCIFEROL) 1.25 MG (50000 UNIT) PO CAPS: 50000.0000 [IU] | ORAL_CAPSULE | ORAL | 0 refills | Status: DC

## 2016-12-09 NOTE — Progress Notes (Signed)
Call pt: hep C negative.  Vitamin D very low. You need high dose for 3 months then can recheck.  B12 normal.  Thyroid levels were perfect and free level was leaning towards a hyper thyroid level.  Kidney function has improved.  Cholesterol fantastic.   I believe fatigue could be vitamin D deficiency and UTI that you have. If get numbers normal and consider rechecking thyroid in 3 months.

## 2016-12-09 NOTE — Addendum Note (Signed)
Addended byJomarie Longs: BREEBACK, JADE L on: 12/09/2016 11:02 AM   Modules accepted: Orders

## 2016-12-10 LAB — URINE CULTURE

## 2016-12-20 ENCOUNTER — Telehealth: Payer: Self-pay

## 2016-12-20 ENCOUNTER — Ambulatory Visit: Payer: Self-pay

## 2016-12-20 MED ORDER — PLECANATIDE 3 MG PO TABS: 3.0000 mg | ORAL_TABLET | Freq: Every day | ORAL | 0 refills | Status: DC | PRN

## 2016-12-20 NOTE — Telephone Encounter (Signed)
Pt called stating that she is constipated.  She has a Rx for linzess but it is not working.  She is asking for a stronger dose or something different. Please advsie. -EH/RMA  If you respond after 12 please route to your CMA.

## 2016-12-20 NOTE — Telephone Encounter (Signed)
Trulance sent to [facility]. -EH/RMA

## 2016-12-20 NOTE — Telephone Encounter (Signed)
Lets try trulance  daily#90 NRF follow up in 6 weeks. Please send to pharmacy. Please give patient and coupon card up front that should make more affordable.

## 2016-12-21 ENCOUNTER — Telehealth: Payer: Self-pay | Admitting: Family Medicine

## 2016-12-21 DIAGNOSIS — M25511 Pain in right shoulder: Secondary | ICD-10-CM

## 2016-12-21 DIAGNOSIS — M25512 Pain in left shoulder: Secondary | ICD-10-CM

## 2016-12-21 NOTE — Telephone Encounter (Signed)
MRI denied.  Pt must have 6 weeks of supervised conservitive treatment.  We should be able to get the MRI done on or after April 23rd.  Will re-order MRI then.

## 2016-12-21 NOTE — Telephone Encounter (Signed)
Pt advised of denial. Pt states her insurance only paid for the first PT visit and she cannot afford to pay for the rest OOP. She is trying to do some home exercises.

## 2017-01-01 ENCOUNTER — Other Ambulatory Visit: Payer: Self-pay | Admitting: Physician Assistant

## 2017-01-05 ENCOUNTER — Other Ambulatory Visit: Payer: Self-pay | Admitting: *Deleted

## 2017-01-05 MED ORDER — FUROSEMIDE 40 MG PO TABS: 40.0000 mg | ORAL_TABLET | Freq: Every day | ORAL | 1 refills | Status: DC | PRN

## 2017-01-05 NOTE — Telephone Encounter (Signed)
Routing to Provider to see if MRI should be resubmitted. Pt did not complete PT due to cost, insurance only paid for the first/initial visit.

## 2017-01-06 NOTE — Telephone Encounter (Signed)
Started case and faxed clinicals.

## 2017-01-06 NOTE — Telephone Encounter (Signed)
Will reorder

## 2017-01-06 NOTE — Telephone Encounter (Signed)
Changed MR to corrected left shoulder. Right should MR is incorrect side.

## 2017-01-27 ENCOUNTER — Other Ambulatory Visit: Payer: Self-pay | Admitting: *Deleted

## 2017-01-27 MED ORDER — GABAPENTIN 600 MG PO TABS: 1200.0000 mg | ORAL_TABLET | Freq: Three times a day (TID) | ORAL | 1 refills | Status: DC

## 2017-02-01 ENCOUNTER — Other Ambulatory Visit: Payer: Self-pay | Admitting: Physician Assistant

## 2017-02-20 ENCOUNTER — Other Ambulatory Visit: Payer: Self-pay | Admitting: Physician Assistant

## 2017-02-20 DIAGNOSIS — F329 Major depressive disorder, single episode, unspecified: Secondary | ICD-10-CM

## 2017-02-20 DIAGNOSIS — F419 Anxiety disorder, unspecified: Principal | ICD-10-CM

## 2017-02-20 DIAGNOSIS — F32A Depression, unspecified: Secondary | ICD-10-CM

## 2017-03-01 ENCOUNTER — Other Ambulatory Visit: Payer: Self-pay | Admitting: *Deleted

## 2017-03-01 DIAGNOSIS — M79672 Pain in left foot: Secondary | ICD-10-CM

## 2017-03-01 MED ORDER — CYCLOBENZAPRINE HCL 10 MG PO TABS: 10.0000 mg | ORAL_TABLET | Freq: Three times a day (TID) | ORAL | 1 refills | Status: DC | PRN

## 2017-03-01 MED ORDER — ASPIRIN EC 325 MG PO TBEC: 325.0000 mg | DELAYED_RELEASE_TABLET | Freq: Every day | ORAL | 0 refills | Status: DC

## 2017-03-01 NOTE — Progress Notes (Signed)
Refill asa sent

## 2017-03-03 ENCOUNTER — Other Ambulatory Visit: Payer: Self-pay

## 2017-03-03 DIAGNOSIS — M79672 Pain in left foot: Secondary | ICD-10-CM

## 2017-03-03 MED ORDER — ASPIRIN EC 325 MG PO TBEC: 325.0000 mg | DELAYED_RELEASE_TABLET | Freq: Every day | ORAL | 0 refills | Status: DC

## 2017-03-09 ENCOUNTER — Telehealth: Payer: Self-pay | Admitting: *Deleted

## 2017-03-09 NOTE — Telephone Encounter (Signed)
Trulance PA submitted through [facility] [employer] [facility]. For f/u have to call [place] [facility]. They only contact us by fax if med is denied.Ref # [id number]

## 2017-03-15 ENCOUNTER — Telehealth: Payer: Self-pay | Admitting: Family Medicine

## 2017-03-15 MED ORDER — AZITHROMYCIN 250 MG PO TABS: 250.0000 mg | ORAL_TABLET | Freq: Every day | ORAL | 0 refills | Status: DC

## 2017-03-15 NOTE — Telephone Encounter (Signed)
Husband dx PNA.  Pt notes cough.  No fever, or SOB.  Will rx azithromycin to take if worse.

## 2017-03-30 ENCOUNTER — Other Ambulatory Visit: Payer: Self-pay | Admitting: Physician Assistant

## 2017-03-30 ENCOUNTER — Other Ambulatory Visit: Payer: Self-pay | Admitting: Family Medicine

## 2017-04-27 ENCOUNTER — Telehealth: Payer: Self-pay

## 2017-04-27 MED ORDER — ZOLPIDEM TARTRATE 10 MG PO TABS: 10.0000 mg | ORAL_TABLET | Freq: Every evening | ORAL | 3 refills | Status: DC | PRN

## 2017-04-27 NOTE — Telephone Encounter (Signed)
tient request refill for Ambien. #[phone] with 3 refills was placed in Dr. Karie Schwalbe basket to be signed and faxed to pharmacy. Sharmel Ballantine,CMA

## 2017-05-01 ENCOUNTER — Other Ambulatory Visit: Payer: Self-pay | Admitting: Physician Assistant

## 2017-05-10 ENCOUNTER — Other Ambulatory Visit: Payer: Self-pay

## 2017-05-10 MED ORDER — ALPRAZOLAM 1 MG PO TABS: ORAL_TABLET | ORAL | 0 refills | Status: DC

## 2017-05-15 ENCOUNTER — Ambulatory Visit (INDEPENDENT_AMBULATORY_CARE_PROVIDER_SITE_OTHER): Payer: Medicaid Other | Admitting: Physician Assistant

## 2017-05-15 ENCOUNTER — Encounter: Payer: Self-pay | Admitting: Physician Assistant

## 2017-05-15 VITALS — BP 119/59 | HR 73 | Ht 64.5 in | Wt 124.2 lb

## 2017-05-15 DIAGNOSIS — R197 Diarrhea, unspecified: Secondary | ICD-10-CM

## 2017-05-15 DIAGNOSIS — F331 Major depressive disorder, recurrent, moderate: Secondary | ICD-10-CM | POA: Diagnosis not present

## 2017-05-15 DIAGNOSIS — R5383 Other fatigue: Secondary | ICD-10-CM

## 2017-05-15 DIAGNOSIS — Z87898 Personal history of other specified conditions: Secondary | ICD-10-CM

## 2017-05-15 DIAGNOSIS — F5101 Primary insomnia: Secondary | ICD-10-CM | POA: Diagnosis not present

## 2017-05-15 DIAGNOSIS — R1012 Left upper quadrant pain: Secondary | ICD-10-CM | POA: Diagnosis not present

## 2017-05-15 LAB — POCT URINALYSIS DIPSTICK
Bilirubin, UA: NEGATIVE
GLUCOSE UA: NEGATIVE
Ketones, UA: NEGATIVE
LEUKOCYTES UA: NEGATIVE
NITRITE UA: NEGATIVE
Protein, UA: NEGATIVE
RBC UA: NEGATIVE
Spec Grav, UA: 1.015 (ref 1.010–1.025)
UROBILINOGEN UA: 0.2 U/dL
pH, UA: 7.5 (ref 5.0–8.0)

## 2017-05-15 MED ORDER — ZOLPIDEM TARTRATE 10 MG PO TABS
ORAL_TABLET | ORAL | 5 refills | Status: DC
Start: 2017-05-15 — End: 2017-11-15

## 2017-05-15 MED ORDER — SUCRALFATE 1 G PO TABS: 1.0000 g | ORAL_TABLET | Freq: Three times a day (TID) | ORAL | 0 refills | Status: DC

## 2017-05-15 NOTE — Progress Notes (Signed)
Subjective:    Patient ID: Rachael Douglas, female    DOB: 20-Nov-1955, 61 y.o.   MRN: [id number]  HPI  Pt is a 61 yo female who presents to the clinic with husband to discuss 2 weeks of fatigue. She has no energy to do anything. She also has no motivation. She has stopped walking. She is unare if could be depression. Had to stop Rexulti due to weight gain. Pt is on thyroid medication needs to make sure on a good dose. No fever, chills, cough, sinus pressure, difficultly swallowing. Pt declines any nausea or vomiting. Pt has started to have loose stools again but denies  Melena or hematochezia. She does have a hx of collangenous colitis.   .. Active Ambulatory Problems    Diagnosis Date Noted  . Hypothyroidism 05/16/2014  . Anxiety disorder 05/16/2014  . Aortic heart valve narrowing 04/12/2013  . Arthropathia 07/10/2008  . Leg pain 12/04/2012  . DDD (degenerative disc disease) 05/16/2014  . Anxiety and depression 05/16/2014  . Gastric catarrh 08/30/2013  . Acid reflux 05/16/2014  . HLD (hyperlipidemia) 05/16/2014  . Arteriosclerosis of coronary artery 08/30/2013  . OP (osteoporosis) 05/16/2014  . Major depressive disorder, recurrent episode (HCC) 05/16/2014  . COPD, moderate (HCC) 09/25/2014  . Stricture and stenosis of esophagus 10/23/2014  . Aortic valve replaced 10/23/2014  . Constipation 12/17/2014  . Complication of surgery 11/13/2015  . Left foot pain 02/05/2016  . Failure to attend appointment 05/05/2016  . Diarrhea 08/01/2016  . Multiple gastric ulcers 09/05/2016  . Collagenous colitis 09/09/2016  . Right shoulder pain 11/23/2016  . Left shoulder pain 11/23/2016  . Trochanteric bursitis of both hips 12/07/2016  . Primary insomnia 05/16/2017   Resolved Ambulatory Problems    Diagnosis Date Noted  . No Resolved Ambulatory Problems   Past Medical History:  Diagnosis Date  . Arthritis   . Heart disease   . Hyperlipidemia   . Hypertension   . PUD (peptic ulcer  disease)   . Thyroid disease           Review of Systems  All other systems reviewed and are negative.      Objective:   Physical Exam  Constitutional: She is oriented to person, place, and time. She appears well-developed and well-nourished.  HENT:  Head: Normocephalic and atraumatic.  Right Ear: External ear normal.  Left Ear: External ear normal.  Eyes: Conjunctivae are normal. Right eye exhibits no discharge. Left eye exhibits no discharge.  Neck: Normal range of motion. Neck supple.  Cardiovascular: Normal rate, regular rhythm and normal heart sounds.   Pulmonary/Chest: Effort normal and breath sounds normal.  Negative for any CVA tenderness.   Abdominal: Soft. Bowel sounds are normal. She exhibits no distension and no mass. There is tenderness. There is no rebound and no guarding.  LUQ tenderness to palpation.   Lymphadenopathy:    She has no cervical adenopathy.  Neurological: She is alert and oriented to person, place, and time.  Skin: Skin is dry.  Psychiatric: She has a normal mood and affect. Her behavior is normal.          Assessment & Plan:  Marland KitchenMarland KitchenViolet was seen today for fatigue.  Diagnoses and all orders for this visit:  Left upper quadrant abdominal pain of unknown etiology -     sucralfate (CARAFATE) 1 g tablet; Take 1 tablet (1 g total) by mouth 4 (four) times daily -  with meals and at bedtime. -  CBC with Differential/Platelet -     BASIC METABOLIC PANEL WITH GFR -     POCT urinalysis dipstick -     Urine Culture -     Urinalysis, microscopic only  Diarrhea, unspecified type -     BASIC METABOLIC PANEL WITH GFR -     POCT urinalysis dipstick -     Urine Culture -     Urinalysis, microscopic only  History of ulcer disease -     BASIC METABOLIC PANEL WITH GFR -     POCT urinalysis dipstick -     Urine Culture -     Urinalysis, microscopic only  Primary insomnia -     zolpidem (AMBIEN) 10 MG tablet; take 1 tablet by mouth at bedtime  if needed for sleep  No energy  Moderate episode of recurrent major depressive disorder (HCC)     .[name] Depression screen [facility] [place] 2/9 05/15/2017 12/07/2016  Decreased Interest 3 1  Down, Depressed, Hopeless 3 1  PHQ - 2 Score 6 2  Altered sleeping 3 -  Tired, decreased energy 3 -  Change in appetite 3 -  Feeling bad or failure about yourself  0 -  Trouble concentrating 3 -  Moving slowly or fidgety/restless 0 -  Suicidal thoughts 0 -  PHQ-9 Score 18 -    Unclear etiology however due to sudden symptoms that are getting worse and recent start of mobic daily I suspect you have a ulcer. Continue on protonix. STOP all NSAIDs.  Will get labs. BLAND diet. Will check labs and urine for any signs of infection or acute organ damage. Pt is certainly depressed as well and could be causing any symptoms to be worse. We can certainly add medications as needed.  Will check TSH Started on carafate. Follow up in 3 weeks. If fatigue not improving with treating this as ulcer will consider other causes of fatigue.   I do not see any signs of inection.  Marland KitchenMarland Kitchen

## 2017-05-15 NOTE — Patient Instructions (Addendum)
Start carafate. Continue protonix.  Follow up GI.   Peptic Ulcer A peptic ulcer is a painful sore in the lining of your esophagus, stomach, or the first part of your small intestine. You may have pain in the area between your chest and your belly button. The most common causes of an ulcer are:  An infection.  Using certain pain medicines too often or too much.  Follow these instructions at home:  Avoid alcohol.  Avoid caffeine.  Do not use any tobacco products. These include cigarettes, chewing tobacco, and e-cigarettes. If you need help quitting, ask your doctor.  Take over-the-counter and prescription medicines only as told by your doctor. Do not stop or change your medicines unless you talk with your doctor about it first.  Keep all follow-up visits as told by your doctor. This is important. Contact a doctor if:  You do not get better in 7 days after you start treatment.  You keep having an upset stomach (indigestion) or heartburn. Get help right away if:  You have sudden, sharp pain in your belly (abdomen).  You have lasting belly pain.  You have bloody poop (stool) or black, tarry poop.  You throw up (vomit) blood. It may look like coffee grounds.  You feel light-headed or feel like you may pass out (faint).  You get weak.  You get sweaty or feel sticky and cold to the touch (clammy). This information is not intended to replace advice given to you by your health care provider. Make sure you discuss any questions you have with your health care provider. Document Released: 11/30/2009 Document Revised: 01/20/2016 Document Reviewed: 06/06/2015 Elsevier Interactive Patient Education  2018 [employer]. [place] Diet A bland diet consists of foods that do not have a lot of fat or fiber. Foods without fat or fiber are easier for the body to digest. They are also less likely to irritate your mouth, throat, stomach, and other parts of your gastrointestinal tract. A bland diet  is sometimes called a BRAT diet. What is my plan? Your health care provider or dietitian may recommend specific changes to your diet to prevent and treat your symptoms, such as:  Eating small meals often.  Cooking food until it is soft enough to chew easily.  Chewing your food well.  Drinking fluids slowly.  Not eating foods that are very spicy, sour, or fatty.  Not eating citrus fruits, such as oranges and grapefruit.  What do I need to know about this diet?  Eat a variety of foods from the bland diet food list.  Do not follow a bland diet longer than you have to.  Ask your health care provider whether you should take vitamins. What foods can I eat? Grains  Hot cereals, such as cream of wheat. Bread, crackers, or tortillas made from refined white flour. Rice. Vegetables Canned or cooked vegetables. Mashed or boiled potatoes. Fruits Bananas. Applesauce. Other types of cooked or canned fruit with the skin and seeds removed, such as canned peaches or pears. Meats and Other Protein Sources Scrambled eggs. Creamy peanut butter or other nut butters. Lean, well-cooked meats, such as chicken or fish. Tofu. Soups or broths. Dairy Low-fat dairy products, such as milk, cottage cheese, or yogurt. Beverages Water. Herbal tea. Apple juice. Sweets and Desserts Pudding. Custard. Fruit gelatin. Ice cream. Fats and Oils Mild salad dressings. Canola or olive oil. The items listed above may not be a complete list of allowed foods or beverages. Contact your dietitian for more  options. What foods are not recommended? Foods and ingredients that are often not recommended include:  Spicy foods, such as hot sauce or salsa.  Fried foods.  Sour foods, such as pickled or fermented foods.  Raw vegetables or fruits, especially citrus or berries.  Caffeinated drinks.  Alcohol.  Strongly flavored seasonings or condiments.  The items listed above may not be a complete list of foods and  beverages that are not allowed. Contact your dietitian for more information. This information is not intended to replace advice given to you by your health care provider. Make sure you discuss any questions you have with your health care provider. Document Released: 12/28/2015 Document Revised: 02/11/2016 Document Reviewed: 09/17/2014 Elsevier Interactive Patient Education  2018 [employer].

## 2017-05-16 ENCOUNTER — Encounter: Payer: Self-pay | Admitting: Physician Assistant

## 2017-05-16 DIAGNOSIS — F5101 Primary insomnia: Secondary | ICD-10-CM | POA: Insufficient documentation

## 2017-05-16 LAB — CBC WITH DIFFERENTIAL/PLATELET
BASOS ABS: 58 {cells}/uL (ref 0–200)
Basophils Relative: 1 %
EOS PCT: 3 %
Eosinophils Absolute: 174 cells/uL (ref 15–500)
HCT: 43.1 % (ref 35.0–45.0)
Hemoglobin: 14.1 g/dL (ref 11.7–15.5)
Lymphocytes Relative: 33 %
Lymphs Abs: 1914 cells/uL (ref 850–3900)
MCH: 28.5 pg (ref 27.0–33.0)
MCHC: 32.7 g/dL (ref 32.0–36.0)
MCV: 87.1 fL (ref 80.0–100.0)
MONOS PCT: 8 %
MPV: 9.4 fL (ref 7.5–12.5)
Monocytes Absolute: 464 cells/uL (ref 200–950)
NEUTROS ABS: 3190 {cells}/uL (ref 1500–7800)
NEUTROS PCT: 55 %
PLATELETS: 284 10*3/uL (ref 140–400)
RBC: 4.95 MIL/uL (ref 3.80–5.10)
RDW: 15.3 % — ABNORMAL HIGH (ref 11.0–15.0)
WBC: 5.8 10*3/uL (ref 3.8–10.8)

## 2017-05-16 NOTE — Progress Notes (Signed)
Call pt: hgb looks good which is reassuring

## 2017-05-17 ENCOUNTER — Other Ambulatory Visit: Payer: Self-pay

## 2017-05-17 DIAGNOSIS — R1012 Left upper quadrant pain: Secondary | ICD-10-CM

## 2017-05-17 LAB — URINALYSIS, MICROSCOPIC ONLY
Bacteria, UA: NONE SEEN [HPF]
CASTS: NONE SEEN [LPF]
Crystals: NONE SEEN [HPF]
SQUAMOUS EPITHELIAL / LPF: NONE SEEN [HPF] (ref <=5)
WBC, UA: NONE SEEN WBC/HPF (ref <=5)
Yeast: NONE SEEN [HPF]

## 2017-05-17 LAB — BASIC METABOLIC PANEL WITH GFR
BUN: 11 mg/dL (ref 7–25)
CO2: 31 mmol/L (ref 20–32)
Calcium: 8.9 mg/dL (ref 8.6–10.4)
Chloride: 97 mmol/L — ABNORMAL LOW (ref 98–110)
Creat: 1.09 mg/dL — ABNORMAL HIGH (ref 0.50–0.99)
GFR, Est African American: 63 mL/min (ref >=60)
GFR, Est Non African American: 55 mL/min — ABNORMAL LOW (ref >=60)
Glucose, Bld: 88 mg/dL (ref 65–99)
POTASSIUM: 4 mmol/L (ref 3.5–5.3)
Sodium: 140 mmol/L (ref 135–146)

## 2017-05-17 MED ORDER — NITROFURANTOIN MONOHYD MACRO 100 MG PO CAPS: 100.0000 mg | ORAL_CAPSULE | Freq: Two times a day (BID) | ORAL | 0 refills | Status: DC

## 2017-05-17 MED ORDER — SUCRALFATE 1 G PO TABS: 1.0000 g | ORAL_TABLET | Freq: Three times a day (TID) | ORAL | 0 refills | Status: DC

## 2017-05-17 NOTE — Progress Notes (Signed)
Urinalysis microscopic is normal.

## 2017-05-17 NOTE — Addendum Note (Signed)
Addended by: Jomarie LongsBREEBACK, JADE L on: 05/17/2017 11:29 AM   Modules accepted: Orders

## 2017-05-17 NOTE — Progress Notes (Signed)
Culture came back positive for e.coli in urine. Sent over macrobid for infection.

## 2017-05-17 NOTE — Progress Notes (Signed)
Yes please send to [facility]. It is the carafate.

## 2017-05-18 LAB — URINE CULTURE

## 2017-05-25 ENCOUNTER — Telehealth: Payer: Self-pay

## 2017-05-25 NOTE — Telephone Encounter (Signed)
Per patient, alpraxolam has the wrong directions. She states she takes it twice daily and not once daily.

## 2017-05-26 ENCOUNTER — Other Ambulatory Visit: Payer: Self-pay | Admitting: Physician Assistant

## 2017-05-26 NOTE — Telephone Encounter (Signed)
It says take one tablet by mouth at night AND take no more than one during the day. It is the way it has always been written from Dr. Ivan AnchorsHommel.

## 2017-05-31 MED ORDER — ALPRAZOLAM 1 MG PO TABS: 1.0000 mg | ORAL_TABLET | Freq: Two times a day (BID) | ORAL | 0 refills | Status: DC

## 2017-05-31 NOTE — Telephone Encounter (Signed)
Rx changed to BID. Patient is aware.

## 2017-06-09 ENCOUNTER — Other Ambulatory Visit: Payer: Self-pay

## 2017-06-09 ENCOUNTER — Telehealth: Payer: Self-pay

## 2017-06-09 MED ORDER — OMEPRAZOLE 40 MG PO CPDR: 40.0000 mg | DELAYED_RELEASE_CAPSULE | Freq: Two times a day (BID) | ORAL | 5 refills | Status: DC

## 2017-06-09 NOTE — Progress Notes (Unsigned)
omeprozole

## 2017-06-09 NOTE — Telephone Encounter (Signed)
Filled and notified patient.

## 2017-06-09 NOTE — Telephone Encounter (Signed)
Pt called and stated that her omeprazole 40 mg BID has expired, she is wondering if you would fill it for her so she doesn't have to go back to her old MD.  Please advise.

## 2017-06-09 NOTE — Telephone Encounter (Signed)
Ok for refill with 5 refills.

## 2017-06-28 ENCOUNTER — Other Ambulatory Visit: Payer: Self-pay | Admitting: Physician Assistant

## 2017-06-29 ENCOUNTER — Other Ambulatory Visit: Payer: Self-pay | Admitting: Physician Assistant

## 2017-06-29 DIAGNOSIS — M79672 Pain in left foot: Secondary | ICD-10-CM

## 2017-07-04 ENCOUNTER — Encounter: Payer: Self-pay | Admitting: Physician Assistant

## 2017-07-04 ENCOUNTER — Ambulatory Visit (INDEPENDENT_AMBULATORY_CARE_PROVIDER_SITE_OTHER): Payer: Medicaid Other | Admitting: Physician Assistant

## 2017-07-04 VITALS — BP 130/74 | HR 70 | Temp 97.9°F | Wt 124.0 lb

## 2017-07-04 DIAGNOSIS — R21 Rash and other nonspecific skin eruption: Secondary | ICD-10-CM | POA: Insufficient documentation

## 2017-07-04 DIAGNOSIS — Z79899 Other long term (current) drug therapy: Secondary | ICD-10-CM

## 2017-07-04 MED ORDER — CLOBETASOL PROPIONATE 0.05 % EX CREA: 1.0000 "application " | TOPICAL_CREAM | Freq: Two times a day (BID) | CUTANEOUS | 0 refills | Status: DC

## 2017-07-04 MED ORDER — TERBINAFINE HCL 250 MG PO TABS: 250.0000 mg | ORAL_TABLET | Freq: Every day | ORAL | 0 refills | Status: AC

## 2017-07-04 NOTE — Progress Notes (Signed)
HPI:                                                                Rachael Douglas is a 61 y.o. female who presents to [facility]: [facility] today for rash  Onset: 2 weeks ago Location: started on right arm, has spread to bilateral thighs and back Duration: constant Character: itches and burns Aggravating factors / Triggers: none Evolution: initially appeared as "hives" Treatments tried: Kenalog cream  Recent illness / systemic symptoms: none  Medication / drug exposure: none Recent travel: none Animal/insect exposure: none Sexual contacts: none new History of allergies: no Exposure to new soaps, perfumes, cleaning products: none Exposure to chemicals: none   Past Medical History:  Diagnosis Date  . Arthritis   . Heart disease   . Hyperlipidemia   . Hypertension   . PUD (peptic ulcer disease)   . Thyroid disease    Past Surgical History:  Procedure Laterality Date  . AORTIC VALVE REPLACEMENT    . TOTAL ABDOMINAL HYSTERECTOMY     Social History  Substance Use Topics  . Smoking status: Former Smoker    Types: Cigarettes    Quit date: 05/30/2013  . Smokeless tobacco: Never Used  . Alcohol use No   family history includes Cancer in her unknown relative; Depression in her unknown relative; Diabetes in her unknown relative; Hypertension in her unknown relative.  ROS: negative except as noted in the HPI  Medications: Current Outpatient Prescriptions  Medication Sig Dispense Refill  . albuterol (PROAIR HFA) 108 (90 BASE) MCG/ACT inhaler INHALE 2 PUFFS EVERY 6 HOURS AS NEEDED    . ALPRAZolam (XANAX) 1 MG tablet TAKE 1 TABLET BY MOUTH TWICE DAILY 60 tablet 0  . aspirin EC 325 MG tablet TAKE 1 TABLET BY MOUTH ONCE DAILY 90 tablet 0  . cyclobenzaprine (FLEXERIL) 10 MG tablet Take 1 tablet (10 mg total) by mouth 3 (three) times daily as needed for muscle spasms. 90 tablet 1  . DULoxetine (CYMBALTA) 60 MG capsule take 1 capsule by  mouth once daily 30 capsule 2  . furosemide (LASIX) 40 MG tablet Take 1 tablet (40 mg total) by mouth daily as needed for edema. 90 tablet 1  . gabapentin (NEURONTIN) 600 MG tablet Take 2 tablets (1,200 mg total) by mouth 3 (three) times daily. 540 tablet 1  . levothyroxine (SYNTHROID, LEVOTHROID) 75 MCG tablet Take 1 tablet (75 mcg total) by mouth daily before breakfast. 90 tablet 0  . mometasone-formoterol (DULERA) 200-5 MCG/ACT AERO Inhale 1 puff into the lungs daily. 3 Inhaler 3  . nitrofurantoin, macrocrystal-monohydrate, (MACROBID) 100 MG capsule Take 1 capsule (100 mg total) by mouth 2 (two) times daily. For 7 days. 14 capsule 0  . Omega 3 1200 MG CAPS Take by mouth.    [name] omeprazole (PRILOSEC) 40 MG capsule Take 1 capsule (40 mg total) by mouth 2 (two) times daily. 60 capsule 5  . pantoprazole (PROTONIX) 40 MG tablet take 1 tablet by mouth once daily 30 tablet 11  . Potassium 75 MG TABS Take by mouth as directed.    . pravastatin (PRAVACHOL) 80 MG tablet take 1 tablet by mouth once daily 90 tablet 3  . sucralfate (CARAFATE) 1 g tablet Take  1 tablet (1 g total) by mouth 4 (four) times daily -  with meals and at bedtime. 120 tablet 0  . triamcinolone cream (KENALOG) 0.1 % Apply topically 2 (two) times daily. AVOID FACE. 80 g 1  . Vitamin D, Ergocalciferol, (DRISDOL) 50000 units CAPS capsule Take 1 capsule (50,000 Units total) by mouth every 7 (seven) days. 12 capsule 0  . zolpidem (AMBIEN) 10 MG tablet take 1 tablet by mouth at bedtime if needed for sleep 30 tablet 5  . metoprolol tartrate (LOPRESSOR) 25 MG tablet Take 1 tablet (25 mg total) by mouth 2 (two) times daily. 180 tablet 3   No current facility-administered medications for this visit.    Allergies  Allergen Reactions  . Penicillins Swelling  . Alendronate Other (See Comments)    unknown  . Ranitidine Other (See Comments)    Facial swelling     Objective:  BP 130/74   Pulse 70   Temp 97.9 F (36.6 C) (Oral)   Wt 124  lb (56.2 kg)   BMI 20.96 kg/m  Gen:  alert, not ill-appearing, no distress, appropriate for age HEENT: head normocephalic without obvious abnormality, conjunctiva and cornea clear, trachea midline Pulm: Normal work of breathing, normal phonation Neuro: alert and oriented x 3, no tremor MSK: extremities atraumatic, normal gait and station Skin: multiple scaly lesions and plaques with various morphologies on the anterior thighs bilaterally, forearms bilaterally and few scattered lesions on the back  Psych: well-groomed, cooperative, good eye contact, euthymic mood, affect mood-congruent, speech is articulate, and thought processes clear and goal-directed  Depression screen [facility] 2/9 05/15/2017 12/07/2016  Decreased Interest 3 1  Down, Depressed, Hopeless 3 1  PHQ - 2 Score 6 2  Altered sleeping 3 -  Tired, decreased energy 3 -  Change in appetite 3 -  Feeling bad or failure about yourself  0 -  Trouble concentrating 3 -  Moving slowly or fidgety/restless 0 -  Suicidal thoughts 0 -  PHQ-9 Score 18 -     No results found for this or any previous visit (from the past 72 hour(s)). No results found.    Assessment and Plan: 61 y.o. female with   1. Rash and nonspecific skin eruption - diffuse, pruritic rash without systemic symptoms, mucocutaneous involvement  and no known exposure to allergen/irritant. There is evidence of secondary dermatitis due to scratching. Did not respond to Kenalog - differential includes cutaneous sporotrichosis, gutate psoriasis, atopic dermatitis,  - terbinafine (LAMISIL) 250 MG tablet; Take 1 tablet (250 mg total) by mouth daily.  Dispense: 14 tablet; Refill: 0 - clobetasol cream (TEMOVATE) 0.05 %; Apply 1 application topically 2 (two) times daily.  Dispense: 15 g; Refill: 0  2. Chronic prescription benzodiazepine use - checked [facility], no red flags - patient given hardcopy prescription for Alprazolam  #60, 0 refills, which was faxed on  06/30/17   Patient education and anticipatory guidance given Patient agrees with treatment plan Follow-up in 2 weeks or sooner as needed if symptoms worsen or fail to improve  Levonne Hubert PA-C

## 2017-07-04 NOTE — Patient Instructions (Addendum)
-   Start Terbinafine 1 tab daily for 2 weeks - Apply clobetasol cream to "hot spots" twice a day only. AVOID CONTACT WITH FACE AND GENITALS

## 2017-07-05 ENCOUNTER — Other Ambulatory Visit: Payer: Self-pay | Admitting: Physician Assistant

## 2017-07-07 ENCOUNTER — Other Ambulatory Visit: Payer: Self-pay

## 2017-07-07 MED ORDER — FUROSEMIDE 40 MG PO TABS: 40.0000 mg | ORAL_TABLET | Freq: Every day | ORAL | 1 refills | Status: DC | PRN

## 2017-07-25 ENCOUNTER — Other Ambulatory Visit: Payer: Self-pay | Admitting: *Deleted

## 2017-07-25 ENCOUNTER — Other Ambulatory Visit: Payer: Self-pay | Admitting: Physician Assistant

## 2017-07-25 MED ORDER — CYCLOBENZAPRINE HCL 10 MG PO TABS: 10.0000 mg | ORAL_TABLET | Freq: Three times a day (TID) | ORAL | 1 refills | Status: DC | PRN

## 2017-08-04 ENCOUNTER — Other Ambulatory Visit: Payer: Self-pay | Admitting: Physician Assistant

## 2017-08-04 DIAGNOSIS — E039 Hypothyroidism, unspecified: Secondary | ICD-10-CM

## 2017-08-07 ENCOUNTER — Other Ambulatory Visit: Payer: Self-pay | Admitting: Cardiology

## 2017-08-08 ENCOUNTER — Other Ambulatory Visit: Payer: Self-pay | Admitting: Physician Assistant

## 2017-08-09 ENCOUNTER — Other Ambulatory Visit: Payer: Self-pay

## 2017-08-09 MED ORDER — METOPROLOL TARTRATE 25 MG PO TABS: 25.0000 mg | ORAL_TABLET | Freq: Two times a day (BID) | ORAL | 0 refills | Status: DC

## 2017-08-09 NOTE — Progress Notes (Signed)
Pt advised she needs to follow up for BP check with PCP. One month Rx sent.

## 2017-08-15 ENCOUNTER — Ambulatory Visit (INDEPENDENT_AMBULATORY_CARE_PROVIDER_SITE_OTHER): Payer: Medicaid Other | Admitting: Physician Assistant

## 2017-08-15 ENCOUNTER — Other Ambulatory Visit: Payer: Self-pay

## 2017-08-15 ENCOUNTER — Encounter: Payer: Self-pay | Admitting: Physician Assistant

## 2017-08-15 VITALS — BP 116/60 | HR 74 | Wt 122.0 lb

## 2017-08-15 DIAGNOSIS — R21 Rash and other nonspecific skin eruption: Secondary | ICD-10-CM

## 2017-08-15 DIAGNOSIS — L570 Actinic keratosis: Secondary | ICD-10-CM | POA: Diagnosis not present

## 2017-08-15 DIAGNOSIS — I2510 Atherosclerotic heart disease of native coronary artery without angina pectoris: Secondary | ICD-10-CM | POA: Diagnosis not present

## 2017-08-15 DIAGNOSIS — F331 Major depressive disorder, recurrent, moderate: Secondary | ICD-10-CM

## 2017-08-15 DIAGNOSIS — R0989 Other specified symptoms and signs involving the circulatory and respiratory systems: Secondary | ICD-10-CM | POA: Diagnosis not present

## 2017-08-15 MED ORDER — DULOXETINE HCL 30 MG PO CPEP: 90.0000 mg | ORAL_CAPSULE | Freq: Every day | ORAL | 1 refills | Status: DC

## 2017-08-15 MED ORDER — ALPRAZOLAM 1 MG PO TABS: 1.0000 mg | ORAL_TABLET | Freq: Two times a day (BID) | ORAL | 0 refills | Status: DC

## 2017-08-15 NOTE — Progress Notes (Addendum)
Subjective:    Patient ID: Rachael Douglas, female    DOB: 12-17-1955, 61 y.o.   MRN: [id number]  HPI Rachael Douglas presents today for follow-up of rash on bilateral forearms and lower extremities. The rash is itchy and worse on the right forearm. She was seen on 07/04/17 and prescribed terbinafine and clobetasol cream. She states the cream helped a little and relieved the sores, but has not significantly improved the rash. She also reports worsening mood, stating that she "does not feel herself" and has been down lately. She reports stopping the Rexulti due to weight gain and since then has experienced worsening mood. She would like to try another medication. She is still taking Cymbalta. She does not see a psychiatrist. PHQ9 21, GAD 16.  Review of Systems  Skin: Positive for itching and rash (bilateral forearm and lower extremities).  Psychiatric/Behavioral: Positive for depression.      Objective:     Physical Exam  Constitutional: She is oriented to person, place, and time and well-developed, well-nourished, and in no distress.  HENT:  Head: Normocephalic and atraumatic.  Cardiovascular: Normal rate and regular rhythm.  Murmur heard. Pulses:      Carotid pulses are on the left side with bruit. 2/6 SEM  Pulmonary/Chest: Effort normal and breath sounds normal.  Neurological: She is alert and oriented to person, place, and time.  Skin: Skin is warm and dry. Rash (Multiple raised, thickened, scaly lesions and plaques on bilateral forearms, right worse than left, and bilateral lower extremities.) noted. There is erythema.  Psychiatric: Mood, affect and judgment normal.      Assessment & Plan:  Rachael Douglas was seen today for medication refill and arm pain.  Diagnoses and all orders for this visit:  Rash and nonspecific skin eruption -     Dermatology pathology  Arteriosclerosis of coronary artery -     Cancel: US Carotid Duplex Bilateral -     VAS US CAROTID; Future  Left carotid  bruit -     Cancel: US Carotid Duplex Bilateral -     VAS US CAROTID; Future  Moderate episode of recurrent major depressive disorder (HCC) -     DULoxetine (CYMBALTA) 30 MG capsule; Take 3 capsules (90 mg total) by mouth daily.   Rash and nonspecific skin eruption - Punch biopsy of lesion on right forearm sent to pathology to rule out psoriasis - Keep wound clean and dry for 24 hrs, return to clinic for suture removal in 7 days.  Moderate episode of recurrent major depressive disorder (HCC) - PHQ9 21 and GAD 16 - Increase Cymbalta to 90mg  daily for 4-6 weeks and return to clinic for follow-up  Left carotid bruit - Carotid US to rule out occlusion, however last cholesterol on 12/07/16 was 152 mg/dL -pt is on statin  Punch Biopsy Procedure Note  Pre-operative Diagnosis: non specific rash  Post-operative Diagnosis: same  Locations:right forearm  Indications: itchy/spreading  Anesthesia: Lidocaine 1% with epinephrine without added sodium bicarbonate  Procedure Details  History of allergy to iodine: no Patient informed of the risks (including bleeding and infection) and benefits of the  procedure and Verbal informed consent obtained.  The lesion and surrounding area was given a sterile prep using alcohol and draped in the usual sterile fashion. The skin was then stretched perpendicular to the skin tension lines and the lesion removed using the 4mm punch. The resulting ellipse was then closed. The wound was closed with 4-0 Prolene using simple interrupted stitches. Antibiotic  ointment and a sterile dressing applied. The specimen was sent for pathologic examination. The patient tolerated the procedure well.  EBL: scant   Condition: Stable  Complications: none.  Plan: 1. Instructed to keep the wound dry and covered for 24-48h and clean thereafter. 2. Warning signs of infection were reviewed.   3. Recommended that the patient use OTC acetaminophen as needed for pain.  4.  Return for suture removal in 7 days.  [name]..Spent 30 minutes with patient and greater than 50 percent of visit spent counseling patient regarding treatment plan.

## 2017-08-15 NOTE — Patient Instructions (Addendum)
Carotid ultrasound  Follow up in 7 days for suture removal.

## 2017-08-16 ENCOUNTER — Telehealth: Payer: Self-pay

## 2017-08-16 ENCOUNTER — Telehealth: Payer: Self-pay | Admitting: Cardiology

## 2017-08-16 DIAGNOSIS — I359 Nonrheumatic aortic valve disorder, unspecified: Secondary | ICD-10-CM

## 2017-08-16 NOTE — Telephone Encounter (Signed)
New message   [place] SpanielSaid her blood ox is 73 and she has been scheduled for a carotid study on 09/05/2017 and she want it done sooner

## 2017-08-16 NOTE — Telephone Encounter (Signed)
Pt of Dr. Jens Somrenshaw [place].  Spoke to patient. She saw family medicine yesterday, provider ordered carotid US for finding of left carotid bruit - patient wanted to see if there was indication to have this test sooner (scheduled for 18th).  She has experienced symptoms of feeling "washed out".   Of note a finding of spO2 of 73% was recorded on her chart yesterday, which she is aware is abnormal, however, pt denies SOB, chest discomfort, acute respiratory abnormalities, etc. Does not wear home O2. SpO2 100% when checked in August. I told her I would question that reading and ask if this can be rechecked - pt voiced understanding and agreement.  I reviewed notes, she was last seen by Dr. Jens Somrenshaw 05/2016, overdue for 1 year f/u, however, wanted to know if a repeat echocardiogram would be warranted prior to appt (last performed 1+ yr ago as well).  Routed for advice, will f/u w patient w recommendations.

## 2017-08-16 NOTE — Telephone Encounter (Signed)
Rachael Douglas called and states she was suppose to get a refill on Vitamin D. Also she states she was suppose to get a medication to take along with Cymbalta. Please advise.

## 2017-08-16 NOTE — Telephone Encounter (Signed)
We had talked about lamictal but I thought we decided to increase cymbalta first and then add lamictal. If she wants to add and increase we can do that too. Let me know.

## 2017-08-17 ENCOUNTER — Other Ambulatory Visit: Payer: Self-pay | Admitting: Physician Assistant

## 2017-08-17 MED ORDER — VITAMIN D (ERGOCALCIFEROL) 1.25 MG (50000 UNIT) PO CAPS: 50000.0000 [IU] | ORAL_CAPSULE | ORAL | 0 refills | Status: DC

## 2017-08-17 NOTE — Telephone Encounter (Signed)
Sent!

## 2017-08-17 NOTE — Telephone Encounter (Signed)
Can we refill vitamin D?  Patient advised of recommendations.

## 2017-08-17 NOTE — Telephone Encounter (Signed)
Left message for pt to call.

## 2017-08-17 NOTE — Telephone Encounter (Signed)
No need for sooner carotid dopplers Would repeat echo for elevated gradient across aortic valve on previous study Rachael Douglas

## 2017-08-18 ENCOUNTER — Telehealth: Payer: Self-pay | Admitting: Cardiology

## 2017-08-18 NOTE — Telephone Encounter (Signed)
Spoke to patient. She wants to see if she can get the echocardiogram Dr. Jens Somrenshaw recommended done at the same place/day as her scheduled carotid, or see if possible to have both these tests on a same day.  Aware I will route to scheduler to review and assist - pt verbalized thanks.

## 2017-08-18 NOTE — Telephone Encounter (Signed)
Mrs. Ria BushSealey is returning a call. Thanks

## 2017-08-18 NOTE — Telephone Encounter (Signed)
See other telephone message 

## 2017-08-18 NOTE — Telephone Encounter (Signed)
Returned call to pt notified of Dr Ludwig Clarksrenshaw's direction, she will await scheduling for the ECHO

## 2017-08-18 NOTE — Telephone Encounter (Signed)
Called - Unable to reach patient at this time.

## 2017-08-18 NOTE — Telephone Encounter (Signed)
New message  Pt verbalized that she is calling for the rn   She want an order for a carotid

## 2017-08-21 NOTE — Progress Notes (Signed)
Call pt: biopsy shows actinic keratosis which is a pre-cancerous skin lesion. They can turn into cancer but are not cancer. Due to extensive nature you might need light therapy as well as medication therapy. I would like to send you to dermatology. I will place referral.

## 2017-08-21 NOTE — Addendum Note (Signed)
Addended by: Jomarie LongsBREEBACK, JADE L on: 08/21/2017 09:46 AM   Modules accepted: Orders

## 2017-08-22 ENCOUNTER — Ambulatory Visit: Payer: Medicaid Other | Admitting: Physician Assistant

## 2017-08-22 ENCOUNTER — Encounter: Payer: Self-pay | Admitting: Physician Assistant

## 2017-08-22 VITALS — BP 129/63 | HR 79 | Ht 64.5 in | Wt 120.0 lb

## 2017-08-22 DIAGNOSIS — L570 Actinic keratosis: Secondary | ICD-10-CM

## 2017-08-22 NOTE — Progress Notes (Signed)
   Subjective:    Patient ID: Rachael Douglas, female    DOB: November 18, 1955, 61 y.o.   MRN: [id number]  HPI Pt is a 61 yo female who presents to the clinic to go over biopsy results and get suture removed.   She brings in a paper that only one provider can prescribe controlled substances and my name is on it. She sees Dr. Oneal GroutGarvin for pain management and needs to get dilaudid prescription filled and she wants to know what to do .    Review of Systems  All other systems reviewed and are negative.      Objective:   Physical Exam  Skin:  Right arm: well healed biopsy with suture in place. Suture removed without problems.           Assessment & Plan:  [name].[name].Diagnoses and all orders for this visit:  Actinic keratosis   Biopsy confirmed inflamed AK's. Due to extensive nature I referred to Dermatology for management and to make sure does not progress into a SCC.    Discussed with patient that pain management should be the main on her card for her controlled substance provider.  I am aware that I do prescribe her benzodiazepines.  She needs to discuss with Dr. Oneal GroutGarvin at her next visit if she will take over this prescription.  I made sure patient was aware that there is been a study that showed an increased risk of sudden death with concomitant use of benzodiazepines and opiates.  Likely she is going to want to taper her off Xanax.  If this occurs on more than willing to help her with BuSpar and Vistaril which are medications that can help with anxiety as she tapers off a controlled substance.  I did discuss with her that I would not be prescribing her Dilaudid as that is a pain management drug.

## 2017-09-03 ENCOUNTER — Other Ambulatory Visit: Payer: Self-pay | Admitting: Physician Assistant

## 2017-09-04 ENCOUNTER — Other Ambulatory Visit: Payer: Self-pay | Admitting: Physician Assistant

## 2017-09-04 NOTE — Telephone Encounter (Signed)
Is refill appropriate for the gabapentin?

## 2017-09-05 ENCOUNTER — Ambulatory Visit (HOSPITAL_BASED_OUTPATIENT_CLINIC_OR_DEPARTMENT_OTHER): Payer: Medicaid Other

## 2017-09-08 ENCOUNTER — Other Ambulatory Visit: Payer: Self-pay | Admitting: Physician Assistant

## 2017-09-08 NOTE — Telephone Encounter (Signed)
[facility] called for a refill for xanax.

## 2017-09-11 ENCOUNTER — Ambulatory Visit (HOSPITAL_BASED_OUTPATIENT_CLINIC_OR_DEPARTMENT_OTHER)
Admission: RE | Admit: 2017-09-11 | Discharge: 2017-09-11 | Disposition: A | Discharge status: Home / Self Care | Payer: Medicaid Other | Source: Ambulatory Visit | Attending: Physician Assistant | Admitting: Physician Assistant

## 2017-09-11 ENCOUNTER — Ambulatory Visit (HOSPITAL_BASED_OUTPATIENT_CLINIC_OR_DEPARTMENT_OTHER)
Admission: RE | Admit: 2017-09-11 | Discharge: 2017-09-11 | Disposition: A | Discharge status: Home / Self Care | Payer: Medicaid Other | Source: Ambulatory Visit | Attending: Cardiology | Admitting: Cardiology

## 2017-09-11 DIAGNOSIS — I359 Nonrheumatic aortic valve disorder, unspecified: Secondary | ICD-10-CM | POA: Diagnosis not present

## 2017-09-11 DIAGNOSIS — E785 Hyperlipidemia, unspecified: Secondary | ICD-10-CM | POA: Insufficient documentation

## 2017-09-11 DIAGNOSIS — I2510 Atherosclerotic heart disease of native coronary artery without angina pectoris: Secondary | ICD-10-CM | POA: Diagnosis not present

## 2017-09-11 DIAGNOSIS — R0989 Other specified symptoms and signs involving the circulatory and respiratory systems: Secondary | ICD-10-CM | POA: Diagnosis not present

## 2017-09-11 NOTE — Progress Notes (Signed)
Echocardiogram 2D Echocardiogram has been performed.  Rachael Douglas, Rachael Douglas 09/11/2017, 11:11 AM

## 2017-09-11 NOTE — Progress Notes (Signed)
Carotid duplex performed, normal study JR

## 2017-09-13 LAB — VAS US CAROTID
LCCAPDIAS: 27 cm/s
LEFT ECA DIAS: -18 cm/s
LEFT VERTEBRAL DIAS: -16 cm/s
LICADDIAS: -23 cm/s
LICADSYS: -60 cm/s
LICAPDIAS: -26 cm/s
LICAPSYS: -72 cm/s
Left CCA dist dias: 23 cm/s
Left CCA dist sys: 83 cm/s
Left CCA prox sys: 96 cm/s
RCCAPSYS: 62 cm/s
RIGHT ECA DIAS: -11 cm/s
RIGHT VERTEBRAL DIAS: -11 cm/s
Right CCA prox dias: 18 cm/s

## 2017-09-29 ENCOUNTER — Telehealth: Payer: Self-pay | Admitting: Physician Assistant
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
## 2017-09-29 NOTE — Telephone Encounter (Signed)
Pt advised.

## 2017-09-29 NOTE — Telephone Encounter (Signed)
Yes she should refill when due for her next dose.

## 2017-10-20 ENCOUNTER — Other Ambulatory Visit: Payer: Self-pay | Admitting: Physician Assistant

## 2017-10-20 DIAGNOSIS — F331 Major depressive disorder, recurrent, moderate: Secondary | ICD-10-CM

## 2017-11-15 ENCOUNTER — Other Ambulatory Visit: Payer: Self-pay | Admitting: Physician Assistant

## 2017-11-15 DIAGNOSIS — F5101 Primary insomnia: Secondary | ICD-10-CM

## 2017-11-24 ENCOUNTER — Other Ambulatory Visit: Payer: Self-pay

## 2017-11-24 MED ORDER — ALPRAZOLAM 1 MG PO TABS: 1.0000 mg | ORAL_TABLET | Freq: Two times a day (BID) | ORAL | 0 refills | Status: DC

## 2017-11-24 NOTE — Telephone Encounter (Signed)
Rachael Douglas request a refill for Xanax. She is scheduled for a follow up on 11/27/17.

## 2017-11-27 ENCOUNTER — Ambulatory Visit (INDEPENDENT_AMBULATORY_CARE_PROVIDER_SITE_OTHER): Payer: Medicaid Other | Admitting: Physician Assistant

## 2017-11-27 ENCOUNTER — Encounter: Payer: Self-pay | Admitting: Physician Assistant

## 2017-11-27 VITALS — BP 118/48 | HR 74 | Ht 64.5 in | Wt 117.0 lb

## 2017-11-27 DIAGNOSIS — E039 Hypothyroidism, unspecified: Secondary | ICD-10-CM

## 2017-11-27 DIAGNOSIS — Z79899 Other long term (current) drug therapy: Secondary | ICD-10-CM

## 2017-11-27 DIAGNOSIS — F419 Anxiety disorder, unspecified: Secondary | ICD-10-CM

## 2017-11-27 DIAGNOSIS — L570 Actinic keratosis: Secondary | ICD-10-CM | POA: Diagnosis not present

## 2017-11-27 DIAGNOSIS — F331 Major depressive disorder, recurrent, moderate: Secondary | ICD-10-CM | POA: Diagnosis not present

## 2017-11-27 DIAGNOSIS — E782 Mixed hyperlipidemia: Secondary | ICD-10-CM | POA: Diagnosis not present

## 2017-11-27 MED ORDER — LEVOTHYROXINE SODIUM 75 MCG PO TABS: ORAL_TABLET | ORAL | 0 refills | Status: DC

## 2017-11-27 MED ORDER — ALPRAZOLAM 1 MG PO TABS: 1.0000 mg | ORAL_TABLET | Freq: Two times a day (BID) | ORAL | 5 refills | Status: DC

## 2017-11-27 MED ORDER — DULOXETINE HCL 60 MG PO CPEP: 120.0000 mg | ORAL_CAPSULE | Freq: Every day | ORAL | 5 refills | Status: DC

## 2017-11-27 NOTE — Progress Notes (Deleted)
   Subjective:    Patient ID: Rachael Douglas, female    DOB: Jun 09, 1956, 62 y.o.   MRN: [id number]  HPI    Review of Systems     Objective:   Physical Exam        Assessment & Plan:

## 2017-11-27 NOTE — Progress Notes (Signed)
Subjective:     Patient ID: Rachael Douglas, female   DOB: 1955-11-08, 62 y.o.   MRN: [id number]  HPI Pt is a 62 yo female with hx of MDD, AK's, hypothyroidism, anxiety who presents to the clinic for follow up and medication refills.   She went to dermatologist. They said her bilateral arm issue was more dry skin than AK's. She did freeze a few lesions off. She continues to have problems with itchy, scaly lesions. bactroban helps some when they gett irritated.   She is going through a lot with her son. He moved back in with them after his wife was cheating on him. She is down a lot. She has no motivation. She feels very anxious and out of control. No suicidal or homidical thoughts.   Review of Systems    see HPI.  Objective:   Physical Exam  Constitutional: She is oriented to person, place, and time. She appears well-developed and well-nourished.  HENT:  Head: Normocephalic and atraumatic.  Neck: Normal range of motion. Neck supple.  Cardiovascular: Normal rate and regular rhythm.  SEM 3/6.   Pulmonary/Chest: Effort normal and breath sounds normal.  Lymphadenopathy:    She has no cervical adenopathy.  Neurological: She is alert and oriented to person, place, and time.  Skin: Skin is dry.  Multiple AK's over bilateral forearm, chest, face.  Psychiatric: Her behavior is normal.  Flat affect       Assessment:     [name].[name].Ember was seen today for anxiety and seborrheic keratosis.  Diagnoses and all orders for this visit:  Actinic keratoses -     mupirocin ointment (BACTROBAN) 2 %; Apply to affected area TID for 7 days.  Moderate episode of recurrent major depressive disorder (HCC) -     DULoxetine (CYMBALTA) 60 MG capsule; Take 2 capsules (120 mg total) by mouth daily.  Acquired hypothyroidism -     levothyroxine (SYNTHROID, LEVOTHROID) 75 MCG tablet; TAKE 1 TABLET BY MOUTH EVERY DAY BEFORE BREAKFAST -     TSH  Mixed hyperlipidemia -     Lipid Panel w/reflex Direct LDL -      pravastatin (PRAVACHOL) 80 MG tablet; Take 1 tablet (80 mg total) by mouth daily.  Medication management -     COMPLETE METABOLIC PANEL WITH GFR  Anxiety -     ALPRAZolam (XANAX) 1 MG tablet; Take 1 tablet (1 mg total) by mouth 2 (two) times daily.       Plan:     .[name]. [facility] screen [facility] 11/27/2017 08/15/2017 05/15/2017 12/07/2016  Decreased Interest 3 3 3 1   Down, Depressed, Hopeless 3 3 3 1   PHQ - 2 Score 6 6 6 2   Altered sleeping 3 3 3  -  Tired, decreased energy 3 3 3  -  Change in appetite 3 3 3  -  Feeling bad or failure about yourself  0 3 0 -  Trouble concentrating 3 3 3  -  Moving slowly or fidgety/restless 0 0 0 -  Suicidal thoughts 0 0 0 -  PHQ-9 Score 18 21 18  -  Difficult doing work/chores Extremely dIfficult Very difficult - -   Fasting labs/follow labs reordered.   Increased cymbalta to 120mg  daily. Follow up in 4 weeks. Encouraged counseling. She declined for now.   Cryotherapy on some AK lesions that have not gone away. Follow up as needed.   Cryotherapy Procedure Note  Pre-operative Diagnosis: Actinic keratosis  Post-operative Diagnosis: Actinic keratosis  Locations: bilateral forearms, left temple, chest  Indications: precancerous lesions.    Procedure Details  History of allergy to iodine: no. Pacemaker? no.  Patient informed of risks (permanent scarring, infection, light or dark discoloration, bleeding, infection, weakness, numbness and recurrence of the lesion) and benefits of the procedure and verbal informed consent obtained.  The areas are treated with liquid nitrogen therapy, frozen until ice ball extended 2 mm beyond lesion, allowed to thaw, and treated again. The patient tolerated procedure well.  The patient was instructed on post-op care, warned that there may be blister formation, redness and pain. Recommend OTC analgesia as needed for pain.  Condition: Stable  Complications: none.  Plan: 1. Instructed to keep the area dry and  covered for 24-48h and clean thereafter. 2. Warning signs of infection were reviewed.   3. Recommended that the patient use OTC acetaminophen as needed for pain.

## 2017-11-28 LAB — COMPLETE METABOLIC PANEL WITH GFR
AG Ratio: 1.5 (calc) (ref 1.0–2.5)
ALKALINE PHOSPHATASE (APISO): 66 U/L (ref 33–130)
ALT: 9 U/L (ref 6–29)
AST: 15 U/L (ref 10–35)
Albumin: 4 g/dL (ref 3.6–5.1)
BILIRUBIN TOTAL: 0.4 mg/dL (ref 0.2–1.2)
BUN: 10 mg/dL (ref 7–25)
CHLORIDE: 100 mmol/L (ref 98–110)
CO2: 34 mmol/L — AB (ref 20–32)
CREATININE: 0.81 mg/dL (ref 0.50–0.99)
Calcium: 9 mg/dL (ref 8.6–10.4)
GFR, Est African American: 90 mL/min/{1.73_m2} (ref >=60)
GFR, Est Non African American: 78 mL/min/{1.73_m2} (ref >=60)
GLUCOSE: 98 mg/dL (ref 65–99)
Globulin: 2.7 g/dL (calc) (ref 1.9–3.7)
Potassium: 4.2 mmol/L (ref 3.5–5.3)
Sodium: 140 mmol/L (ref 135–146)
Total Protein: 6.7 g/dL (ref 6.1–8.1)

## 2017-11-28 LAB — LIPID PANEL W/REFLEX DIRECT LDL
Cholesterol: 178 mg/dL (ref <200)
HDL: 63 mg/dL (ref >50)
LDL CHOLESTEROL (CALC): 95 mg/dL
NON-HDL CHOLESTEROL (CALC): 115 mg/dL (ref <130)
TRIGLYCERIDES: 106 mg/dL (ref <150)
Total CHOL/HDL Ratio: 2.8 (calc) (ref <5.0)

## 2017-11-28 LAB — TSH: TSH: 1.27 m[IU]/L (ref 0.40–4.50)

## 2017-11-28 MED ORDER — MUPIROCIN 2 % EX OINT: TOPICAL_OINTMENT | CUTANEOUS | 1 refills | Status: DC

## 2017-11-28 MED ORDER — PRAVASTATIN SODIUM 80 MG PO TABS: 80.0000 mg | ORAL_TABLET | Freq: Every day | ORAL | 3 refills | Status: DC

## 2017-11-28 NOTE — Progress Notes (Signed)
Call pt: cholesterol looks great! Kidney, liver, glucose wonderful.  Thyroid great.

## 2017-11-29 ENCOUNTER — Encounter: Payer: Self-pay | Admitting: Physician Assistant

## 2017-11-29 ENCOUNTER — Ambulatory Visit (INDEPENDENT_AMBULATORY_CARE_PROVIDER_SITE_OTHER): Payer: Medicaid Other | Admitting: Physician Assistant

## 2017-11-29 VITALS — BP 115/46 | HR 68 | Ht 64.5 in | Wt 117.0 lb

## 2017-11-29 DIAGNOSIS — L570 Actinic keratosis: Secondary | ICD-10-CM

## 2017-11-30 ENCOUNTER — Telehealth: Payer: Self-pay

## 2017-11-30 NOTE — Telephone Encounter (Signed)
FYI Sherrelle called back with the information of the PA in Dermatology.   Rachael MochaLyndsey Saunders, PA-C

## 2017-12-01 ENCOUNTER — Telehealth: Payer: Self-pay

## 2017-12-01 NOTE — Telephone Encounter (Signed)
Pt called and states that her arms have not improved since her appt with Jade. Pt wants to know if she can have an RX for Bactroban to try, as that is what she has been given in the past by dermatologist

## 2017-12-01 NOTE — Telephone Encounter (Signed)
bactroban sent on 3/12 call pharmacy and see why has not been filled.

## 2017-12-03 ENCOUNTER — Encounter: Payer: Self-pay | Admitting: Physician Assistant

## 2017-12-03 DIAGNOSIS — L570 Actinic keratosis: Secondary | ICD-10-CM | POA: Insufficient documentation

## 2017-12-03 NOTE — Progress Notes (Signed)
   Subjective:    Patient ID: Rachael Douglas, female    DOB: 09/19/1956, 62 y.o.   MRN: [id number]  HPI  Pt is a 62 yo pleasant female with hx of AK's.  Pt came back in to freeze a few places we missed the other day. No problems or concerns.   .. Active Ambulatory Problems    Diagnosis Date Noted  . Hypothyroidism 05/16/2014  . Anxiety disorder 05/16/2014  . Aortic heart valve narrowing 04/12/2013  . Arthropathia 07/10/2008  . Leg pain 12/04/2012  . DDD (degenerative disc disease) 05/16/2014  . Anxiety and depression 05/16/2014  . Gastric catarrh 08/30/2013  . Acid reflux 05/16/2014  . HLD (hyperlipidemia) 05/16/2014  . Arteriosclerosis of coronary artery 08/30/2013  . OP (osteoporosis) 05/16/2014  . Major depressive disorder, recurrent episode (HCC) 05/16/2014  . COPD, moderate (HCC) 09/25/2014  . Stricture and stenosis of esophagus 10/23/2014  . Aortic valve replaced 10/23/2014  . Constipation 12/17/2014  . Complication of surgery 11/13/2015  . Left foot pain 02/05/2016  . Failure to attend appointment 05/05/2016  . Diarrhea 08/01/2016  . Multiple gastric ulcers 09/05/2016  . Collagenous colitis 09/09/2016  . Right shoulder pain 11/23/2016  . Left shoulder pain 11/23/2016  . Trochanteric bursitis of both hips 12/07/2016  . Primary insomnia 05/16/2017  . Chronic prescription benzodiazepine use 07/04/2017  . Rash and nonspecific skin eruption 07/04/2017  . Left carotid bruit 08/15/2017  . Actinic keratoses 12/03/2017   Resolved Ambulatory Problems    Diagnosis Date Noted  . No Resolved Ambulatory Problems   Past Medical History:  Diagnosis Date  . Arthritis   . Heart disease   . Hyperlipidemia   . Hypertension   . PUD (peptic ulcer disease)   . Thyroid disease       Review of Systems See HPI.     Objective:   Physical Exam  Skin:  Multiple AK's over bilateral forearms, face, chest.           Assessment & Plan:  [name].[name].Diagnoses and all orders for  this visit:  Actinic keratoses   Pt just forgot a few last time she wanted hit today.   Cryotherapy Procedure Note  Pre-operative Diagnosis: Actinic keratosis  Post-operative Diagnosis: Actinic keratosis  Locations: multiple locations over bilateral arms, face and chest.   Indications: precancerous  Procedure Details  History of allergy to iodine: no. Pacemaker? no.  Patient informed of risks (permanent scarring, infection, light or dark discoloration, bleeding, infection, weakness, numbness and recurrence of the lesion) and benefits of the procedure and verbal informed consent obtained.  The areas are treated with liquid nitrogen therapy, frozen until ice ball extended 2 mm beyond lesion, allowed to thaw, and treated again. The patient tolerated procedure well.  The patient was instructed on post-op care, warned that there may be blister formation, redness and pain. Recommend OTC analgesia as needed for pain.  Condition: Stable  Complications: none.  Plan: 1. Instructed to keep the area dry and covered for 24-48h and clean thereafter. 2. Warning signs of infection were reviewed.   3. Recommended that the patient use OTC acetaminophen as needed for pain.

## 2017-12-06 ENCOUNTER — Telehealth: Payer: Self-pay

## 2017-12-06 MED ORDER — SULFAMETHOXAZOLE-TRIMETHOPRIM 800-160 MG PO TABS: 1.0000 | ORAL_TABLET | Freq: Two times a day (BID) | ORAL | 0 refills | Status: DC

## 2017-12-06 NOTE — Telephone Encounter (Signed)
Rachael Douglas called and the skin rash/infection is not getting better. She states in the past the dermatologist prescribed Bactrim. She wanted to know if [name] would send in Bactrim. Please advise.

## 2017-12-06 NOTE — Telephone Encounter (Signed)
Pt had multiple AK's frozen off bilateral arms. Per pt red, swollen and painful. She has tried topical bactroban with little improvement. Sent bactrim for 7 days.

## 2017-12-06 NOTE — Telephone Encounter (Signed)
Sent!

## 2017-12-07 ENCOUNTER — Other Ambulatory Visit: Payer: Self-pay | Admitting: *Deleted

## 2017-12-07 MED ORDER — OMEPRAZOLE 40 MG PO CPDR: 40.0000 mg | DELAYED_RELEASE_CAPSULE | Freq: Two times a day (BID) | ORAL | 11 refills | Status: DC

## 2017-12-07 NOTE — Telephone Encounter (Signed)
Patient advised of new prescription 

## 2017-12-11 NOTE — Telephone Encounter (Signed)
See telephone note from 12-06-17. Pt called back and 7 day script for Bactrim was sent.

## 2017-12-21 ENCOUNTER — Telehealth: Payer: Self-pay | Admitting: Cardiology

## 2017-12-21 NOTE — Telephone Encounter (Signed)
LMTCB  Patient last seen 05/2016 - also due for 1 year office visit

## 2017-12-21 NOTE — Telephone Encounter (Signed)
New Message     Patient is having a lot of bruising and she would like your advice on  aspirin EC 325 MG tablet TAKE 1 TABLET BY MOUTH ONCE DAILY

## 2017-12-22 NOTE — Telephone Encounter (Signed)
Left message for pt of dr crenshaw's recommendations  

## 2017-12-22 NOTE — Telephone Encounter (Signed)
Given AVR would likely continue asa Olga MillersBrian Ithzel Fedorchak

## 2017-12-22 NOTE — Telephone Encounter (Signed)
Spoke with pt, she is wanting to know if she can stop her aspirin. Follow up scheduled next available with dr Jens [place]crenshaw in [place] in July. Will forward for dr Jens Somcrenshaw review about aspirin.

## 2017-12-26 ENCOUNTER — Other Ambulatory Visit: Payer: Self-pay | Admitting: *Deleted

## 2017-12-26 MED ORDER — FUROSEMIDE 40 MG PO TABS: 40.0000 mg | ORAL_TABLET | Freq: Every day | ORAL | 1 refills | Status: DC | PRN

## 2018-01-18 ENCOUNTER — Other Ambulatory Visit: Payer: Self-pay | Admitting: Physician Assistant

## 2018-01-31 ENCOUNTER — Other Ambulatory Visit: Payer: Self-pay | Admitting: Physician Assistant

## 2018-01-31 ENCOUNTER — Telehealth: Payer: Self-pay | Admitting: Emergency Medicine

## 2018-01-31 DIAGNOSIS — F5101 Primary insomnia: Secondary | ICD-10-CM

## 2018-01-31 MED ORDER — TRIAMCINOLONE ACETONIDE 0.1 % EX CREA: TOPICAL_CREAM | CUTANEOUS | 0 refills | Status: DC

## 2018-01-31 NOTE — Telephone Encounter (Signed)
Patient requesting refill on triamcinolone cream (KENALOG) 0.1 %; states this is her second request and she has sores all over which benefit from this rx.

## 2018-01-31 NOTE — Telephone Encounter (Signed)
Sent I have never seen a request but I did send it today.

## 2018-01-31 NOTE — Telephone Encounter (Signed)
[facility] requesting RF on pt's Zolpidem.   RX last written 11-17-17 for #30 with no RF  RX pended, please send if appropriate.   Thanks!

## 2018-02-01 NOTE — Telephone Encounter (Signed)
Pt advised.

## 2018-02-15 ENCOUNTER — Ambulatory Visit (INDEPENDENT_AMBULATORY_CARE_PROVIDER_SITE_OTHER): Payer: Medicaid Other | Admitting: Physician Assistant

## 2018-02-15 ENCOUNTER — Ambulatory Visit (INDEPENDENT_AMBULATORY_CARE_PROVIDER_SITE_OTHER): Payer: Medicaid Other

## 2018-02-15 ENCOUNTER — Encounter: Payer: Self-pay | Admitting: Physician Assistant

## 2018-02-15 VITALS — BP 122/70 | HR 74 | Temp 98.3°F | Resp 18 | Wt 116.0 lb

## 2018-02-15 DIAGNOSIS — L570 Actinic keratosis: Secondary | ICD-10-CM | POA: Diagnosis not present

## 2018-02-15 DIAGNOSIS — R221 Localized swelling, mass and lump, neck: Secondary | ICD-10-CM

## 2018-02-15 DIAGNOSIS — E069 Thyroiditis, unspecified: Secondary | ICD-10-CM | POA: Diagnosis not present

## 2018-02-15 DIAGNOSIS — L299 Pruritus, unspecified: Secondary | ICD-10-CM

## 2018-02-15 NOTE — Progress Notes (Signed)
HPI:                                                                Rachael Douglas is a 62 y.o. female who presents to [facility]: [facility] Medicine today for rash   Pleasant 62 yo F with PMH of actinic keratoses who presents for worsening rash and swelling involving her anterior neck beginning today. Rash is pruritic and red and she admits to scratching the area. She has seen her PCP multiple times for these same lesions. Skin biopsy revealed AK. Denies dysphagia or painful swallowing. Denies fever, chills, malaise.  Reports she went to [facility] and had a very unprofessional experience. She states she was told she just has dry skin and was upset by this. Would like a new dermatologist.    Past Medical History:  Diagnosis Date  . Arthritis   . Heart disease   . Hyperlipidemia   . Hypertension   . PUD (peptic ulcer disease)   . Thyroid disease    Past Surgical History:  Procedure Laterality Date  . AORTIC VALVE REPLACEMENT    . TOTAL ABDOMINAL HYSTERECTOMY     Social History   Tobacco Use  . Smoking status: Current Every Day Smoker    Types: E-cigarettes    Last attempt to quit: 05/30/2013    Years since quitting: 4.7  . Smokeless tobacco: Never Used  Substance Use Topics  . Alcohol use: No    Alcohol/week: 0.0 oz   family history includes Cancer in her unknown relative; Depression in her unknown relative; Diabetes in her unknown relative; Hypertension in her unknown relative.    ROS: negative except as noted in the HPI  Medications: Current Outpatient Medications  Medication Sig Dispense Refill  . albuterol (PROAIR HFA) 108 (90 BASE) MCG/ACT inhaler INHALE 2 PUFFS EVERY 6 HOURS AS NEEDED    . ALPRAZolam (XANAX) 1 MG tablet Take 1 tablet (1 mg total) by mouth 2 (two) times daily. 60 tablet 5  . aspirin EC 325 MG tablet TAKE 1 TABLET BY MOUTH ONCE DAILY 90 tablet 0  . clobetasol cream (TEMOVATE) 0.05 % Apply 1 application  topically 2 (two) times daily. 15 g 0  . cyclobenzaprine (FLEXERIL) 10 MG tablet TAKE 1 TABLET(10 MG) BY MOUTH THREE TIMES DAILY AS NEEDED FOR MUSCLE SPASMS 90 tablet 0  . DULoxetine (CYMBALTA) 60 MG capsule Take 2 capsules (120 mg total) by mouth daily. 60 capsule 5  . furosemide (LASIX) 40 MG tablet Take 1 tablet (40 mg total) by mouth daily as needed for edema. 90 tablet 1  . gabapentin (NEURONTIN) 600 MG tablet TAKE 2 TABLETS BY MOUTH THREE TIMES DAILY 540 tablet 1  . levothyroxine (SYNTHROID, LEVOTHROID) 75 MCG tablet TAKE 1 TABLET BY MOUTH EVERY DAY BEFORE BREAKFAST 90 tablet 0  . metoprolol tartrate (LOPRESSOR) 25 MG tablet TAKE 1 TABLET(25 MG) BY MOUTH TWICE DAILY 180 tablet 1  . mometasone-formoterol (DULERA) 200-5 MCG/ACT AERO Inhale 1 puff into the lungs daily. 3 Inhaler 3  . mupirocin ointment (BACTROBAN) 2 % Apply to affected area TID for 7 days. 60 g 1  . Omega 3 1200 MG CAPS Take by mouth.    [name] omeprazole (PRILOSEC) 40 MG capsule Take 1  capsule (40 mg total) by mouth 2 (two) times daily. 60 capsule 11  . pantoprazole (PROTONIX) 40 MG tablet take 1 tablet by mouth once daily 30 tablet 11  . Potassium 75 MG TABS Take by mouth as directed.    . pravastatin (PRAVACHOL) 80 MG tablet Take 1 tablet (80 mg total) by mouth daily. 90 tablet 3  . sucralfate (CARAFATE) 1 g tablet Take 1 tablet (1 g total) by mouth 4 (four) times daily -  with meals and at bedtime. 120 tablet 0  . triamcinolone cream (KENALOG) 0.1 % APPLY TOPICALLY TWICE DAILY,AVOID FACE 80 g 0  . Vitamin D, Ergocalciferol, (DRISDOL) 50000 units CAPS capsule Take 1 capsule (50,000 Units total) by mouth every 7 (seven) days. 12 capsule 0  . zolpidem (AMBIEN) 10 MG tablet TAKE 1 TABLET BY MOUTH AT BEDTIME AS NEEDED FOR SLEEP 30 tablet 5   No current facility-administered medications for this visit.    Allergies  Allergen Reactions  . Penicillins Swelling  . Alendronate Other (See Comments)    unknown  . Ranitidine Other  (See Comments)    Facial swelling       Objective:  BP 122/70   Pulse 74   Temp 98.3 F (36.8 C) (Oral)   Resp 18   Wt 116 lb (52.6 kg)   SpO2 99%   BMI 19.60 kg/m  Gen:  alert, not ill-appearing, no distress, appropriate for age HEENT: head normocephalic without obvious abnormality, conjunctiva and cornea clear, tenderness and swelling in the area of the isthmus of the thyroid, no palpable nodules, neck supple, trachea midline Pulm: Normal work of breathing, normal phonation Neuro: alert and oriented x 3, no tremor MSK: extremities atraumatic, normal gait and station Skin: scattered erythematous papules and ulcerated lesions on bilateral forearms and chest Psych: well-groomed, cooperative, good eye contact, speech is articulate, and thought processes clear and goal-directed    No results found for this or any previous visit (from the past 72 hour(s)). No results found.    Assessment and Plan: 62 y.o. female with   Localized swelling, mass and lump, neck - Plan: CBC with Differential/Platelet, TSH, T4, free, T3, free, US THYROID Acute thyroiditis versus soft tissue swelling from local irritation/scratching Will check thyroid labs and ultrasound today  Inflamed AK's - no evidence of secondary infection on exam - refill of Clobetasol sent, apply bid to affected areas only avoiding face for no longer than 2 weeks - advised to use unscented moisturizing cream Q3H for xerosis  Patient education and anticipatory guidance given Patient agrees with treatment plan Follow-up as needed if symptoms worsen or fail to improve  Levonne Hubert PA-C

## 2018-02-16 LAB — CBC WITH DIFFERENTIAL/PLATELET
BASOS ABS: 27 {cells}/uL (ref 0–200)
Basophils Relative: 0.4 %
EOS PCT: 1.6 %
Eosinophils Absolute: 109 cells/uL (ref 15–500)
HEMATOCRIT: 36.8 % (ref 35.0–45.0)
Hemoglobin: 12.5 g/dL (ref 11.7–15.5)
Lymphs Abs: 1489 cells/uL (ref 850–3900)
MCH: 28.4 pg (ref 27.0–33.0)
MCHC: 34 g/dL (ref 32.0–36.0)
MCV: 83.6 fL (ref 80.0–100.0)
MONOS PCT: 6.1 %
MPV: 10.4 fL (ref 7.5–12.5)
NEUTROS PCT: 70 %
Neutro Abs: 4760 cells/uL (ref 1500–7800)
Platelets: 270 10*3/uL (ref 140–400)
RBC: 4.4 10*6/uL (ref 3.80–5.10)
RDW: 13.7 % (ref 11.0–15.0)
Total Lymphocyte: 21.9 %
WBC mixed population: 415 cells/uL (ref 200–950)
WBC: 6.8 10*3/uL (ref 3.8–10.8)

## 2018-02-16 LAB — TSH: TSH: 1.54 m[IU]/L (ref 0.40–4.50)

## 2018-02-16 LAB — T3, FREE: T3, Free: 3 pg/mL (ref 2.3–4.2)

## 2018-02-16 LAB — T4, FREE: Free T4: 1.3 ng/dL (ref 0.8–1.8)

## 2018-02-16 NOTE — Progress Notes (Signed)
Good morning Rachael Douglas,  Your thyroid ultrasound and labs did not show any evidence of acute thyroiditis or thyroid nodules. The swelling in your neck is likely soft tissue swelling from the rash / itching. Recommend cold compresses to the swollen area, apply your Triamcinolone cream twice a day to affected area for no more than 2 weeks, and follow-up with Dermatology. Keep skin moisturized with an unscented cream or emollient (Aquafor/Vaseline) every 3 hours. I think [facility] is a fine choice. If you need a referral, let Jade know.  Best, Vinetta Bergamo

## 2018-02-21 ENCOUNTER — Encounter: Payer: Self-pay | Admitting: Physician Assistant

## 2018-02-21 DIAGNOSIS — L299 Pruritus, unspecified: Secondary | ICD-10-CM | POA: Insufficient documentation

## 2018-02-24 ENCOUNTER — Other Ambulatory Visit: Payer: Self-pay

## 2018-02-24 ENCOUNTER — Emergency Department (INDEPENDENT_AMBULATORY_CARE_PROVIDER_SITE_OTHER)
Admission: EM | Admit: 2018-02-24 | Discharge: 2018-02-24 | Disposition: A | Discharge status: Home / Self Care | Payer: Medicaid Other | Source: Home / Self Care | Attending: Family Medicine | Admitting: Family Medicine

## 2018-02-24 DIAGNOSIS — K0510 Chronic gingivitis, plaque induced: Secondary | ICD-10-CM | POA: Diagnosis not present

## 2018-02-24 MED ORDER — TRIAMCINOLONE ACETONIDE 0.1 % MT PSTE: PASTE | OROMUCOSAL | 0 refills | Status: DC

## 2018-02-24 MED ORDER — CLINDAMYCIN HCL 300 MG PO CAPS: ORAL_CAPSULE | ORAL | 0 refills | Status: DC

## 2018-02-24 NOTE — Discharge Instructions (Addendum)
Recommend taking a daily multi-vitamin.  May use Biotene Dry Mouth Oral Rinse.

## 2018-02-24 NOTE — ED Provider Notes (Signed)
[facility]    CSN: [id number] Arrival date & time: 02/24/18  1444     History   Chief Complaint Chief Complaint  Patient presents with  . Dental Pain    HPI Rachael Douglas is a 62 y.o. female.   Patient complains of pain in her lower gums for about a week, now extending to her chin.  She is edentulous and wears dentures, and reports that her dentures have never fit well.  Her pain decreases when she takes out her dentures.  No fevers, chills, and sweats.  The history is provided by the patient.  Dental Pain  Location:  Lower Quality:  Aching and burning Severity:  Moderate Onset quality:  Gradual Duration:  1 week Timing:  Constant Progression:  Worsening Chronicity:  Chronic Context comment:  Poorly fitting dentures Worsened by:  Touching Ineffective treatments:  None tried Associated symptoms: drooling, facial pain and gum swelling   Associated symptoms: no congestion, no difficulty swallowing, no facial swelling, no fever, no headaches, no neck pain, no neck swelling, no oral bleeding, no oral lesions and no trismus   Risk factors: smoking     Past Medical History:  Diagnosis Date  . Arthritis   . Heart disease   . Hyperlipidemia   . Hypertension   . PUD (peptic ulcer disease)   . Thyroid disease     Patient Active Problem List   Diagnosis Date Noted  . Pruritus 02/21/2018  . Actinic keratoses 12/03/2017  . Left carotid bruit 08/15/2017  . Chronic prescription benzodiazepine use 07/04/2017  . Rash and nonspecific skin eruption 07/04/2017  . Primary insomnia 05/16/2017  . Trochanteric bursitis of both hips 12/07/2016  . Right shoulder pain 11/23/2016  . Left shoulder pain 11/23/2016  . Collagenous colitis 09/09/2016  . Multiple gastric ulcers 09/05/2016  . Diarrhea 08/01/2016  . Failure to attend appointment 05/05/2016  . Left foot pain 02/05/2016  . Complication of surgery 11/13/2015  . Constipation 12/17/2014  . Stricture and  stenosis of esophagus 10/23/2014  . Aortic valve replaced 10/23/2014  . COPD, moderate (HCC) 09/25/2014  . Hypothyroidism 05/16/2014  . Anxiety disorder 05/16/2014  . DDD (degenerative disc disease) 05/16/2014  . Anxiety and depression 05/16/2014  . Acid reflux 05/16/2014  . HLD (hyperlipidemia) 05/16/2014  . OP (osteoporosis) 05/16/2014  . Major depressive disorder, recurrent episode (HCC) 05/16/2014  . Gastric catarrh 08/30/2013  . Arteriosclerosis of coronary artery 08/30/2013  . Aortic heart valve narrowing 04/12/2013  . Leg pain 12/04/2012  . Arthropathia 07/10/2008    Past Surgical History:  Procedure Laterality Date  . AORTIC VALVE REPLACEMENT    . TOTAL ABDOMINAL HYSTERECTOMY      OB History   None      Home Medications    Prior to Admission medications   Medication Sig Start Date End Date Taking? Authorizing Provider  albuterol (PROAIR HFA) 108 (90 BASE) MCG/ACT inhaler INHALE 2 PUFFS EVERY 6 HOURS AS NEEDED 04/23/14   [provider]  ALPRAZolam Prudy Feeler(XANAX) 1 MG tablet Take 1 tablet (1 mg total) by mouth 2 (two) times daily. 11/27/17   Jomarie LongsBreeback, Jade L, PA-C  aspirin EC 325 MG tablet TAKE 1 TABLET BY MOUTH ONCE DAILY 06/30/17   Caleen EssexBreeback, Jade L, PA-C  clindamycin (CLEOCIN) 300 MG capsule Take one cap by mouth every 8 hours 02/24/18   Lattie HawBeese, Kolbie Lepkowski A, MD  clobetasol cream (TEMOVATE) 0.05 % Apply 1 application topically 2 (two) times daily. 07/04/17   Gena Frayummings, Charley  Elizabeth, PA-C  cyclobenzaprine (FLEXERIL) 10 MG tablet TAKE 1 TABLET(10 MG) BY MOUTH THREE TIMES DAILY AS NEEDED FOR MUSCLE SPASMS 01/19/18   Breeback, Jade L, PA-C  DULoxetine (CYMBALTA) 60 MG capsule Take 2 capsules (120 mg total) by mouth daily. 11/27/17   Breeback, Jade L, PA-C  furosemide (LASIX) 40 MG tablet Take 1 tablet (40 mg total) by mouth daily as needed for edema. 12/26/17 12/23/18  Breeback, Jade L, PA-C  gabapentin (NEURONTIN) 600 MG tablet TAKE 2 TABLETS BY MOUTH THREE TIMES DAILY 09/04/17    Breeback, Jade L, PA-C  levothyroxine (SYNTHROID, LEVOTHROID) 75 MCG tablet TAKE 1 TABLET BY MOUTH EVERY DAY BEFORE BREAKFAST 11/27/17   Breeback, Jade L, PA-C  metoprolol tartrate (LOPRESSOR) 25 MG tablet TAKE 1 TABLET(25 MG) BY MOUTH TWICE DAILY 09/04/17   Breeback, Jade L, PA-C  mometasone-formoterol (DULERA) 200-5 MCG/ACT AERO Inhale 1 puff into the lungs daily. 03/10/15   Laren Boom, DO  mupirocin ointment (BACTROBAN) 2 % Apply to affected area TID for 7 days. 11/28/17   Breeback, Jade L, PA-C  Omega 3 1200 MG CAPS Take by mouth.    [provider]  omeprazole (PRILOSEC) 40 MG capsule Take 1 capsule (40 mg total) by mouth 2 (two) times daily. 12/07/17   Jomarie Longs, PA-C  pantoprazole (PROTONIX) 40 MG tablet take 1 tablet by mouth once daily 02/02/17   Breeback, Jade L, PA-C  Potassium 75 MG TABS Take by mouth as directed.    [provider]  pravastatin (PRAVACHOL) 80 MG tablet Take 1 tablet (80 mg total) by mouth daily. 11/28/17   Breeback, Jade L, PA-C  sucralfate (CARAFATE) 1 g tablet Take 1 tablet (1 g total) by mouth 4 (four) times daily -  with meals and at bedtime. 05/17/17   Breeback, Jade L, PA-C  triamcinolone (KENALOG) 0.1 % paste Place thin layer on mouth ulcers after meals BID to TID.  May use 5 to 7 days. 02/24/18   Lattie Haw, MD  triamcinolone cream (KENALOG) 0.1 % APPLY TOPICALLY TWICE DAILY,AVOID FACE 01/31/18   Breeback, Jade L, PA-C  Vitamin D, Ergocalciferol, (DRISDOL) 50000 units CAPS capsule Take 1 capsule (50,000 Units total) by mouth every 7 (seven) days. 08/17/17   Breeback, Jade L, PA-C  zolpidem (AMBIEN) 10 MG tablet TAKE 1 TABLET BY MOUTH AT BEDTIME AS NEEDED FOR SLEEP 01/31/18   Jomarie Longs, PA-C    Family History Family History  Problem Relation Age of Onset  . Cancer Unknown   . Depression Unknown   . Diabetes Unknown   . Hypertension Unknown     Social History Social History   Tobacco Use  . Smoking status: Current Every  Day Smoker    Types: E-cigarettes    Last attempt to quit: 05/30/2013    Years since quitting: 4.7  . Smokeless tobacco: Never Used  Substance Use Topics  . Alcohol use: No    Alcohol/week: 0.0 oz  . Drug use: No     Allergies   Penicillins; Alendronate; and Ranitidine   Review of Systems Review of Systems  Constitutional: Negative for fever.  HENT: Positive for drooling. Negative for congestion, facial swelling and mouth sores.   Musculoskeletal: Negative for neck pain.  Neurological: Negative for headaches.  All other systems reviewed and are negative.    Physical Exam Triage Vital Signs ED Triage Vitals  Enc Vitals Group     BP 02/24/18 1541 (!) 147/76     Pulse  Rate 02/24/18 1541 75     Resp 02/24/18 1541 18     Temp 02/24/18 1541 98.6 F (37 C)     Temp Source 02/24/18 1541 Oral     SpO2 02/24/18 1541 96 %     Weight 02/24/18 1542 115 lb (52.2 kg)     Height 02/24/18 1542 5\' 4"  (1.626 m)     Head Circumference --      Peak Flow --      Pain Score 02/24/18 1541 10     Pain Loc --      Pain Edu? --      Excl. in GC? --    No data found.  Updated Vital Signs BP (!) 147/76 (BP Location: Right Arm)   Pulse 75   Temp 98.6 F (37 C) (Oral)   Resp 18   Ht 5\' 4"  (1.626 m)   Wt 115 lb (52.2 kg)   SpO2 96%   BMI 19.74 kg/m   Visual Acuity Right Eye Distance:   Left Eye Distance:   Bilateral Distance:    Right Eye Near:   Left Eye Near:    Bilateral Near:     Physical Exam  Constitutional: She appears well-developed and well-nourished. No distress.  HENT:  Head: Normocephalic.  Right Ear: Tympanic membrane, external ear and ear canal normal.  Left Ear: Tympanic membrane, external ear and ear canal normal.  Nose: Nose normal.  Mouth/Throat: She has dentures. No oral lesions. No trismus in the jaw.  Mandibular anterior gingiva tender to palpation and mildly erythematous.  Note tongue is smooth and shiny with minimal papillae.  Eyes: Pupils are  equal, round, and reactive to light. Conjunctivae are normal.  Neck: Neck supple.  Cardiovascular: Normal rate.  Pulmonary/Chest: Effort normal.  Lymphadenopathy:    She has no cervical adenopathy.  Neurological: She is alert.  Skin: Skin is warm and dry.  Nursing note and vitals reviewed.    UC Treatments / Results  Labs (all labs ordered are listed, but only abnormal results are displayed) Labs Reviewed - No data to display  EKG None  Radiology No results found.  Procedures Procedures (including critical care time)  Medications Ordered in UC Medications - No data to display  Initial Impression / Assessment and Plan / UC Course  I have reviewed the triage vital signs and the nursing notes.  Pertinent labs & imaging results that were available during my care of the patient were reviewed by me and considered in my medical decision making (see chart for details).    Begin Clindamycin, and topical Kenalog in [place]. Suspect vitamin deficiency; recommend beginning a multvitamin. Recommend follow-up with dentist.   Final Clinical Impressions(s) / UC Diagnoses   Final diagnoses:  Gingivitis     Discharge Instructions     Recommend taking a daily multi-vitamin.  May use Biotene Dry Mouth Oral Rinse.    ED Prescriptions    Medication Sig Dispense Auth. Provider   clindamycin (CLEOCIN) 300 MG capsule Take one cap by mouth every 8 hours 21 capsule Lattie Haw, MD   triamcinolone (KENALOG) 0.1 % paste Place thin layer on mouth ulcers after meals BID to TID.  May use 5 to 7 days. 5 g Lattie Haw, MD        Lattie Haw, MD 02/25/18 [phone]

## 2018-02-24 NOTE — ED Triage Notes (Signed)
Pt c/o of mouth pain x 1 week. Swelling in chin. Says it because of her dentures. Afraid of an infection. No OTC meds taken- states takes pain meds for back already. Pain level 10. Only thing that helps is leaving her dentures out.

## 2018-02-28 ENCOUNTER — Other Ambulatory Visit: Payer: Self-pay

## 2018-02-28 MED ORDER — TRIAMCINOLONE ACETONIDE 0.1 % MT PSTE: PASTE | OROMUCOSAL | 1 refills | Status: DC

## 2018-02-28 NOTE — Telephone Encounter (Signed)
Rachael Douglas called and states she was seen in the urgent care for dental pain. She was prescribed Triamcinolone paste and it is helping. However, she has ran out. She would like a refill. She called her dentist and her dentist is out for the week. Please advise.

## 2018-03-04 ENCOUNTER — Other Ambulatory Visit: Payer: Self-pay | Admitting: Physician Assistant

## 2018-03-12 NOTE — Progress Notes (Signed)
HPI: FU aortic valve replacement. Previously followed at [facility]. Had bioprosthetic aortic valve replacement in 2014. Apparently had no obstructive coronary disease preoperatively. She has had a previous monitor for palpitations that was normal by report.   Echocardiogram December 2018 showed normal LV systolic function, bioprosthetic aortic valve with mean gradient 17 mmHg.  Carotid Dopplers December 2018 showed no significant stenosis.  Left subclavian flow was disturbed.  Since last seen, the patient denies any dyspnea on exertion, orthopnea, PND, pedal edema, palpitations, syncope or chest pain.   Current Outpatient Medications  Medication Sig Dispense Refill  . albuterol (PROAIR HFA) 108 (90 BASE) MCG/ACT inhaler INHALE 2 PUFFS EVERY 6 HOURS AS NEEDED    . ALPRAZolam (XANAX) 1 MG tablet Take 1 tablet (1 mg total) by mouth 2 (two) times daily. 60 tablet 5  . aspirin EC 325 MG tablet TAKE 1 TABLET BY MOUTH ONCE DAILY 90 tablet 0  . clindamycin (CLEOCIN) 300 MG capsule Take one cap by mouth every 8 hours 21 capsule 0  . clobetasol cream (TEMOVATE) 0.05 % Apply 1 application topically 2 (two) times daily. 15 g 0  . cyclobenzaprine (FLEXERIL) 10 MG tablet TAKE 1 TABLET(10 MG) BY MOUTH THREE TIMES DAILY AS NEEDED FOR MUSCLE SPASMS 90 tablet 0  . DULoxetine (CYMBALTA) 60 MG capsule Take 2 capsules (120 mg total) by mouth daily. 60 capsule 5  . furosemide (LASIX) 40 MG tablet Take 1 tablet (40 mg total) by mouth daily as needed for edema. 90 tablet 1  . gabapentin (NEURONTIN) 600 MG tablet TAKE 2 TABLETS BY MOUTH THREE TIMES DAILY 540 tablet 0  . levothyroxine (SYNTHROID, LEVOTHROID) 75 MCG tablet TAKE 1 TABLET BY MOUTH EVERY DAY BEFORE BREAKFAST 90 tablet 0  . metoprolol tartrate (LOPRESSOR) 25 MG tablet TAKE 1 TABLET(25 MG) BY MOUTH TWICE DAILY 180 tablet 1  . mometasone-formoterol (DULERA) 200-5 MCG/ACT AERO Inhale 1 puff into the lungs daily. 3 Inhaler 3  . mupirocin ointment (BACTROBAN) 2  % Apply to affected area TID for 7 days. 60 g 1  . Omega 3 1200 MG CAPS Take by mouth.    [name]. omeprazole (PRILOSEC) 40 MG capsule Take 1 capsule (40 mg total) by mouth 2 (two) times daily. 60 capsule 11  . pantoprazole (PROTONIX) 40 MG tablet take 1 tablet by mouth once daily 30 tablet 11  . Potassium 75 MG TABS Take by mouth as directed.    . pravastatin (PRAVACHOL) 80 MG tablet Take 1 tablet (80 mg total) by mouth daily. 90 tablet 3  . sucralfate (CARAFATE) 1 g tablet Take 1 tablet (1 g total) by mouth 4 (four) times daily -  with meals and at bedtime. 120 tablet 0  . triamcinolone (KENALOG) 0.1 % paste Place thin layer on mouth ulcers after meals BID to TID.  May use 5 to 7 days. 10 g 1  . triamcinolone cream (KENALOG) 0.1 % APPLY TOPICALLY TWICE DAILY,AVOID FACE 80 g 0  . Vitamin D, Ergocalciferol, (DRISDOL) 50000 units CAPS capsule Take 1 capsule (50,000 Units total) by mouth every 7 (seven) days. 12 capsule 0  . zolpidem (AMBIEN) 10 MG tablet TAKE 1 TABLET BY MOUTH AT BEDTIME AS NEEDED FOR SLEEP 30 tablet 5   No current facility-administered medications for this visit.      Past Medical History:  Diagnosis Date  . Arthritis   . Heart disease   . Hyperlipidemia   . Hypertension   . PUD (peptic ulcer  disease)   . Thyroid disease     Past Surgical History:  Procedure Laterality Date  . AORTIC VALVE REPLACEMENT    . TOTAL ABDOMINAL HYSTERECTOMY      Social History   Socioeconomic History  . Marital status: Married    Spouse name: Not on file  . Number of children: 2  . Years of education: Not on file  . Highest education level: Not on file  Occupational History  . Not on file  Social Needs  . Financial resource strain: Not on file  . Food insecurity:    Worry: Not on file    Inability: Not on file  . Transportation needs:    Medical: Not on file    Non-medical: Not on file  Tobacco Use  . Smoking status: Current Every Day Smoker    Types: E-cigarettes    Last  attempt to quit: 05/30/2013    Years since quitting: 4.8  . Smokeless tobacco: Never Used  Substance and Sexual Activity  . Alcohol use: No    Alcohol/week: 0.0 oz  . Drug use: No  . Sexual activity: Not Currently  Lifestyle  . Physical activity:    Days per week: Not on file    Minutes per session: Not on file  . Stress: Not on file  Relationships  . Social connections:    Talks on phone: Not on file    Gets together: Not on file    Attends religious service: Not on file    Active member of club or organization: Not on file    Attends meetings of clubs or organizations: Not on file    Relationship status: Not on file  . Intimate partner violence:    Fear of current or ex partner: Not on file    Emotionally abused: Not on file    Physically abused: Not on file    Forced sexual activity: Not on file  Other Topics Concern  . Not on file  Social History Narrative  . Not on file    Family History  Problem Relation Age of Onset  . Cancer Unknown   . Depression Unknown   . Diabetes Unknown   . Hypertension Unknown     ROS: no fevers or chills, productive cough, hemoptysis, dysphasia, odynophagia, melena, hematochezia, dysuria, hematuria, rash, seizure activity, orthopnea, PND, pedal edema, claudication. Remaining systems are negative.  Physical Exam: Well-developed well-nourished in no acute distress.  Skin is warm and dry.  HEENT is normal.  Neck is supple.  Chest is clear to auscultation with normal expansion.  Cardiovascular exam is regular rate and rhythm.  2/6 systolic murmur left sternal border. Abdominal exam nontender or distended. No masses palpated. Extremities show no edema. neuro grossly intact  ECG-sinus rhythm at a rate of 79, no ST changes.  Personally reviewed  A/P  1 status post aortic valve replacement-plan to continue SBE prophylaxis.  No new symptoms.  2 hypertension-blood pressure is controlled.  Continue present medications.  3  hyperlipidemia-continue statin.  Lipids and liver are followed by primary care.  [name], MD

## 2018-03-15 ENCOUNTER — Other Ambulatory Visit: Payer: Self-pay | Admitting: Physician Assistant

## 2018-03-21 ENCOUNTER — Ambulatory Visit: Payer: Medicaid Other | Admitting: Cardiology

## 2018-03-21 ENCOUNTER — Encounter: Payer: Self-pay | Admitting: Cardiology

## 2018-03-21 VITALS — BP 132/86 | HR 79 | Ht 64.0 in | Wt 116.4 lb

## 2018-03-21 DIAGNOSIS — I10 Essential (primary) hypertension: Secondary | ICD-10-CM | POA: Diagnosis not present

## 2018-03-21 DIAGNOSIS — I359 Nonrheumatic aortic valve disorder, unspecified: Secondary | ICD-10-CM

## 2018-03-21 DIAGNOSIS — M79672 Pain in left foot: Secondary | ICD-10-CM | POA: Diagnosis not present

## 2018-03-21 DIAGNOSIS — E7800 Pure hypercholesterolemia, unspecified: Secondary | ICD-10-CM | POA: Diagnosis not present

## 2018-03-21 MED ORDER — ASPIRIN EC 81 MG PO TBEC: 81.0000 mg | DELAYED_RELEASE_TABLET | Freq: Every day | ORAL | Status: DC

## 2018-03-21 NOTE — Patient Instructions (Signed)
Medication Instructions:   DECREASE ASPIRIN TO 81 MG ONCE DAILY  Follow-Up:  Your physician wants you to follow-up in: ONE YEAR WITH DR CRENSHAW You will receive a reminder letter in the mail two months in advance. If you don't receive a letter, please call our office to schedule the follow-up appointment.   If you need a refill on your cardiac medications before your next appointment, please call your pharmacy.    

## 2018-03-23 ENCOUNTER — Other Ambulatory Visit: Payer: Self-pay | Admitting: Physician Assistant

## 2018-03-23 ENCOUNTER — Other Ambulatory Visit: Payer: Self-pay

## 2018-03-23 DIAGNOSIS — L570 Actinic keratosis: Secondary | ICD-10-CM

## 2018-03-23 MED ORDER — TRIAMCINOLONE ACETONIDE 0.1 % EX CREA: TOPICAL_CREAM | CUTANEOUS | 0 refills | Status: DC

## 2018-03-23 MED ORDER — CEFDINIR 300 MG PO CAPS: 300.0000 mg | ORAL_CAPSULE | Freq: Two times a day (BID) | ORAL | 0 refills | Status: DC

## 2018-03-23 MED ORDER — MUPIROCIN 2 % EX OINT: TOPICAL_OINTMENT | CUTANEOUS | 1 refills | Status: DC

## 2018-03-23 NOTE — Telephone Encounter (Signed)
Follow up on Monday. Sent augmentin for cat bite.

## 2018-03-23 NOTE — Telephone Encounter (Signed)
Was returning call regarding some refills and Pt stated that her grandson brought in a cat and it bit her on the hand two nights ago. She wants to know if there is an antibiotic you can send. Denies any symptoms other than hand just hurts. She said skin isn't broken just puncture from bite. -[facility]MA

## 2018-03-26 NOTE — Telephone Encounter (Signed)
Called to talk to pt she stated that her hand is a whole lot better, and she loves you. -[facility]/CCMA

## 2018-03-29 ENCOUNTER — Other Ambulatory Visit: Payer: Self-pay | Admitting: Physician Assistant

## 2018-04-19 ENCOUNTER — Other Ambulatory Visit: Payer: Self-pay | Admitting: Family Medicine

## 2018-04-20 ENCOUNTER — Telehealth: Payer: Self-pay | Admitting: Physician Assistant

## 2018-04-25 ENCOUNTER — Other Ambulatory Visit: Payer: Self-pay | Admitting: Physician Assistant

## 2018-04-25 DIAGNOSIS — E039 Hypothyroidism, unspecified: Secondary | ICD-10-CM

## 2018-05-04 ENCOUNTER — Other Ambulatory Visit: Payer: Self-pay

## 2018-05-04 MED ORDER — METOPROLOL TARTRATE 25 MG PO TABS: ORAL_TABLET | ORAL | 1 refills | Status: DC

## 2018-05-10 ENCOUNTER — Other Ambulatory Visit: Payer: Self-pay | Admitting: Physician Assistant

## 2018-05-14 ENCOUNTER — Other Ambulatory Visit: Payer: Self-pay | Admitting: Physician Assistant

## 2018-05-15 ENCOUNTER — Other Ambulatory Visit: Payer: Self-pay

## 2018-05-15 MED ORDER — TRIAMCINOLONE ACETONIDE 0.1 % EX CREA: TOPICAL_CREAM | CUTANEOUS | 0 refills | Status: DC

## 2018-05-15 NOTE — Telephone Encounter (Signed)
Did she not get the 04/20/18 filled? If so she does not need to be taking that much muscle relaxer.

## 2018-05-15 NOTE — Telephone Encounter (Signed)
Requesting RF on Flexeril  Last RX sent 04-20-18 for #90, 1 TID PRN   Upcoming appt on 05-28-18   RX pended, please send if appropriate  Thanks!

## 2018-05-15 NOTE — Telephone Encounter (Signed)
Spoke to pt- she found another bottle.  OK to disregard request

## 2018-05-23 ENCOUNTER — Other Ambulatory Visit: Payer: Self-pay | Admitting: Physician Assistant

## 2018-05-23 DIAGNOSIS — F331 Major depressive disorder, recurrent, moderate: Secondary | ICD-10-CM

## 2018-05-24 ENCOUNTER — Telehealth: Payer: Self-pay

## 2018-05-24 NOTE — Telephone Encounter (Signed)
Rachael Douglas called and has an upcoming appointment with you on 05/28/2018. She was wanting to know if she can go ahead and have her labs done before her appointment? Please advise.

## 2018-05-25 NOTE — Telephone Encounter (Signed)
She could use a vitamin D and CMP. She does not need cholesterol checked as had in march of this year. Is there anything else she wants checked?   Ok to order once speaking with patient.

## 2018-05-28 ENCOUNTER — Encounter: Payer: Self-pay | Admitting: Physician Assistant

## 2018-05-28 ENCOUNTER — Ambulatory Visit (INDEPENDENT_AMBULATORY_CARE_PROVIDER_SITE_OTHER): Payer: Medicaid Other | Admitting: Physician Assistant

## 2018-05-28 ENCOUNTER — Telehealth: Payer: Self-pay

## 2018-05-28 VITALS — BP 139/69 | HR 77 | Ht 64.0 in | Wt 121.0 lb

## 2018-05-28 DIAGNOSIS — F419 Anxiety disorder, unspecified: Secondary | ICD-10-CM | POA: Diagnosis not present

## 2018-05-28 DIAGNOSIS — Z1231 Encounter for screening mammogram for malignant neoplasm of breast: Secondary | ICD-10-CM

## 2018-05-28 DIAGNOSIS — R222 Localized swelling, mass and lump, trunk: Secondary | ICD-10-CM

## 2018-05-28 DIAGNOSIS — L309 Dermatitis, unspecified: Secondary | ICD-10-CM

## 2018-05-28 DIAGNOSIS — Z79899 Other long term (current) drug therapy: Secondary | ICD-10-CM | POA: Diagnosis not present

## 2018-05-28 DIAGNOSIS — Z23 Encounter for immunization: Secondary | ICD-10-CM

## 2018-05-28 DIAGNOSIS — F331 Major depressive disorder, recurrent, moderate: Secondary | ICD-10-CM

## 2018-05-28 DIAGNOSIS — M24271 Disorder of ligament, right ankle: Secondary | ICD-10-CM

## 2018-05-28 DIAGNOSIS — M810 Age-related osteoporosis without current pathological fracture: Secondary | ICD-10-CM

## 2018-05-28 DIAGNOSIS — M545 Low back pain, unspecified: Secondary | ICD-10-CM

## 2018-05-28 DIAGNOSIS — G8929 Other chronic pain: Secondary | ICD-10-CM

## 2018-05-28 MED ORDER — HYDROXYZINE HCL 50 MG PO TABS: 50.0000 mg | ORAL_TABLET | Freq: Three times a day (TID) | ORAL | 1 refills | Status: DC | PRN

## 2018-05-28 MED ORDER — VITAMIN D (ERGOCALCIFEROL) 1.25 MG (50000 UNIT) PO CAPS: 50000.0000 [IU] | ORAL_CAPSULE | ORAL | 0 refills | Status: DC

## 2018-05-28 MED ORDER — ALPRAZOLAM 1 MG PO TABS: 1.0000 mg | ORAL_TABLET | Freq: Two times a day (BID) | ORAL | 5 refills | Status: DC

## 2018-05-28 MED ORDER — BUSPIRONE HCL 10 MG PO TABS: 10.0000 mg | ORAL_TABLET | Freq: Three times a day (TID) | ORAL | 1 refills | Status: DC

## 2018-05-28 NOTE — Patient Instructions (Signed)
Will call about getting on prolia.  Ordered bone density and mammogram.  Will call to get colonoscopy report.  Start buspar three times a day.  Vistaril only as needed.  Cut back on xanax to 1/2 tablets no more than twice a day.

## 2018-05-28 NOTE — Progress Notes (Signed)
   Subjective:    Patient ID: Rachael Douglas, female    DOB: 08/12/1956, 62 y.o.   MRN: [id number]  HPI    Review of Systems     Objective:   Physical Exam        Assessment & Plan:

## 2018-05-28 NOTE — Telephone Encounter (Signed)
Ok I would try tylenol cold sinus severe for a few days. If symptoms persist until Friday with no improvement call office. Most virus run coarse in 7 days.
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
## 2018-05-28 NOTE — Telephone Encounter (Signed)
Rachael Douglas called after her appointment today and she stated that she forgot to mention that she has had a sore throat and cough for the past few days, and was wanting to see if you could call her out an antibiotic? Or if she should try something else first to help. Please advise.

## 2018-05-30 ENCOUNTER — Encounter: Payer: Self-pay | Admitting: Physician Assistant

## 2018-05-30 DIAGNOSIS — L309 Dermatitis, unspecified: Secondary | ICD-10-CM | POA: Insufficient documentation

## 2018-05-30 MED ORDER — TRIAMCINOLONE 0.1 % CREAM:EUCERIN CREAM 1:1: 1.0000 "application " | TOPICAL_CREAM | Freq: Two times a day (BID) | CUTANEOUS | 3 refills | Status: DC | PRN

## 2018-05-31 DIAGNOSIS — Z23 Encounter for immunization: Secondary | ICD-10-CM | POA: Diagnosis not present

## 2018-05-31 DIAGNOSIS — G8929 Other chronic pain: Secondary | ICD-10-CM | POA: Insufficient documentation

## 2018-05-31 DIAGNOSIS — F419 Anxiety disorder, unspecified: Secondary | ICD-10-CM | POA: Insufficient documentation

## 2018-05-31 DIAGNOSIS — M545 Low back pain, unspecified: Secondary | ICD-10-CM | POA: Insufficient documentation

## 2018-05-31 DIAGNOSIS — R222 Localized swelling, mass and lump, trunk: Secondary | ICD-10-CM | POA: Insufficient documentation

## 2018-05-31 NOTE — Telephone Encounter (Signed)
Rachael Douglas said she is still using the Bactroban ointment and that she will use the Eucerin/triamcinolone cream with the Bactroban too.

## 2018-05-31 NOTE — Telephone Encounter (Signed)
I sent over the eucerin/triamcinolone cream that she can also combine with bactroban ointment(does she have the bactroban oinment)?

## 2018-05-31 NOTE — Telephone Encounter (Signed)
I thought she got a flu shot?   [facility]oscopy [facility]

## 2018-05-31 NOTE — Telephone Encounter (Signed)
Called Rachael Douglas and I did give her a flu shot on Monday. I have also documented that in the chart this morning. I will send out for results from her colonoscopy at [facility]. [place] also wanted me to tell you that she has a few sore spots on her legs and was wondering what she could possible due to help with that? Please advise.

## 2018-06-01 ENCOUNTER — Other Ambulatory Visit: Payer: Self-pay

## 2018-06-01 DIAGNOSIS — L309 Dermatitis, unspecified: Secondary | ICD-10-CM

## 2018-06-04 ENCOUNTER — Telehealth: Payer: Self-pay

## 2018-06-04 NOTE — Telephone Encounter (Signed)
Ok to send for husband too.

## 2018-06-04 NOTE — Telephone Encounter (Signed)
Rachael Douglas called and said that the cream you prescribed for her is working well on her sore spots on her legs. She was calling to let you know that her husband Rachael Douglas is having those same sore spots but they are on his stomach. She just wanted to see if we could possibly do something for him too? Thanks so much!

## 2018-06-06 ENCOUNTER — Ambulatory Visit (INDEPENDENT_AMBULATORY_CARE_PROVIDER_SITE_OTHER): Payer: Medicaid Other

## 2018-06-06 DIAGNOSIS — M810 Age-related osteoporosis without current pathological fracture: Secondary | ICD-10-CM

## 2018-06-06 DIAGNOSIS — Z1231 Encounter for screening mammogram for malignant neoplasm of breast: Secondary | ICD-10-CM | POA: Diagnosis not present

## 2018-06-07 NOTE — Progress Notes (Signed)
Call pt: normal mammogram. Follow up in 1 year.

## 2018-06-07 NOTE — Progress Notes (Signed)
Pt is osteoporotic. She is not on treatment. Sent a note about prolia getting approved. Can we find out where we are in process and let patient know bone density has worsened since tested in 2016.

## 2018-06-08 ENCOUNTER — Ambulatory Visit: Payer: Self-pay | Admitting: Physician Assistant

## 2018-06-11 ENCOUNTER — Other Ambulatory Visit: Payer: Self-pay

## 2018-06-11 MED ORDER — CYCLOBENZAPRINE HCL 10 MG PO TABS: ORAL_TABLET | ORAL | 0 refills | Status: DC

## 2018-06-19 ENCOUNTER — Other Ambulatory Visit: Payer: Self-pay | Admitting: Physician Assistant

## 2018-06-19 DIAGNOSIS — F331 Major depressive disorder, recurrent, moderate: Secondary | ICD-10-CM

## 2018-06-23 ENCOUNTER — Other Ambulatory Visit: Payer: Self-pay | Admitting: Physician Assistant

## 2018-06-25 ENCOUNTER — Encounter: Payer: Self-pay | Admitting: Physician Assistant

## 2018-06-25 ENCOUNTER — Ambulatory Visit (INDEPENDENT_AMBULATORY_CARE_PROVIDER_SITE_OTHER): Payer: Medicaid Other | Admitting: Physician Assistant

## 2018-06-25 VITALS — BP 120/58 | HR 69 | Ht 64.0 in | Wt 121.0 lb

## 2018-06-25 DIAGNOSIS — G8929 Other chronic pain: Secondary | ICD-10-CM

## 2018-06-25 DIAGNOSIS — R222 Localized swelling, mass and lump, trunk: Secondary | ICD-10-CM

## 2018-06-25 DIAGNOSIS — M545 Low back pain, unspecified: Secondary | ICD-10-CM
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
## 2018-06-25 NOTE — Patient Instructions (Signed)
Will get 2nd opinion from ortho.  Will call and get colonoscopy report from [facility].  Will get kelsi to get started on prolia.

## 2018-06-25 NOTE — Progress Notes (Signed)
120/58 69  

## 2018-07-01 ENCOUNTER — Encounter: Payer: Self-pay | Admitting: Physician Assistant

## 2018-07-01 ENCOUNTER — Telehealth: Payer: Self-pay | Admitting: Physician Assistant

## 2018-07-01 NOTE — Telephone Encounter (Signed)
Needs to be approved for prolia and get injection.

## 2018-07-02 NOTE — Telephone Encounter (Signed)
Can we call patient and ask if she would be willing to try fosamax weekly or has she already tried this medication?

## 2018-07-02 NOTE — Telephone Encounter (Signed)
Prolia ordered. Has been routed for PA review.

## 2018-07-02 NOTE — Telephone Encounter (Signed)
Patients insurance does not cover Prolia and is not preferred and does not give me an option to appeal. Preferred by insurance is generic Fosamax.

## 2018-07-04 ENCOUNTER — Other Ambulatory Visit: Payer: Self-pay

## 2018-07-04 DIAGNOSIS — E039 Hypothyroidism, unspecified: Secondary | ICD-10-CM

## 2018-07-04 MED ORDER — LEVOTHYROXINE SODIUM 75 MCG PO TABS: ORAL_TABLET | ORAL | 0 refills | Status: DC

## 2018-07-04 NOTE — Telephone Encounter (Signed)
Form has been sent for verification. Waiting on response.

## 2018-07-06 NOTE — Telephone Encounter (Signed)
Prolia is not covered by insurance.

## 2018-07-10 ENCOUNTER — Other Ambulatory Visit: Payer: Self-pay

## 2018-07-10 DIAGNOSIS — L309 Dermatitis, unspecified: Secondary | ICD-10-CM

## 2018-07-10 MED ORDER — TRIAMCINOLONE 0.1 % CREAM:EUCERIN CREAM 1:1: 1.0000 "application " | TOPICAL_CREAM | Freq: Two times a day (BID) | CUTANEOUS | 3 refills | Status: DC | PRN

## 2018-07-12 ENCOUNTER — Encounter: Payer: Self-pay | Admitting: Physician Assistant

## 2018-07-15 ENCOUNTER — Other Ambulatory Visit: Payer: Self-pay | Admitting: Physician Assistant

## 2018-07-15 DIAGNOSIS — F331 Major depressive disorder, recurrent, moderate: Secondary | ICD-10-CM

## 2018-07-16 ENCOUNTER — Other Ambulatory Visit: Payer: Self-pay | Admitting: Physician Assistant

## 2018-07-26 ENCOUNTER — Telehealth: Payer: Self-pay

## 2018-07-26 DIAGNOSIS — R233 Spontaneous ecchymoses: Secondary | ICD-10-CM

## 2018-07-26 DIAGNOSIS — R238 Other skin changes: Secondary | ICD-10-CM

## 2018-07-26 NOTE — Telephone Encounter (Signed)
During my call with Rachael Douglas she also stated that lately she has been bruising very easily. She mainly notices it in the mornings. Patient states the bruises do not hurt but she is worried about why and how they are appearing. She states they are only on her arms. Please advise. Thanks!

## 2018-07-26 NOTE — Telephone Encounter (Signed)
Rachael Douglas called today saying that she is filling out forms for Medicaid, and was informed that the patient's typically have their PCP help them determine which plan is best for them? I did not know this, so I just wanted to ask you first, but she would like some help determining which plan she should use. Thanks!

## 2018-07-26 NOTE — Telephone Encounter (Signed)
Ok does she have a layout of the plans. I can review them and get back with her.

## 2018-07-26 NOTE — Telephone Encounter (Signed)
Easy bruising could come from being on asa, fish oil, topical steroids on skin, and age. Platelets were on low side of normal in may. If bleeding from nose or ears please let me know. We can always recheck platelets at next visit.

## 2018-07-27 NOTE — Telephone Encounter (Signed)
Called patient and she states she currently does not have a layout of the plans, but she is going to get one together and will then let us know.

## 2018-07-27 NOTE — Telephone Encounter (Signed)
Called and notified patient and she would like to have platelets rechecked when possible.

## 2018-07-30 NOTE — Telephone Encounter (Signed)
Orders have been faxed to lab, will call patient and notify her to have labs done.

## 2018-07-30 NOTE — Telephone Encounter (Signed)
Order for CBC printed and can be faxed to lab.

## 2018-08-06 ENCOUNTER — Telehealth: Payer: Self-pay

## 2018-08-06 NOTE — Telephone Encounter (Signed)
Rachael Douglas called today saying that at her most recent appointment to see her pain doctor, they wanted to change her Alprazolam to 1/2 tablet taken twice daily. She just wanted to let you know. Thanks!

## 2018-08-07 NOTE — Telephone Encounter (Signed)
Ok so I need to send new rx to pharmacy? Can we call pharmacy and cancel on the remainder refills and confirm when her last pick up date was so I can post date.

## 2018-08-13 ENCOUNTER — Other Ambulatory Visit: Payer: Self-pay | Admitting: Physician Assistant

## 2018-08-13 DIAGNOSIS — F331 Major depressive disorder, recurrent, moderate: Secondary | ICD-10-CM

## 2018-08-21 ENCOUNTER — Other Ambulatory Visit: Payer: Self-pay

## 2018-08-21 MED ORDER — GABAPENTIN 600 MG PO TABS: 1200.0000 mg | ORAL_TABLET | Freq: Three times a day (TID) | ORAL | 0 refills | Status: DC

## 2018-08-23 ENCOUNTER — Other Ambulatory Visit: Payer: Self-pay | Admitting: Physician Assistant

## 2018-08-23 DIAGNOSIS — F419 Anxiety disorder, unspecified: Secondary | ICD-10-CM

## 2018-08-27 ENCOUNTER — Other Ambulatory Visit: Payer: Self-pay | Admitting: Physician Assistant

## 2018-08-27 DIAGNOSIS — F5101 Primary insomnia: Secondary | ICD-10-CM

## 2018-08-28 ENCOUNTER — Ambulatory Visit: Payer: Self-pay | Admitting: Physician Assistant

## 2018-08-31 NOTE — Telephone Encounter (Signed)
Error

## 2018-09-03 ENCOUNTER — Encounter: Payer: Self-pay | Admitting: Physician Assistant

## 2018-09-03 ENCOUNTER — Ambulatory Visit (INDEPENDENT_AMBULATORY_CARE_PROVIDER_SITE_OTHER): Payer: Medicaid Other | Admitting: Physician Assistant

## 2018-09-03 VITALS — BP 131/67 | HR 70 | Ht 64.0 in | Wt 123.0 lb

## 2018-09-03 DIAGNOSIS — F411 Generalized anxiety disorder: Secondary | ICD-10-CM

## 2018-09-03 DIAGNOSIS — F331 Major depressive disorder, recurrent, moderate: Secondary | ICD-10-CM | POA: Diagnosis not present

## 2018-09-03 DIAGNOSIS — F329 Major depressive disorder, single episode, unspecified: Secondary | ICD-10-CM

## 2018-09-03 DIAGNOSIS — F419 Anxiety disorder, unspecified: Secondary | ICD-10-CM | POA: Diagnosis not present

## 2018-09-03 DIAGNOSIS — F32A Depression, unspecified: Secondary | ICD-10-CM

## 2018-09-03 DIAGNOSIS — F5101 Primary insomnia: Secondary | ICD-10-CM

## 2018-09-03 DIAGNOSIS — R233 Spontaneous ecchymoses: Secondary | ICD-10-CM

## 2018-09-03 DIAGNOSIS — L570 Actinic keratosis: Secondary | ICD-10-CM | POA: Diagnosis not present

## 2018-09-03 DIAGNOSIS — R238 Other skin changes: Secondary | ICD-10-CM

## 2018-09-03 LAB — POCT HEMOGLOBIN: Hemoglobin: 13.3 g/dL (ref 11–14.6)

## 2018-09-03 MED ORDER — ZOLPIDEM TARTRATE 10 MG PO TABS: 10.0000 mg | ORAL_TABLET | Freq: Every evening | ORAL | 5 refills | Status: DC | PRN

## 2018-09-03 NOTE — Progress Notes (Signed)
Subjective:    Patient ID: Rachael Douglas, female    DOB: November 09, 1955, 62 y.o.   MRN: [id number]  HPI Pt is a 62 yo female who presents to the clinic to have some AK's frozen off.   Needs AK treated with cryotherapy.   She continues to be very anxious. The holidays are also really hard emotionally. She needs refill. Sleeping is better as long as she takes medication.   Continues to easy bruise. On ASA.   .. Active Ambulatory Problems    Diagnosis Date Noted  . Hypothyroidism 05/16/2014  . Anxiety disorder 05/16/2014  . Aortic heart valve narrowing 04/12/2013  . Arthropathia 07/10/2008  . Leg pain 12/04/2012  . DDD (degenerative disc disease) 05/16/2014  . Anxiety and depression 05/16/2014  . Gastric catarrh 08/30/2013  . Acid reflux 05/16/2014  . HLD (hyperlipidemia) 05/16/2014  . Arteriosclerosis of coronary artery 08/30/2013  . OP (osteoporosis) 05/16/2014  . Major depressive disorder, recurrent episode (HCC) 05/16/2014  . COPD, moderate (HCC) 09/25/2014  . Stricture and stenosis of esophagus 10/23/2014  . Aortic valve replaced 10/23/2014  . Constipation 12/17/2014  . Complication of surgery 11/13/2015  . Left foot pain 02/05/2016  . Failure to attend appointment 05/05/2016  . Diarrhea 08/01/2016  . Multiple gastric ulcers 09/05/2016  . Collagenous colitis 09/09/2016  . Right shoulder pain 11/23/2016  . Left shoulder pain 11/23/2016  . Trochanteric bursitis of both hips 12/07/2016  . Primary insomnia 05/16/2017  . Chronic prescription benzodiazepine use 07/04/2017  . Rash and nonspecific skin eruption 07/04/2017  . Left carotid bruit 08/15/2017  . Actinic keratoses 12/03/2017  . Pruritus 02/21/2018  . Ankle ligament laxity, right 05/28/2018  . Eczema 05/30/2018  . Palpable mass of lower back 05/31/2018  . Chronic bilateral low back pain without sciatica 05/31/2018  . Anxiety 05/31/2018  . Easy bruising 09/12/2018   Resolved Ambulatory Problems    Diagnosis  Date Noted  . No Resolved Ambulatory Problems   Past Medical History:  Diagnosis Date  . Arthritis   . Heart disease   . Hyperlipidemia   . Hypertension   . PUD (peptic ulcer disease)   . Thyroid disease     Review of Systems  All other systems reviewed and are negative.      Objective:   Physical Exam Vitals signs reviewed.  Constitutional:      Appearance: Normal appearance.  HENT:     Head: Normocephalic and atraumatic.  Cardiovascular:     Rate and Rhythm: Normal rate and regular rhythm.     Pulses: Normal pulses.     Heart sounds: Normal heart sounds.  Pulmonary:     Effort: Pulmonary effort is normal.     Breath sounds: Normal breath sounds.  Skin:    General: Skin is warm.     Comments: Multiple areas of hyperkeratoic skin on a erythematous base on hands and forearms.   On area on right ear.   Neurological:     General: No focal deficit present.     Mental Status: She is alert and oriented to person, place, and time.  Psychiatric:        Mood and Affect: Mood normal.        Behavior: Behavior normal.           Assessment & Plan:  [name].[name].Rachael Douglas was seen today for anxiety.  Diagnoses and all orders for this visit:  Actinic keratosis  Generalized anxiety disorder  Anxiety and depression  Moderate episode  of recurrent major depressive disorder (HCC)  Primary insomnia -     zolpidem (AMBIEN) 10 MG tablet; Take 1 tablet (10 mg total) by mouth at bedtime as needed. for sleep  Easy bruising -     POCT hemoglobin  refilled mediation.   .. Results for orders placed or performed in visit on 09/03/18  POCT hemoglobin  Result Value Ref Range   Hemoglobin 13.3 11 - 14.6 g/dL   hgb great.   Cryotherapy Procedure Note  Pre-operative Diagnosis: Actinic keratosis  Post-operative Diagnosis: Actinic keratosis  Locations: bilateral forearms, hands.   Indications: precancerous  Procedure Details  History of allergy to iodine: no. Pacemaker?  no.  Patient informed of risks (permanent scarring, infection, light or dark discoloration, bleeding, infection, weakness, numbness and recurrence of the lesion) and benefits of the procedure and verbal informed consent obtained.  The areas are treated with liquid nitrogen therapy, frozen until ice ball extended 2 mm beyond lesion, allowed to thaw, and treated again. The patient tolerated procedure well.  The patient was instructed on post-op care, warned that there may be blister formation, redness and pain. Recommend OTC analgesia as needed for pain.  Condition: Stable  Complications: none.  Plan: 1. Instructed to keep the area dry and covered for 24-48h and clean thereafter. 2. Warning signs of infection were reviewed.   3. Recommended that the patient use OTC acetaminophen as needed for pain.    Xanax was already sent via EMR. Discussed using with caution.

## 2018-09-05 ENCOUNTER — Telehealth: Payer: Self-pay | Admitting: Physician Assistant

## 2018-09-05 NOTE — Telephone Encounter (Signed)
Patient left a VM about a PA for her Ambien. I have filled the form out and had Jade sign it. Faxed to [employer].

## 2018-09-06 ENCOUNTER — Other Ambulatory Visit: Payer: Self-pay | Admitting: Physician Assistant

## 2018-09-06 DIAGNOSIS — M810 Age-related osteoporosis without current pathological fracture: Secondary | ICD-10-CM

## 2018-09-12 DIAGNOSIS — R233 Spontaneous ecchymoses: Secondary | ICD-10-CM | POA: Insufficient documentation

## 2018-09-12 DIAGNOSIS — R238 Other skin changes: Secondary | ICD-10-CM | POA: Insufficient documentation

## 2018-09-13 ENCOUNTER — Other Ambulatory Visit: Payer: Self-pay | Admitting: Physician Assistant

## 2018-09-13 ENCOUNTER — Telehealth: Payer: Self-pay

## 2018-09-13 NOTE — Telephone Encounter (Signed)
Patient called again today saying that the sores that were on her arms the last time she was here have now started to develop and worsen on her legs. Patient wanted to know if there was anything else that can be done to help this? Thanks!

## 2018-09-14 ENCOUNTER — Telehealth: Payer: Self-pay

## 2018-09-14 NOTE — Telephone Encounter (Signed)
We can try freezing some of them off leg at next visit, she can continue to use the cream in the jar with LOTS of moisturizer. I would apply moisture at least 3 times a day.

## 2018-09-14 NOTE — Telephone Encounter (Signed)
Called patient today to let her know of your recommendations for her legs, and patient said that she ment to say that the creams were actually helping her legs and arms. Patient reports using the creams everyday, and they are beginning to dry up the spots. Patient did want me to let you know that before the end of the month, she will need a refill on her flexeril if possible. Thanks!

## 2018-09-17 NOTE — Telephone Encounter (Signed)
Called [employer] and spoke with Johnny BridgeMartha and she advised me that she did not see anywhere that the form was received on their end. She give me a one time fax to get this processed.   I called the patient to give her and update and she voices understanding. She did not have any further questions. I told her I would let her know when I had more information.

## 2018-09-17 NOTE — Telephone Encounter (Signed)
Contacted patient and she said the cream is actually helping the spots on her arms and legs. Patient states that if the cream does not work then she will consider freezing the spots. Patient states she is feeling much better right now. No further questions or concerns at this time.

## 2018-09-20 ENCOUNTER — Other Ambulatory Visit: Payer: Self-pay | Admitting: Physician Assistant

## 2018-09-24 ENCOUNTER — Other Ambulatory Visit: Payer: Self-pay | Admitting: Physician Assistant

## 2018-09-25 ENCOUNTER — Ambulatory Visit (INDEPENDENT_AMBULATORY_CARE_PROVIDER_SITE_OTHER): Payer: Medicaid Other | Admitting: Physician Assistant

## 2018-09-25 ENCOUNTER — Encounter: Payer: Self-pay | Admitting: Physician Assistant

## 2018-09-25 VITALS — BP 133/59 | HR 69 | Temp 98.4°F | Ht 64.0 in | Wt 128.0 lb

## 2018-09-25 DIAGNOSIS — N3000 Acute cystitis without hematuria: Secondary | ICD-10-CM

## 2018-09-25 DIAGNOSIS — R8290 Unspecified abnormal findings in urine: Secondary | ICD-10-CM

## 2018-09-25 DIAGNOSIS — J069 Acute upper respiratory infection, unspecified: Secondary | ICD-10-CM | POA: Diagnosis not present

## 2018-09-25 DIAGNOSIS — F5101 Primary insomnia: Secondary | ICD-10-CM

## 2018-09-25 LAB — POCT URINALYSIS DIPSTICK
Bilirubin, UA: NEGATIVE
Glucose, UA: NEGATIVE
Ketones, UA: NEGATIVE
Leukocytes, UA: NEGATIVE
Nitrite, UA: POSITIVE
Protein, UA: NEGATIVE
RBC UA: NEGATIVE
Spec Grav, UA: 1.02 (ref 1.010–1.025)
Urobilinogen, UA: 0.2 E.U./dL
pH, UA: 5.5 (ref 5.0–8.0)

## 2018-09-25 MED ORDER — TRAZODONE HCL 50 MG PO TABS: ORAL_TABLET | ORAL | 2 refills | Status: DC

## 2018-09-25 MED ORDER — SULFAMETHOXAZOLE-TRIMETHOPRIM 800-160 MG PO TABS: 1.0000 | ORAL_TABLET | Freq: Two times a day (BID) | ORAL | 0 refills | Status: DC

## 2018-09-25 MED ORDER — BENZONATATE 200 MG PO CAPS: 200.0000 mg | ORAL_CAPSULE | Freq: Two times a day (BID) | ORAL | 0 refills | Status: DC | PRN

## 2018-09-25 NOTE — Progress Notes (Signed)
Subjective:    Patient ID: Rachael Douglas, female    DOB: 01-Oct-1955, 63 y.o.   MRN: [id number]  HPI Pt is a 63 yo female who presents to the clinic with foul smelling urine and increase in frequency as well as cough.   Noticed urine symptoms for about a week. They are getting progressively worse. No fever, chills, nausea. She is having some achy pain in her left back area. Not taking anything.   She has also had a cough for about one week. Reports sinus pressure, ear pressure, ST. No wheezing or SOB. Cough is not productive. Taking mucinex OTC.   She also brings in letter from [employer] stating Remus Loffler is no longer covered. She has tried and failed lunesta.   .. Active Ambulatory Problems    Diagnosis Date Noted  . Hypothyroidism 05/16/2014  . Anxiety disorder 05/16/2014  . Aortic heart valve narrowing 04/12/2013  . Arthropathia 07/10/2008  . Leg pain 12/04/2012  . DDD (degenerative disc disease) 05/16/2014  . Anxiety and depression 05/16/2014  . Gastric catarrh 08/30/2013  . Acid reflux 05/16/2014  . HLD (hyperlipidemia) 05/16/2014  . Arteriosclerosis of coronary artery 08/30/2013  . OP (osteoporosis) 05/16/2014  . Major depressive disorder, recurrent episode (HCC) 05/16/2014  . COPD, moderate (HCC) 09/25/2014  . Stricture and stenosis of esophagus 10/23/2014  . Aortic valve replaced 10/23/2014  . Constipation 12/17/2014  . Complication of surgery 11/13/2015  . Left foot pain 02/05/2016  . Failure to attend appointment 05/05/2016  . Diarrhea 08/01/2016  . Multiple gastric ulcers 09/05/2016  . Collagenous colitis 09/09/2016  . Right shoulder pain 11/23/2016  . Left shoulder pain 11/23/2016  . Trochanteric bursitis of both hips 12/07/2016  . Primary insomnia 05/16/2017  . Chronic prescription benzodiazepine use 07/04/2017  . Rash and nonspecific skin eruption 07/04/2017  . Left carotid bruit 08/15/2017  . Actinic keratoses 12/03/2017  . Pruritus 02/21/2018  .  Ankle ligament laxity, right 05/28/2018  . Eczema 05/30/2018  . Palpable mass of lower back 05/31/2018  . Chronic bilateral low back pain without sciatica 05/31/2018  . Anxiety 05/31/2018  . Easy bruising 09/12/2018   Resolved Ambulatory Problems    Diagnosis Date Noted  . No Resolved Ambulatory Problems   Past Medical History:  Diagnosis Date  . Arthritis   . Heart disease   . Hyperlipidemia   . Hypertension   . PUD (peptic ulcer disease)   . Thyroid disease      Review of Systems    see HPI.  Objective:   Physical Exam Vitals signs reviewed.  Constitutional:      Appearance: Normal appearance.  HENT:     Head: Normocephalic and atraumatic.     Right Ear: Tympanic membrane and ear canal normal.     Left Ear: Tympanic membrane and ear canal normal.     Nose: Congestion present.     Mouth/Throat:     Mouth: Mucous membranes are moist.     Pharynx: Posterior oropharyngeal erythema present. No oropharyngeal exudate.  Eyes:     Conjunctiva/sclera: Conjunctivae normal.  Cardiovascular:     Rate and Rhythm: Normal rate and regular rhythm.     Heart sounds: Murmur present.  Pulmonary:     Effort: Pulmonary effort is normal.     Breath sounds: Normal breath sounds.  Abdominal:     General: Bowel sounds are normal. There is no distension.     Palpations: Abdomen is soft.     Tenderness: There  is no abdominal tenderness. There is left CVA tenderness. There is no guarding or rebound.  Neurological:     Mental Status: She is alert.  Psychiatric:        Mood and Affect: Mood normal.        Behavior: Behavior normal.           Assessment & Plan:  [name].[name].Rachael Douglas was seen today for cough.  Diagnoses and all orders for this visit:  Acute cystitis without hematuria -     sulfamethoxazole-trimethoprim (BACTRIM DS,SEPTRA DS) 800-160 MG tablet; Take 1 tablet by mouth 2 (two) times daily. For 7 days.  Foul smelling urine -     POCT Urinalysis Dipstick -     Urine  Culture  Primary insomnia -     traZODone (DESYREL) 50 MG tablet; Take one to 2 tablets 1 hour before bed.  Upper respiratory tract infection, unspecified type -     benzonatate (TESSALON) 200 MG capsule; Take 1 capsule (200 mg total) by mouth 2 (two) times daily as needed for cough.   .. Results for orders placed or performed in visit on 09/25/18  POCT Urinalysis Dipstick  Result Value Ref Range   Color, UA dark yellow    Clarity, UA clear    Glucose, UA Negative Negative   Bilirubin, UA negative    Ketones, UA negative    Spec Grav, UA 1.020 1.010 - 1.025   Blood, UA negative    pH, UA 5.5 5.0 - 8.0   Protein, UA Negative Negative   Urobilinogen, UA 0.2 0.2 or 1.0 E.U./dL   Nitrite, UA positive    Leukocytes, UA Negative Negative   Appearance     Odor     Treated for UTI. Culture to confirm abx will treat. Discussed symptomatic care.   Discussed cough is likely viral. Gave HO on symptomatic care. Cough drops, humidifer can help. deslym at bedtime.   Will try trazodone for sleep to replace ambien. If fails cold submit a PA to insurance company. Give at least 1 month.

## 2018-09-25 NOTE — Patient Instructions (Signed)
Urinary Tract Infection, Adult A urinary tract infection (UTI) is an infection of any part of the urinary tract. The urinary tract includes:  The kidneys.  The ureters.  The bladder.  The urethra. These organs make, store, and get rid of pee (urine) in the body. What are the causes? This is caused by germs (bacteria) in your genital area. These germs grow and cause swelling (inflammation) of your urinary tract. What increases the risk? You are more likely to develop this condition if:  You have a small, thin tube (catheter) to drain pee.  You cannot control when you pee or poop (incontinence).  You are female, and: ? You use these methods to prevent pregnancy: ? A medicine that kills sperm (spermicide). ? A device that blocks sperm (diaphragm). ? You have low levels of a female hormone (estrogen). ? You are pregnant.  You have genes that add to your risk.  You are sexually active.  You take antibiotic medicines.  You have trouble peeing because of: ? A prostate that is bigger than normal, if you are female. ? A blockage in the part of your body that drains pee from the bladder (urethra). ? A kidney stone. ? A nerve condition that affects your bladder (neurogenic bladder). ? Not getting enough to drink. ? Not peeing often enough.  You have other conditions, such as: ? Diabetes. ? A weak disease-fighting system (immune system). ? Sickle cell disease. ? Gout. ? Injury of the spine. What are the signs or symptoms? Symptoms of this condition include:  Needing to pee right away (urgently).  Peeing often.  Peeing small amounts often.  Pain or burning when peeing.  Blood in the pee.  Pee that smells bad or not like normal.  Trouble peeing.  Pee that is cloudy.  Fluid coming from the vagina, if you are female.  Pain in the belly or lower back. Other symptoms include:  Throwing up (vomiting).  No urge to eat.  Feeling mixed up (confused).  Being tired  and grouchy (irritable).  A fever.  Watery poop (diarrhea). How is this treated? This condition may be treated with:  Antibiotic medicine.  Other medicines.  Drinking enough water. Follow these instructions at home:  Medicines  Take over-the-counter and prescription medicines only as told by your doctor.  If you were prescribed an antibiotic medicine, take it as told by your doctor. Do not stop taking it even if you start to feel better. General instructions  Make sure you: ? Pee until your bladder is empty. ? Do not hold pee for a long time. ? Empty your bladder after sex. ? Wipe from front to back after pooping if you are a female. Use each tissue one time when you wipe.  Drink enough fluid to keep your pee pale yellow.  Keep all follow-up visits as told by your doctor. This is important. Contact a doctor if:  You do not get better after 1-2 days.  Your symptoms go away and then come back. Get help right away if:  You have very bad back pain.  You have very bad pain in your lower belly.  You have a fever.  You are sick to your stomach (nauseous).  You are throwing up. Summary  A urinary tract infection (UTI) is an infection of any part of the urinary tract.  This condition is caused by germs in your genital area.  There are many risk factors for a UTI. These include having a small, thin   tube to drain pee and not being able to control when you pee or poop.  Treatment includes antibiotic medicines for germs.  Drink enough fluid to keep your pee pale yellow. This information is not intended to replace advice given to you by your health care provider. Make sure you discuss any questions you have with your health care provider. Document Released: 02/22/2008 Document Revised: 03/15/2018 Document Reviewed: 03/15/2018 Elsevier Interactive Patient Education  2019 Elsevier Inc.  

## 2018-09-26 ENCOUNTER — Ambulatory Visit: Payer: Self-pay | Admitting: Physician Assistant

## 2018-09-26 NOTE — Telephone Encounter (Signed)
Ambien is not covered. Patient has follow up with PCP to discuss.

## 2018-09-27 LAB — URINE CULTURE
MICRO NUMBER:: 21960
SPECIMEN QUALITY:: ADEQUATE

## 2018-09-27 NOTE — Progress Notes (Signed)
Call pt: confirmed UTI. Bactrim should treat.

## 2018-10-12 ENCOUNTER — Other Ambulatory Visit: Payer: Self-pay | Admitting: Physician Assistant

## 2018-10-12 DIAGNOSIS — F419 Anxiety disorder, unspecified: Secondary | ICD-10-CM

## 2018-10-23 ENCOUNTER — Other Ambulatory Visit: Payer: Self-pay | Admitting: Physician Assistant

## 2018-11-01 ENCOUNTER — Other Ambulatory Visit: Payer: Self-pay | Admitting: Physician Assistant

## 2018-11-04 ENCOUNTER — Other Ambulatory Visit: Payer: Self-pay | Admitting: Physician Assistant

## 2018-11-15 ENCOUNTER — Other Ambulatory Visit: Payer: Self-pay | Admitting: Family Medicine

## 2018-11-15 DIAGNOSIS — E039 Hypothyroidism, unspecified: Secondary | ICD-10-CM

## 2018-11-26 ENCOUNTER — Other Ambulatory Visit: Payer: Self-pay | Admitting: Physician Assistant

## 2018-11-26 DIAGNOSIS — F331 Major depressive disorder, recurrent, moderate: Secondary | ICD-10-CM

## 2018-12-09 ENCOUNTER — Other Ambulatory Visit: Payer: Self-pay | Admitting: Physician Assistant

## 2018-12-09 DIAGNOSIS — F419 Anxiety disorder, unspecified: Secondary | ICD-10-CM

## 2018-12-10 ENCOUNTER — Other Ambulatory Visit: Payer: Self-pay | Admitting: Physician Assistant

## 2018-12-10 MED ORDER — ALPRAZOLAM 1 MG PO TABS: 1.0000 mg | ORAL_TABLET | Freq: Two times a day (BID) | ORAL | 0 refills | Status: DC

## 2018-12-10 NOTE — Telephone Encounter (Signed)
Confirm patient is using three times a day every day?

## 2018-12-10 NOTE — Telephone Encounter (Signed)
Yes, 3x day.   Also requesting RF on Xanax

## 2018-12-10 NOTE — Telephone Encounter (Signed)
RX last written 10/24/18 for #90 with 0 refills.   RX pended, please send if appropriate

## 2018-12-12 ENCOUNTER — Telehealth: Payer: Self-pay | Admitting: Physician Assistant

## 2018-12-12 DIAGNOSIS — E039 Hypothyroidism, unspecified: Secondary | ICD-10-CM

## 2018-12-12 NOTE — Telephone Encounter (Signed)
Received a request from [facility] for patient's levothyroxine refill. I see per last note patient was due for blood work. She wants to know if you can give her another 30 days of medication with all this virus going around and then she will come for labs before this refill is out.  I have placed the order for the TSH and will fax to lab. Please advise.

## 2018-12-17 MED ORDER — LEVOTHYROXINE SODIUM 75 MCG PO TABS: 75.0000 ug | ORAL_TABLET | Freq: Every day | ORAL | 1 refills | Status: DC

## 2018-12-24 ENCOUNTER — Other Ambulatory Visit: Payer: Self-pay | Admitting: Physician Assistant

## 2018-12-24 DIAGNOSIS — F5101 Primary insomnia: Secondary | ICD-10-CM

## 2018-12-27 ENCOUNTER — Other Ambulatory Visit: Payer: Self-pay | Admitting: Neurology

## 2018-12-27 MED ORDER — OMEPRAZOLE 40 MG PO CPDR: 40.0000 mg | DELAYED_RELEASE_CAPSULE | Freq: Two times a day (BID) | ORAL | 0 refills | Status: DC

## 2019-01-07 ENCOUNTER — Telehealth: Payer: Self-pay | Admitting: Neurology

## 2019-01-07 NOTE — Telephone Encounter (Signed)
Are you having abdominal pain? Linzess is for chronic constipation and best results when taking daily. Are you willing to take this daily?

## 2019-01-07 NOTE — Telephone Encounter (Signed)
Patient called and left vm stating she has not had a BM since last week and would like [place] to send Linzess to [place] at [address]   Jade - please advise.

## 2019-01-08 ENCOUNTER — Encounter: Payer: Self-pay | Admitting: Physician Assistant

## 2019-01-08 ENCOUNTER — Ambulatory Visit (INDEPENDENT_AMBULATORY_CARE_PROVIDER_SITE_OTHER): Payer: Medicaid Other | Admitting: Physician Assistant

## 2019-01-08 ENCOUNTER — Other Ambulatory Visit: Payer: Self-pay

## 2019-01-08 VITALS — BP 103/64 | HR 72 | Ht 64.0 in | Wt 124.0 lb

## 2019-01-08 DIAGNOSIS — F331 Major depressive disorder, recurrent, moderate: Secondary | ICD-10-CM | POA: Diagnosis not present

## 2019-01-08 DIAGNOSIS — K5900 Constipation, unspecified: Secondary | ICD-10-CM

## 2019-01-08 MED ORDER — LINACLOTIDE 145 MCG PO CAPS: 145.0000 ug | ORAL_CAPSULE | Freq: Every day | ORAL | 5 refills | Status: DC

## 2019-01-08 MED ORDER — DULOXETINE HCL 60 MG PO CPEP: ORAL_CAPSULE | ORAL | 5 refills | Status: DC

## 2019-01-08 NOTE — Telephone Encounter (Signed)
Done

## 2019-01-08 NOTE — Progress Notes (Signed)
Patient ID: Rachael Douglas, female   DOB: [birth year], [age] y.o.   MRN: [id number] .[name].Virtual Visit via Telephone Note  I connected with Rachael Douglas on 01/08/19 at 11:10 AM EDT by telephone and verified that I am speaking with the correct person using two identifiers.   I discussed the limitations, risks, security and privacy concerns of performing an evaluation and management service by telephone and the availability of in person appointments. I also discussed with the patient that there may be a patient responsible charge related to this service. The patient expressed understanding and agreed to proceed.   History of Present Illness: Pt is a 63 yo female with hx of opoid induced constipation who calls in needing linzess. Pt used linzess when she was taking daily opioids. She backed off linzess  when stop pain medication. She usually can manage with miralax and stool softeners but that has not helped. She has not had a bowel movement in 1 week. She denies any hematochezia or melena. Her abdomen is distended but no sharp pains. Nothing has changed except she was on burst of prednisone. She has now finished. linzess worked well in the past. No fever, chills, body aches, nausea.   She needs refill of cymbalta. COVID pandemic has been hard at times but mood is stable.   .. Active Ambulatory Problems    Diagnosis Date Noted  . Hypothyroidism 05/16/2014  . Anxiety disorder 05/16/2014  . Aortic heart valve narrowing 04/12/2013  . Arthropathia 07/10/2008  . Leg pain 12/04/2012  . DDD (degenerative disc disease) 05/16/2014  . Anxiety and depression 05/16/2014  . Gastric catarrh 08/30/2013  . Acid reflux 05/16/2014  . HLD (hyperlipidemia) 05/16/2014  . Arteriosclerosis of coronary artery 08/30/2013  . OP (osteoporosis) 05/16/2014  . Major depressive disorder, recurrent episode (HCC) 05/16/2014  . COPD, moderate (HCC) 09/25/2014  . Stricture and stenosis of esophagus 10/23/2014  . Aortic valve  replaced 10/23/2014  . Constipation 12/17/2014  . Complication of surgery 11/13/2015  . Left foot pain 02/05/2016  . Failure to attend appointment 05/05/2016  . Diarrhea 08/01/2016  . Multiple gastric ulcers 09/05/2016  . Collagenous colitis 09/09/2016  . Right shoulder pain 11/23/2016  . Left shoulder pain 11/23/2016  . Trochanteric bursitis of both hips 12/07/2016  . Primary insomnia 05/16/2017  . Chronic prescription benzodiazepine use 07/04/2017  . Rash and nonspecific skin eruption 07/04/2017  . Left carotid bruit 08/15/2017  . Actinic keratoses 12/03/2017  . Pruritus 02/21/2018  . Ankle ligament laxity, right 05/28/2018  . Eczema 05/30/2018  . Palpable mass of lower back 05/31/2018  . Chronic bilateral low back pain without sciatica 05/31/2018  . Anxiety 05/31/2018  . Easy bruising 09/12/2018   Resolved Ambulatory Problems    Diagnosis Date Noted  . No Resolved Ambulatory Problems   Past Medical History:  Diagnosis Date  . Arthritis   . Heart disease   . Hyperlipidemia   . Hypertension   . PUD (peptic ulcer disease)   . Thyroid disease    Reviewed med, allergy, problem list.     Observations/Objective: NO acute distress.  No labored breathing.  Normal mood.   .. Today's Vitals   01/08/19 1040  BP: 103/64  Pulse: 72  Weight: 124 lb (56.2 kg)  Height: 5\' 4"  (1.626 m)   Body mass index is 21.28 kg/m.  .. Depression screen [facility] 2/9 01/08/2019 09/03/2018 06/25/2018 11/27/2017 08/15/2017  Decreased Interest 3 3 3 3 3   Down, Depressed, Hopeless 2 3 2  3 3  PHQ - 2 Score 5 6 5 6 6   Altered sleeping 3 3 2 3 3   Tired, decreased energy 1 0 3 3 3   Change in appetite 1 2 2 3 3   Feeling bad or failure about yourself  0 0 0 0 3  Trouble concentrating 2 3 3 3 3   Moving slowly or fidgety/restless 0 1 1 0 0  Suicidal thoughts 0 0 0 0 0  PHQ-9 Score 12 15 16 18 21   Difficult doing work/chores Somewhat difficult Very difficult Extremely dIfficult Extremely dIfficult  Very difficult   .[name] GAD 7 : Generalized Anxiety Score 01/08/2019 09/03/2018 06/25/2018 11/27/2017  Nervous, Anxious, on Edge 3 2 3 3   Control/stop worrying 3 2 3 2   Worry too much - different things 3 2 3 2   Trouble relaxing 3 2 3 3   Restless 0 2 3 0  Easily annoyed or irritable 0 1 0 0  Afraid - awful might happen 3 1 3  0  Total GAD 7 Score 15 12 18 10   Anxiety Difficulty Not difficult at all Somewhat difficult Extremely difficult Extremely difficult      Assessment and Plan: Marland KitchenMarland KitchenViolet was seen today for constipation.  Diagnoses and all orders for this visit:  Constipation, unspecified constipation type -     linaclotide (LINZESS) 145 MCG CAPS capsule; Take 1 capsule (145 mcg total) by mouth daily before breakfast.  Moderate episode of recurrent major depressive disorder (HCC) -     DULoxetine (CYMBALTA) 60 MG capsule; TAKE 2 CAPSULES(120 MG) BY MOUTH DAILY   Could be prednisone induced constipation on her already chronic constipation. Sent linzess. Discussed how to use and side effects. Discussed best results if used daily or even every other day to keep bowels moving. Follow up as needed. Continue to stay hydrated.   cymbalta refilled.     Follow Up Instructions:    I discussed the assessment and treatment plan with the patient. The patient was provided an opportunity to ask questions and all were answered. The patient agreed with the plan and demonstrated an understanding of the instructions.   The patient was advised to call back or seek an in-person evaluation if the symptoms worsen or if the condition fails to improve as anticipated.  I provided 22 minutes of non-face-to-face time during this encounter.   Tandy Gaw, PA-C

## 2019-01-08 NOTE — Progress Notes (Deleted)
-  no changes, requested Linzess  -last BM a week ago  -not tried anything OTC, not drinking much water  -no stomach tenderness, always feels like she needs to have a BM but nothing comes out   -finished round of prednisone last Wednesday, possibly stopped her up?  -PHQ/GAD done

## 2019-01-08 NOTE — Telephone Encounter (Signed)
Spoke with patient. She states no abdominal pain. No BM since last Wednesday. When I asked what has she tried to help (stool softeners, miralax, etc) she states she has tried all that in the past but has tried nothing but eating raisin bran every night because that usually helps. She states she usually doesn't have issues, so I tried to advise that Linzess is an every day medication and a short term solution might be a better option. She isn't interested in advise on constipation. She stated she has had constipation her entire life and if she has to take a pill everyday then she would be willing to do it. She isn't drinking water, just drinking a lot of coffee.  Please advise.

## 2019-01-08 NOTE — Telephone Encounter (Signed)
Can you put her on my 11:10 so I can talk to her about linzess

## 2019-01-15 ENCOUNTER — Other Ambulatory Visit: Payer: Self-pay | Admitting: Physician Assistant

## 2019-01-15 DIAGNOSIS — F419 Anxiety disorder, unspecified: Secondary | ICD-10-CM

## 2019-01-15 DIAGNOSIS — F5101 Primary insomnia: Secondary | ICD-10-CM

## 2019-01-16 ENCOUNTER — Other Ambulatory Visit: Payer: Self-pay | Admitting: Physician Assistant

## 2019-01-16 DIAGNOSIS — F419 Anxiety disorder, unspecified: Secondary | ICD-10-CM

## 2019-01-16 MED ORDER — ALPRAZOLAM 1 MG PO TABS: 1.0000 mg | ORAL_TABLET | Freq: Two times a day (BID) | ORAL | 5 refills | Status: DC

## 2019-01-22 ENCOUNTER — Other Ambulatory Visit: Payer: Self-pay | Admitting: Neurology

## 2019-01-22 MED ORDER — GABAPENTIN 600 MG PO TABS: ORAL_TABLET | ORAL | 1 refills | Status: DC

## 2019-01-22 MED ORDER — PANTOPRAZOLE SODIUM 40 MG PO TBEC: 40.0000 mg | DELAYED_RELEASE_TABLET | Freq: Every day | ORAL | 1 refills | Status: DC

## 2019-01-22 NOTE — Telephone Encounter (Signed)
Patient called and left vm. She needs refill on Gabapentin and Protonix. Just seen for constipation related to pain medication. Okay to refill?   Also asked for 90 day supply for Synthroid, but I advised she needed to have TSH drawn. She has 30 day supply available. She will have lab drawn today or tomorrow.

## 2019-01-23 ENCOUNTER — Other Ambulatory Visit: Payer: Self-pay | Admitting: Physician Assistant

## 2019-01-23 NOTE — Telephone Encounter (Signed)
Spoke with patient and she states she was previously put on both. I did advise she doesn't need to be on both and that Protonix is the stronger option. She will continue Protonix.

## 2019-01-23 NOTE — Telephone Encounter (Signed)
Protonix just refilled. Do you want her to also continue Omeprazole?

## 2019-01-23 NOTE — Telephone Encounter (Signed)
Call patient and confirm she only needs to be on one PPI. protonix is usually a stronger option.

## 2019-02-19 ENCOUNTER — Other Ambulatory Visit: Payer: Self-pay

## 2019-02-19 DIAGNOSIS — E785 Hyperlipidemia, unspecified: Secondary | ICD-10-CM

## 2019-02-19 DIAGNOSIS — E039 Hypothyroidism, unspecified: Secondary | ICD-10-CM

## 2019-02-22 LAB — CBC WITH DIFFERENTIAL/PLATELET
Absolute Monocytes: 419 cells/uL (ref 200–950)
Basophils Absolute: 41 cells/uL (ref 0–200)
Basophils Relative: 0.7 %
Eosinophils Absolute: 319 cells/uL (ref 15–500)
Eosinophils Relative: 5.4 %
HCT: 41.9 % (ref 35.0–45.0)
Hemoglobin: 13.5 g/dL (ref 11.7–15.5)
Lymphs Abs: 1658 cells/uL (ref 850–3900)
MCH: 27 pg (ref 27.0–33.0)
MCHC: 32.2 g/dL (ref 32.0–36.0)
MCV: 83.8 fL (ref 80.0–100.0)
MPV: 9.9 fL (ref 7.5–12.5)
Monocytes Relative: 7.1 %
Neutro Abs: 3463 cells/uL (ref 1500–7800)
Neutrophils Relative %: 58.7 %
Platelets: 267 10*3/uL (ref 140–400)
RBC: 5 10*6/uL (ref 3.80–5.10)
RDW: 13.4 % (ref 11.0–15.0)
Total Lymphocyte: 28.1 %
WBC: 5.9 10*3/uL (ref 3.8–10.8)

## 2019-02-22 LAB — COMPLETE METABOLIC PANEL WITH GFR
AG Ratio: 1.6 (calc) (ref 1.0–2.5)
ALT: 7 U/L (ref 6–29)
AST: 13 U/L (ref 10–35)
Albumin: 4.2 g/dL (ref 3.6–5.1)
Alkaline phosphatase (APISO): 78 U/L (ref 37–153)
BUN: 12 mg/dL (ref 7–25)
CO2: 33 mmol/L — ABNORMAL HIGH (ref 20–32)
Calcium: 9.2 mg/dL (ref 8.6–10.4)
Chloride: 99 mmol/L (ref 98–110)
Creat: 0.91 mg/dL (ref 0.50–0.99)
GFR, Est African American: 77 mL/min/{1.73_m2} (ref >=60)
GFR, Est Non African American: 67 mL/min/{1.73_m2} (ref >=60)
Globulin: 2.7 g/dL (calc) (ref 1.9–3.7)
Glucose, Bld: 90 mg/dL (ref 65–99)
Potassium: 4.2 mmol/L (ref 3.5–5.3)
Sodium: 141 mmol/L (ref 135–146)
Total Bilirubin: 0.5 mg/dL (ref 0.2–1.2)
Total Protein: 6.9 g/dL (ref 6.1–8.1)

## 2019-02-22 LAB — LIPID PANEL W/REFLEX DIRECT LDL
Cholesterol: 257 mg/dL — ABNORMAL HIGH (ref <200)
HDL: 59 mg/dL (ref >=50)
LDL Cholesterol (Calc): 174 mg/dL (calc) — ABNORMAL HIGH
Non-HDL Cholesterol (Calc): 198 mg/dL (calc) — ABNORMAL HIGH (ref <130)
Total CHOL/HDL Ratio: 4.4 (calc) (ref <5.0)
Triglycerides: 114 mg/dL (ref <150)

## 2019-02-22 LAB — TSH: TSH: 0.75 mIU/L (ref 0.40–4.50)

## 2019-02-22 NOTE — Progress Notes (Signed)
Start taking every night and recheck in 4-6 months. Ok to send in 1 year for thyroid medications.[name]

## 2019-02-22 NOTE — Progress Notes (Signed)
Call pt: are you taking the pravachol? Your cholesterol is MUCH higher.   Kidney, liver, glucose look good.   Thyroid looks fine.

## 2019-02-23 ENCOUNTER — Other Ambulatory Visit: Payer: Self-pay | Admitting: Physician Assistant

## 2019-02-23 DIAGNOSIS — E039 Hypothyroidism, unspecified: Secondary | ICD-10-CM

## 2019-02-25 ENCOUNTER — Other Ambulatory Visit: Payer: Self-pay | Admitting: Neurology

## 2019-02-25 DIAGNOSIS — E039 Hypothyroidism, unspecified: Secondary | ICD-10-CM

## 2019-02-25 MED ORDER — LEVOTHYROXINE SODIUM 75 MCG PO TABS: 75.0000 ug | ORAL_TABLET | Freq: Every day | ORAL | 3 refills | Status: DC

## 2019-03-07 ENCOUNTER — Other Ambulatory Visit: Payer: Self-pay | Admitting: Physician Assistant

## 2019-03-26 ENCOUNTER — Other Ambulatory Visit: Payer: Self-pay | Admitting: Physician Assistant

## 2019-03-26 NOTE — Telephone Encounter (Signed)
Patient of Iveliz Garay's.  Last filled 12/10/2018 #90 with 2 refills.  Please advise.

## 2019-04-01 ENCOUNTER — Ambulatory Visit (INDEPENDENT_AMBULATORY_CARE_PROVIDER_SITE_OTHER): Payer: Medicaid Other | Admitting: Physician Assistant

## 2019-04-01 VITALS — BP 116/64 | HR 59 | Ht 64.0 in | Wt 137.0 lb

## 2019-04-01 DIAGNOSIS — M5136 Other intervertebral disc degeneration, lumbar region: Secondary | ICD-10-CM

## 2019-04-01 DIAGNOSIS — M545 Low back pain, unspecified: Secondary | ICD-10-CM

## 2019-04-01 DIAGNOSIS — L570 Actinic keratosis: Secondary | ICD-10-CM

## 2019-04-01 DIAGNOSIS — L309 Dermatitis, unspecified: Secondary | ICD-10-CM

## 2019-04-01 DIAGNOSIS — R29898 Other symptoms and signs involving the musculoskeletal system: Secondary | ICD-10-CM

## 2019-04-01 DIAGNOSIS — G8929 Other chronic pain: Secondary | ICD-10-CM

## 2019-04-01 MED ORDER — MUPIROCIN 2 % EX OINT: TOPICAL_OINTMENT | CUTANEOUS | 1 refills | Status: DC

## 2019-04-01 MED ORDER — ADJUST BATH/SHOWER SEAT MISC: 1.0000 | Freq: Every day | 0 refills | Status: DC

## 2019-04-01 MED ORDER — TRIAMCINOLONE 0.1 % CREAM:EUCERIN CREAM 1:1: 1.0000 "application " | TOPICAL_CREAM | Freq: Two times a day (BID) | CUTANEOUS | 3 refills | Status: DC | PRN

## 2019-04-01 NOTE — Progress Notes (Signed)
Patient ID: Rachael Douglas, female   DOB: 1956/01/29, 63 y.o.   MRN: [id number] .Marland KitchenVirtual Visit via Telephone Note  I connected with Rachael Douglas on 04/01/19 at  3:20 PM EDT by telephone and verified that I am speaking with the correct person using two identifiers.  Location: Patient: home Provider: clinic   I discussed the limitations, risks, security and privacy concerns of performing an evaluation and management service by telephone and the availability of in person appointments. I also discussed with the patient that there may be a patient responsible charge related to this service. The patient expressed understanding and agreed to proceed.   History of Present Illness: Pt is a 63 yo female with hypothyroidism, eczema, AK's, anxiety and chronic bilateral low back pain she calls in for refills and to discuss imaging for back.   Pt sees Dr. Kriste Basque for chronic low back pain. She states Selinda Orion would like another MRI of her back. She has not had one in years. Her pain continues to be daily. Pain medication does help. She did fall after both legs gave out on her the other day. She does feel like she would benefit from wheelchair and seat for the shower. She denies any new pains. No bowel or bladder dysfunction. No saddle anesthesia. Her legs do overall feel weaker and give out more.   She needs refills on her creams.   .. Active Ambulatory Problems    Diagnosis Date Noted  . Hypothyroidism 05/16/2014  . Anxiety disorder 05/16/2014  . Aortic heart valve narrowing 04/12/2013  . Arthropathia 07/10/2008  . Leg pain 12/04/2012  . DDD (degenerative disc disease) 05/16/2014  . Anxiety and depression 05/16/2014  . Gastric catarrh 08/30/2013  . Acid reflux 05/16/2014  . HLD (hyperlipidemia) 05/16/2014  . Arteriosclerosis of coronary artery 08/30/2013  . OP (osteoporosis) 05/16/2014  . Major depressive disorder, recurrent episode ([place]) 05/16/2014  . COPD, moderate ([place]) 09/25/2014  .  Stricture and stenosis of esophagus 10/23/2014  . Aortic valve replaced 10/23/2014  . Constipation 12/17/2014  . Complication of surgery 40/98/1191  . Left foot pain 02/05/2016  . Failure to attend appointment 05/05/2016  . Diarrhea 08/01/2016  . Multiple gastric ulcers 09/05/2016  . Collagenous colitis 09/09/2016  . Right shoulder pain 11/23/2016  . Left shoulder pain 11/23/2016  . Trochanteric bursitis of both hips 12/07/2016  . Primary insomnia 05/16/2017  . Chronic prescription benzodiazepine use 07/04/2017  . Rash and nonspecific skin eruption 07/04/2017  . Left carotid bruit 08/15/2017  . Actinic keratoses 12/03/2017  . Pruritus 02/21/2018  . Ankle ligament laxity, right 05/28/2018  . Eczema 05/30/2018  . Palpable mass of lower back 05/31/2018  . Chronic bilateral low back pain without sciatica 05/31/2018  . Anxiety 05/31/2018  . Easy bruising 09/12/2018   Resolved Ambulatory Problems    Diagnosis Date Noted  . No Resolved Ambulatory Problems   Past Medical History:  Diagnosis Date  . Arthritis   . Heart disease   . Hyperlipidemia   . Hypertension   . PUD (peptic ulcer disease)   . Thyroid disease    Reviewed med, allergy, problem list.     Observations/Objective: No acute distress. Normal mood.   .. Today's Vitals   04/01/19 1515  BP: 116/64  Pulse: (!) 59  Weight: 137 lb (62.1 kg)  Height: 5\' 4"  (1.626 m)   Body mass index is 23.52 kg/m.    Assessment and Plan: Marland KitchenMarland KitchenViolet was seen today for back pain and ankle  pain.  Diagnoses and all orders for this visit:  Chronic bilateral low back pain without sciatica -     Misc. Devices (ADJUST BATH/SHOWER SEAT) MISC; 1 Device by Does not apply route daily. DX: M54.5, G89.29 -     DME Wheelchair manual  Actinic keratoses -     mupirocin ointment (BACTROBAN) 2 %; Apply to affected area TID for 7 days.  Eczema, unspecified type -     Triamcinolone Acetonide (TRIAMCINOLONE 0.1 % CREAM : EUCERIN) CREA;  Apply 1 application topically 2 (two) times daily as needed.  will order MRI due to chronic pain. No MRI since 2000. Her legs are progressively getting weaker. See pain clinic for management.   Refilled creams.    Follow Up Instructions:    I discussed the assessment and treatment plan with the patient. The patient was provided an opportunity to ask questions and all were answered. The patient agreed with the plan and demonstrated an understanding of the instructions.   The patient was advised to call back or seek an in-person evaluation if the symptoms worsen or if the condition fails to improve as anticipated.  I provided 11 minutes of non-face-to-face time during this encounter.   Tandy GawJade Breeback, PA-C

## 2019-04-01 NOTE — Progress Notes (Signed)
Patient having a lot of pain in back and ankles, she states pain doctor wanted her to have MR but wanted her to speak with you?  Also requesting wheelchair and shower seat. Needs refills of bactroban and triamcinolone creams. She states areas on arm have come back and needs bactroban.

## 2019-04-02 ENCOUNTER — Other Ambulatory Visit: Payer: Self-pay

## 2019-04-02 ENCOUNTER — Ambulatory Visit (INDEPENDENT_AMBULATORY_CARE_PROVIDER_SITE_OTHER): Payer: Medicaid Other | Admitting: Physician Assistant

## 2019-04-02 ENCOUNTER — Encounter: Payer: Self-pay | Admitting: Physician Assistant

## 2019-04-02 VITALS — BP 133/62 | HR 64 | Temp 98.5°F | Ht 64.0 in | Wt 139.0 lb

## 2019-04-02 DIAGNOSIS — R3911 Hesitancy of micturition: Secondary | ICD-10-CM

## 2019-04-02 DIAGNOSIS — R8290 Unspecified abnormal findings in urine: Secondary | ICD-10-CM | POA: Diagnosis not present

## 2019-04-02 DIAGNOSIS — E876 Hypokalemia: Secondary | ICD-10-CM

## 2019-04-02 DIAGNOSIS — F5101 Primary insomnia: Secondary | ICD-10-CM | POA: Diagnosis not present

## 2019-04-02 DIAGNOSIS — L309 Dermatitis, unspecified: Secondary | ICD-10-CM

## 2019-04-02 DIAGNOSIS — N3001 Acute cystitis with hematuria: Secondary | ICD-10-CM

## 2019-04-02 DIAGNOSIS — K219 Gastro-esophageal reflux disease without esophagitis: Secondary | ICD-10-CM

## 2019-04-02 LAB — POCT URINALYSIS DIPSTICK
Bilirubin, UA: NEGATIVE
Glucose, UA: NEGATIVE
Ketones, UA: NEGATIVE
Nitrite, UA: NEGATIVE
Protein, UA: NEGATIVE
Spec Grav, UA: 1.01 (ref 1.010–1.025)
Urobilinogen, UA: 0.2 E.U./dL
pH, UA: 5.5 (ref 5.0–8.0)

## 2019-04-02 MED ORDER — PANTOPRAZOLE SODIUM 40 MG PO TBEC: 40.0000 mg | DELAYED_RELEASE_TABLET | Freq: Every day | ORAL | 1 refills | Status: DC

## 2019-04-02 MED ORDER — SULFAMETHOXAZOLE-TRIMETHOPRIM 800-160 MG PO TABS: 1.0000 | ORAL_TABLET | Freq: Two times a day (BID) | ORAL | 0 refills | Status: DC

## 2019-04-02 MED ORDER — ZOLPIDEM TARTRATE 10 MG PO TABS: 10.0000 mg | ORAL_TABLET | Freq: Every evening | ORAL | 5 refills | Status: DC | PRN

## 2019-04-02 MED ORDER — POTASSIUM 75 MG PO TABS: 2.0000 | ORAL_TABLET | ORAL | 4 refills | Status: DC

## 2019-04-02 NOTE — Patient Instructions (Signed)
Urinary Tract Infection, Adult A urinary tract infection (UTI) is an infection of any part of the urinary tract. The urinary tract includes:  The kidneys.  The ureters.  The bladder.  The urethra. These organs make, store, and get rid of pee (urine) in the body. What are the causes? This is caused by germs (bacteria) in your genital area. These germs grow and cause swelling (inflammation) of your urinary tract. What increases the risk? You are more likely to develop this condition if:  You have a small, thin tube (catheter) to drain pee.  You cannot control when you pee or poop (incontinence).  You are female, and: ? You use these methods to prevent pregnancy: ? A medicine that kills sperm (spermicide). ? A device that blocks sperm (diaphragm). ? You have low levels of a female hormone (estrogen). ? You are pregnant.  You have genes that add to your risk.  You are sexually active.  You take antibiotic medicines.  You have trouble peeing because of: ? A prostate that is bigger than normal, if you are female. ? A blockage in the part of your body that drains pee from the bladder (urethra). ? A kidney stone. ? A nerve condition that affects your bladder (neurogenic bladder). ? Not getting enough to drink. ? Not peeing often enough.  You have other conditions, such as: ? Diabetes. ? A weak disease-fighting system (immune system). ? Sickle cell disease. ? Gout. ? Injury of the spine. What are the signs or symptoms? Symptoms of this condition include:  Needing to pee right away (urgently).  Peeing often.  Peeing small amounts often.  Pain or burning when peeing.  Blood in the pee.  Pee that smells bad or not like normal.  Trouble peeing.  Pee that is cloudy.  Fluid coming from the vagina, if you are female.  Pain in the belly or lower back. Other symptoms include:  Throwing up (vomiting).  No urge to eat.  Feeling mixed up (confused).  Being tired  and grouchy (irritable).  A fever.  Watery poop (diarrhea). How is this treated? This condition may be treated with:  Antibiotic medicine.  Other medicines.  Drinking enough water. Follow these instructions at home:  Medicines  Take over-the-counter and prescription medicines only as told by your doctor.  If you were prescribed an antibiotic medicine, take it as told by your doctor. Do not stop taking it even if you start to feel better. General instructions  Make sure you: ? Pee until your bladder is empty. ? Do not hold pee for a long time. ? Empty your bladder after sex. ? Wipe from front to back after pooping if you are a female. Use each tissue one time when you wipe.  Drink enough fluid to keep your pee pale yellow.  Keep all follow-up visits as told by your doctor. This is important. Contact a doctor if:  You do not get better after 1-2 days.  Your symptoms go away and then come back. Get help right away if:  You have very bad back pain.  You have very bad pain in your lower belly.  You have a fever.  You are sick to your stomach (nauseous).  You are throwing up. Summary  A urinary tract infection (UTI) is an infection of any part of the urinary tract.  This condition is caused by germs in your genital area.  There are many risk factors for a UTI. These include having a small, thin   tube to drain pee and not being able to control when you pee or poop.  Treatment includes antibiotic medicines for germs.  Drink enough fluid to keep your pee pale yellow. This information is not intended to replace advice given to you by your health care provider. Make sure you discuss any questions you have with your health care provider. Document Released: 02/22/2008 Document Revised: 08/23/2018 Document Reviewed: 03/15/2018 Elsevier Patient Education  2020 Elsevier Inc.  

## 2019-04-02 NOTE — Progress Notes (Signed)
Subjective:    Patient ID: Rachael Douglas, female    DOB: 1955-11-27, 63 y.o.   MRN: [id number]  HPI  Pt is a 63 yo female with hx of UTI who presents to the clinic with urinary odor, hard time urinating, and lower abdominal pressure. No fever, chills, body aches, she has had some nausea. Symptoms for about one week. She has been taking cranberry tablets. Last UTI was Jan 2020.   She needs some medication refill.   She is sleeping well. She denies any problems with acid reflux.   .. Active Ambulatory Problems    Diagnosis Date Noted  . Hypothyroidism 05/16/2014  . Anxiety disorder 05/16/2014  . Aortic heart valve narrowing 04/12/2013  . Arthropathia 07/10/2008  . Leg pain 12/04/2012  . DDD (degenerative disc disease) 05/16/2014  . Anxiety and depression 05/16/2014  . Gastric catarrh 08/30/2013  . Acid reflux 05/16/2014  . HLD (hyperlipidemia) 05/16/2014  . Arteriosclerosis of coronary artery 08/30/2013  . OP (osteoporosis) 05/16/2014  . Major depressive disorder, recurrent episode ([place]) 05/16/2014  . COPD, moderate ([place]) 09/25/2014  . Stricture and stenosis of esophagus 10/23/2014  . Aortic valve replaced 10/23/2014  . Constipation 12/17/2014  . Complication of surgery 89/38/1017  . Left foot pain 02/05/2016  . Failure to attend appointment 05/05/2016  . Diarrhea 08/01/2016  . Multiple gastric ulcers 09/05/2016  . Collagenous colitis 09/09/2016  . Right shoulder pain 11/23/2016  . Left shoulder pain 11/23/2016  . Trochanteric bursitis of both hips 12/07/2016  . Primary insomnia 05/16/2017  . Chronic prescription benzodiazepine use 07/04/2017  . Rash and nonspecific skin eruption 07/04/2017  . Left carotid bruit 08/15/2017  . Actinic keratoses 12/03/2017  . Pruritus 02/21/2018  . Ankle ligament laxity, right 05/28/2018  . Eczema 05/30/2018  . Palpable mass of lower back 05/31/2018  . Chronic bilateral low back pain without sciatica 05/31/2018  . Anxiety 05/31/2018   . Easy bruising 09/12/2018   Resolved Ambulatory Problems    Diagnosis Date Noted  . No Resolved Ambulatory Problems   Past Medical History:  Diagnosis Date  . Arthritis   . Heart disease   . Hyperlipidemia   . Hypertension   . PUD (peptic ulcer disease)   . Thyroid disease      Review of Systems    see HPI.  Objective:   Physical Exam Vitals signs reviewed.  Constitutional:      Appearance: Normal appearance.  Cardiovascular:     Rate and Rhythm: Normal rate and regular rhythm.     Heart sounds: Murmur present.  Pulmonary:     Effort: Pulmonary effort is normal.     Breath sounds: Normal breath sounds.  Abdominal:     General: Bowel sounds are normal.     Palpations: Abdomen is soft.     Tenderness: There is no right CVA tenderness or left CVA tenderness.  Neurological:     General: No focal deficit present.     Mental Status: She is alert and oriented to person, place, and time.  Psychiatric:        Mood and Affect: Mood normal.        Behavior: Behavior normal.           Assessment & Plan:  Marland KitchenMarland KitchenViolet was seen today for urinary retention.  Diagnoses and all orders for this visit:  Acute cystitis with hematuria -     sulfamethoxazole-trimethoprim (BACTRIM DS) 800-160 MG tablet; Take 1 tablet by mouth 2 (two) times daily.  Urinary hesitancy -     POCT urinalysis dipstick -     Urine Culture  Bad odor of urine -     POCT urinalysis dipstick -     Urine Culture  Primary insomnia -     zolpidem (AMBIEN) 10 MG tablet; Take 1 tablet (10 mg total) by mouth at bedtime as needed. for sleep  Eczema, unspecified type  Hypokalemia -     Potassium 75 MG TABS; Take 2 tablets (150 mg total) by mouth as directed.  Gastroesophageal reflux disease, esophagitis presence not specified -     pantoprazole (PROTONIX) 40 MG tablet; Take 1 tablet (40 mg total) by mouth daily.    Results for orders placed or performed in visit on 04/02/19  POCT urinalysis  dipstick  Result Value Ref Range   Color, UA yellow    Clarity, UA clear    Glucose, UA Negative Negative   Bilirubin, UA negative    Ketones, UA negative    Spec Grav, UA 1.010 1.010 - 1.025   Blood, UA trace-lysed    pH, UA 5.5 5.0 - 8.0   Protein, UA Negative Negative   Urobilinogen, UA 0.2 0.2 or 1.0 E.U./dL   Nitrite, UA negative    Leukocytes, UA Small (1+) (A) Negative   Appearance     Odor     Treated for UTI with bactrim. Historically worked for her.  Will culture.   Refills given.   Follow up in 6 months.

## 2019-04-04 ENCOUNTER — Telehealth: Payer: Self-pay

## 2019-04-04 DIAGNOSIS — N3001 Acute cystitis with hematuria: Secondary | ICD-10-CM

## 2019-04-04 DIAGNOSIS — E876 Hypokalemia: Secondary | ICD-10-CM

## 2019-04-04 DIAGNOSIS — L570 Actinic keratosis: Secondary | ICD-10-CM

## 2019-04-04 LAB — URINE CULTURE
MICRO NUMBER:: 665163
SPECIMEN QUALITY:: ADEQUATE

## 2019-04-04 NOTE — Telephone Encounter (Signed)
1 to 1 ratio.  She can just get potassium supplement OTC or ask her what her last bottle says for potassium if got at pharmacy.

## 2019-04-04 NOTE — Telephone Encounter (Signed)
Pharmacist advised of ratio.  Left pt msg advising of potassium

## 2019-04-04 NOTE — Telephone Encounter (Signed)
The pharmacy states they don't have Potassium 75 mg. They also are asking what is the ratio for the triamcinolone cream and Eucerin cream and the amount. Please advise.

## 2019-04-05 ENCOUNTER — Encounter: Payer: Self-pay | Admitting: Physician Assistant

## 2019-04-05 NOTE — Progress Notes (Signed)
Culture confirmed E.coli. bactrim should treat.

## 2019-04-10 ENCOUNTER — Telehealth: Payer: Self-pay

## 2019-04-10 NOTE — Telephone Encounter (Signed)
Sounds good. Let patient know to talk to tarsha garvin.

## 2019-04-10 NOTE — Telephone Encounter (Signed)
Pt advised. Will contact Kriste Basque for imaging orders

## 2019-04-10 NOTE — Telephone Encounter (Signed)
Received fax from [place] advising that Medicaid has denied the request for MR Lumbar Spine W/O Contrast. Per [employer], this request was denied because  "guidelines may support imaging in the evaluation of suspected or known spinal disease with one of the following: failure to improve after a recent (within three months) six week trial of physician -guided clinical care with clinical re-evaluation."  I recommend that patient get Dr Kriste Basque to attempt to order and get this imaging approved, since she is managing the pain with pt's back.  Please advise

## 2019-04-19 ENCOUNTER — Other Ambulatory Visit: Payer: Self-pay | Admitting: Physician Assistant

## 2019-04-19 DIAGNOSIS — F5101 Primary insomnia: Secondary | ICD-10-CM

## 2019-04-25 ENCOUNTER — Other Ambulatory Visit: Payer: Self-pay

## 2019-04-25 MED ORDER — METOPROLOL TARTRATE 25 MG PO TABS: 25.0000 mg | ORAL_TABLET | Freq: Two times a day (BID) | ORAL | 1 refills | Status: DC

## 2019-05-02 ENCOUNTER — Telehealth: Payer: Self-pay

## 2019-05-02 DIAGNOSIS — E876 Hypokalemia: Secondary | ICD-10-CM

## 2019-05-02 NOTE — Telephone Encounter (Signed)
Patient called stating she found an old RX bottle of Klor-con 20 meq where she took 1-2 tablets daily, last written 08/15/14   She wanted me to make you aware

## 2019-05-06 MED ORDER — POTASSIUM CHLORIDE ER 10 MEQ PO TBCR: 10.0000 meq | EXTENDED_RELEASE_TABLET | Freq: Every day | ORAL | 1 refills | Status: DC

## 2019-05-06 NOTE — Telephone Encounter (Signed)
Pt advised. Future labs ordered

## 2019-05-06 NOTE — Telephone Encounter (Signed)
That's a good amt of potassium lets start with 14meq daily. We can check potassium in 2 weeks. Potassium getting too high is dangerous as well.

## 2019-05-06 NOTE — Addendum Note (Signed)
Addended by: Towana Badger on: 05/06/2019 02:43 PM   Modules accepted: Orders

## 2019-05-24 ENCOUNTER — Other Ambulatory Visit: Payer: Self-pay | Admitting: Physician Assistant

## 2019-05-24 DIAGNOSIS — E782 Mixed hyperlipidemia: Secondary | ICD-10-CM

## 2019-06-18 ENCOUNTER — Other Ambulatory Visit: Payer: Self-pay | Admitting: Physician Assistant

## 2019-06-20 ENCOUNTER — Other Ambulatory Visit: Payer: Self-pay

## 2019-06-20 DIAGNOSIS — L309 Dermatitis, unspecified: Secondary | ICD-10-CM

## 2019-06-20 MED ORDER — TRIAMCINOLONE 0.1 % CREAM:EUCERIN CREAM 1:1: 1.0000 "application " | TOPICAL_CREAM | Freq: Two times a day (BID) | CUTANEOUS | 3 refills | Status: DC | PRN

## 2019-06-20 MED ORDER — CYCLOBENZAPRINE HCL 10 MG PO TABS: 10.0000 mg | ORAL_TABLET | Freq: Three times a day (TID) | ORAL | 2 refills | Status: DC

## 2019-06-26 ENCOUNTER — Telehealth: Payer: Self-pay

## 2019-06-26 ENCOUNTER — Other Ambulatory Visit: Payer: Self-pay | Admitting: Physician Assistant

## 2019-06-26 NOTE — Telephone Encounter (Signed)
On October 2nd patient dropped off handicapped form for completion.   Form completed and patient's spouse advised, per pt's request.   Copy up front for pick up and copy sent to scan

## 2019-07-01 ENCOUNTER — Telehealth: Payer: Self-pay | Admitting: Neurology

## 2019-07-01 DIAGNOSIS — K5900 Constipation, unspecified: Secondary | ICD-10-CM

## 2019-07-01 MED ORDER — LINACLOTIDE 145 MCG PO CAPS: 145.0000 ug | ORAL_CAPSULE | Freq: Every day | ORAL | 0 refills | Status: DC

## 2019-07-01 NOTE — Telephone Encounter (Signed)
Patient left vm for linzess and protonix refill. Should have protonix refill at pharmacy. Linzess refill sent.

## 2019-07-04 ENCOUNTER — Ambulatory Visit (INDEPENDENT_AMBULATORY_CARE_PROVIDER_SITE_OTHER): Payer: Medicaid Other | Admitting: Family Medicine

## 2019-07-04 ENCOUNTER — Encounter: Payer: Self-pay | Admitting: Family Medicine

## 2019-07-04 ENCOUNTER — Other Ambulatory Visit: Payer: Self-pay

## 2019-07-04 ENCOUNTER — Ambulatory Visit (INDEPENDENT_AMBULATORY_CARE_PROVIDER_SITE_OTHER): Payer: Medicaid Other

## 2019-07-04 VITALS — BP 134/79 | HR 64 | Temp 98.3°F | Wt 133.0 lb

## 2019-07-04 DIAGNOSIS — M25561 Pain in right knee: Secondary | ICD-10-CM | POA: Diagnosis not present

## 2019-07-04 DIAGNOSIS — M25571 Pain in right ankle and joints of right foot: Secondary | ICD-10-CM | POA: Diagnosis not present

## 2019-07-04 DIAGNOSIS — M5416 Radiculopathy, lumbar region: Secondary | ICD-10-CM | POA: Diagnosis not present

## 2019-07-04 MED ORDER — PREDNISONE 50 MG PO TABS: 50.0000 mg | ORAL_TABLET | Freq: Every day | ORAL | 0 refills | Status: DC

## 2019-07-04 NOTE — Patient Instructions (Addendum)
Thank you for coming in today. Call or go to the ER if you develop a large red swollen joint with extreme pain or oozing puss.  Recheck as needed.  Plan for MRI low back.  Let me know if the knee does not settle down.   Start prednisone course on Saturday.

## 2019-07-04 NOTE — Progress Notes (Signed)
Rachael Douglas is a 63 y.o. female who presents to [facility] today for right knee and ankle pain.  Patient has a several month history of worsening pain into the right knee and right ankle.  She notes it worsened recently bring her into clinic today.  She denies any injury.  Next She has the right knee becomes painful and swollen with more activity throughout the day.  She denies locking but does note sometimes she feels that her knee may give way on her.  She does use a walker at times to ambulate.  Additionally she notes pain into the anterior aspect of her right ankle worse with activity.  She denies significant ankle swelling or injury.  She receives regular care from her pain management specialist.  She thinks her pain management specialist may be worried about " a pinched nerve in my back" causing leg pain and is considering an MRI     ROS:  As above  Exam:  BP 134/79   Pulse 64   Temp 98.3 F (36.8 C) (Oral)   Wt 133 lb (60.3 kg)   BMI 22.83 kg/m  Wt Readings from Last 5 Encounters:  07/04/19 133 lb (60.3 kg)  04/02/19 139 lb (63 kg)  04/01/19 137 lb (62.1 kg)  01/08/19 124 lb (56.2 kg)  09/25/18 128 lb (58.1 kg)   General: Well Developed, well nourished, and in no acute distress.  Neuro/Psych: Alert and oriented x3, extra-ocular muscles intact, able to move all 4 extremities, sensation grossly intact. Skin: Warm and dry, no rashes noted.  Respiratory: Not using accessory muscles, speaking in full sentences, trachea midline.  Cardiovascular: Pulses palpable, no extremity edema. Abdomen: Does not appear distended. MSK:  Right knee: Mild effusion no significant deformity. Range of motion 5-110 degrees with mild crepitation.   Diffusely tender to palpation.   Stable ligament exam.  Right ankle relatively normal-appearing with no significant effusion or deformity. Not particularly tender. Decreased range of motion.   Stable given his exam.   Lab and Radiology Results X-ray images obtained today personally independently reviewed. Right knee: Moderate degenerative changes no acute fracture.  Right ankle: Minimal degenerative changes.  No acute fracture.  Await formal radiology review  Procedure: Real-time Ultrasound Guided Injection of right knee Device: GE Logiq E   Images permanently stored and available for review in the ultrasound unit. Verbal informed consent obtained.  Discussed risks and benefits of procedure. Warned about infection bleeding damage to structures skin hypopigmentation and fat atrophy among others. Patient expresses understanding and agreement Time-out conducted.   Noted no overlying erythema, induration, or other signs of local infection.   Skin prepped in a sterile fashion.   Local anesthesia: Topical Ethyl chloride.   With sterile technique and under real time ultrasound guidance:  40 mg of Kenalog and 3 mL of Marcaine injected easily.   Completed without difficulty   Pain immediately resolved suggesting accurate placement of the medication.   Advised to call if fevers/chills, erythema, induration, drainage, or persistent bleeding.   Images permanently stored and available for review in the ultrasound unit.  Impression: Technically successful ultrasound guided injection.       Assessment and Plan: 63 y.o. female with right knee and leg pain.  Patient likely has multifactorial pain.  She is having knee pain and effusion therefore I do think she has an articular problem rather than lumbar radicular cause of the knee pain. Plan to proceed with injection and  diclofenac gel.  Recheck in the near future if not improving  However patient also has lower leg and ankle pain on the right side.  She has a history of spinal stenosis and is currently receiving frequent injections via pain management.  I believe her right lower leg and ankle pain is likely L5 radiculopathy.  Discussed  options.  Plan to use a trial of oral prednisone and follow back up with Dr. Oneal GroutGarvin her pain management specialist.  Will start prednisone in 2 days which should allow her blood sugar to return to normal after the intra-articular injection as above today.   PDMP not reviewed this encounter. Orders Placed This Encounter  Procedures  . DG Knee Complete 4 Views Right    Please include patellar sunrise, lateral, and weightbearing bilateral AP and bilateral rosenberg views    Standing Status:   Future    Number of Occurrences:   1    Standing Expiration Date:   09/02/2020    Order Specific Question:   Reason for exam:    Answer:   Please include patellar sunrise, lateral, and weightbearing bilateral AP and bilateral rosenberg views    Comments:   Please include patellar sunrise, lateral, and weightbearing bilateral AP and bilateral rosenberg views    Order Specific Question:   Preferred imaging location?    [facility]  . DG Knee 1-2 Views Left    Standing Status:   Future    Number of Occurrences:   1    Standing Expiration Date:   09/03/2020    Order Specific Question:   Reason for Exam (SYMPTOM  OR DIAGNOSIS REQUIRED)    Answer:   For use with right knee x-ray, bilateral AP and Rosenberg standing.    Order Specific Question:   Preferred imaging location?    Answer[facility]  . DG Ankle Complete Right    Standing Status:   Future    Number of Occurrences:   1    Standing Expiration Date:   09/02/2020    Order Specific Question:   Preferred imaging location?    Answer[facility]    Order Specific Question:   Reason for exam:    Answer:   eval anterior ankle pain    Comments:   Please include AP, Lateral, and mortise views.  . MR Lumbar Spine Wo Contrast    Standing Status:   Future    Standing Expiration Date:   09/02/2020    Order Specific Question:   What is the patient's sedation requirement?    Answer:   No Sedation    Order  Specific Question:   Does the patient have a pacemaker or implanted devices?    Answer:   No    Order Specific Question:   Preferred imaging location?    Answer[facility]table limit-350lbs[facility]    Order Specific Question:   Radiology Contrast Protocol - do NOT remove file path    Answer:   [email or web address]   Meds ordered this encounter  Medications  . predniSONE (DELTASONE) 50 MG tablet    Sig: Take 1 tablet (50 mg total) by mouth daily.    Dispense:  5 tablet    Refill:  0    Historical information moved to improve visibility of documentation.  Past Medical History:  Diagnosis Date  . Arthritis   . Heart disease   . Hyperlipidemia   . Hypertension   . PUD (  peptic ulcer disease)   . Thyroid disease    Past Surgical History:  Procedure Laterality Date  . AORTIC VALVE REPLACEMENT    . TOTAL ABDOMINAL HYSTERECTOMY     Social History   Tobacco Use  . Smoking status: Current Every Day Smoker    Types: E-cigarettes    Last attempt to quit: 05/30/2013    Years since quitting: 6.1  . Smokeless tobacco: Never Used  Substance Use Topics  . Alcohol use: No    Alcohol/week: 0.0 standard drinks   family history includes Breast cancer in an other family member; Cancer in her unknown relative; Depression in her unknown relative; Diabetes in her unknown relative; Hypertension in her unknown relative.  Medications: Current Outpatient Medications  Medication Sig Dispense Refill  . albuterol (PROAIR HFA) 108 (90 BASE) MCG/ACT inhaler INHALE 2 PUFFS EVERY 6 HOURS AS NEEDED    . ALPRAZolam (XANAX) 1 MG tablet Take 1 tablet (1 mg total) by mouth 2 (two) times daily. 60 tablet 5  . aspirin EC 81 MG tablet Take 1 tablet (81 mg total) by mouth daily.    . clobetasol cream (TEMOVATE) 0.05 % Apply 1 application topically 2 (two) times daily. 15 g 0  . cyclobenzaprine (FLEXERIL) 10 MG tablet Take 1 tablet (10 mg total) by mouth 3 (three) times daily. 90  tablet 2  . DULoxetine (CYMBALTA) 60 MG capsule TAKE 2 CAPSULES(120 MG) BY MOUTH DAILY 60 capsule 5  . furosemide (LASIX) 40 MG tablet TAKE 1 TABLET(40 MG) BY MOUTH DAILY AS NEEDED FOR SWELLING 90 tablet 0  . gabapentin (NEURONTIN) 600 MG tablet TAKE 2 TABLETS(1200 MG) BY MOUTH THREE TIMES DAILY 540 tablet 1  . HYDROmorphone (DILAUDID) 2 MG tablet TK 1 T PO Q 8 H PRN    . hydrOXYzine (ATARAX/VISTARIL) 50 MG tablet TAKE 1 TABLET(50 MG) BY MOUTH EVERY 8 HOURS AS NEEDED FOR ITCHING 60 tablet 0  . levothyroxine (SYNTHROID) 75 MCG tablet Take 1 tablet (75 mcg total) by mouth daily before breakfast. 90 tablet 3  . linaclotide (LINZESS) 145 MCG CAPS capsule Take 1 capsule (145 mcg total) by mouth daily before breakfast. 90 capsule 0  . metoprolol tartrate (LOPRESSOR) 25 MG tablet Take 1 tablet (25 mg total) by mouth 2 (two) times daily. 180 tablet 1  . Misc. Devices (ADJUST BATH/SHOWER SEAT) MISC 1 Device by Does not apply route daily. DX: M54.5, G89.29 1 each 0  . mometasone-formoterol (DULERA) 200-5 MCG/ACT AERO Inhale 1 puff into the lungs daily. 3 Inhaler 3  . mupirocin ointment (BACTROBAN) 2 % Apply to affected area TID for 7 days. 60 g 1  . Omega 3 1200 MG CAPS Take by mouth.    . pantoprazole (PROTONIX) 40 MG tablet Take 1 tablet (40 mg total) by mouth daily. 90 tablet 1  . Potassium 75 MG TABS Take 2 tablets (150 mg total) by mouth as directed. 180 tablet 4  . potassium chloride (KLOR-CON 10) 10 MEQ tablet Take 1 tablet (10 mEq total) by mouth daily. 90 tablet 1  . pravastatin (PRAVACHOL) 80 MG tablet TAKE 1 TABLET(80 MG) BY MOUTH DAILY 90 tablet 3  . sulfamethoxazole-trimethoprim (BACTRIM DS) 800-160 MG tablet Take 1 tablet by mouth 2 (two) times daily. 14 tablet 0  . traZODone (DESYREL) 50 MG tablet TAKE 1 TO 2 TABLETS BY MOUTH EVERY NIGHT 1 HOUR BEFORE BEDTIME 60 tablet 2  . triamcinolone (KENALOG) 0.1 % paste Place thin layer on mouth ulcers after meals BID  to TID.  May use 5 to 7 days. 10 g  1  . Triamcinolone Acetonide (TRIAMCINOLONE 0.1 % CREAM : EUCERIN) CREA Apply 1 application topically 2 (two) times daily as needed. 1 each 3  . triamcinolone cream (KENALOG) 0.1 % APPLY EXTERNALLY TO THE AFFECTED AREA TWICE DAILY. AVOID FACE 80 g 0  . Vitamin D, Ergocalciferol, (DRISDOL) 1.25 MG (50000 UT) CAPS capsule TAKE 1 CAPSULE BY MOUTH EVERY 7 DAYS 12 capsule 0  . zolpidem (AMBIEN) 10 MG tablet Take 1 tablet (10 mg total) by mouth at bedtime as needed. for sleep 30 tablet 5  . predniSONE (DELTASONE) 50 MG tablet Take 1 tablet (50 mg total) by mouth daily. 5 tablet 0   No current facility-administered medications for this visit.    Allergies  Allergen Reactions  . Penicillins Swelling  . Ranitidine Other (See Comments)    Facial swelling  . Alendronate Other (See Comments)    unknown  . Bisphosphonates     Nausea/stomach pains.       Discussed warning signs or symptoms. Please see discharge instructions. Patient expresses understanding.

## 2019-07-09 ENCOUNTER — Encounter: Payer: Self-pay | Admitting: Physician Assistant

## 2019-07-09 ENCOUNTER — Other Ambulatory Visit: Payer: Self-pay

## 2019-07-09 ENCOUNTER — Ambulatory Visit (INDEPENDENT_AMBULATORY_CARE_PROVIDER_SITE_OTHER): Payer: Medicaid Other

## 2019-07-09 ENCOUNTER — Ambulatory Visit (INDEPENDENT_AMBULATORY_CARE_PROVIDER_SITE_OTHER): Payer: Medicaid Other | Admitting: Physician Assistant

## 2019-07-09 VITALS — BP 118/71 | HR 67 | Temp 98.2°F | Ht 64.0 in | Wt 133.0 lb

## 2019-07-09 DIAGNOSIS — R071 Chest pain on breathing: Secondary | ICD-10-CM | POA: Diagnosis not present

## 2019-07-09 DIAGNOSIS — M940 Chondrocostal junction syndrome [Tietze]: Secondary | ICD-10-CM

## 2019-07-09 DIAGNOSIS — Z23 Encounter for immunization: Secondary | ICD-10-CM

## 2019-07-09 MED ORDER — DICLOFENAC SODIUM 1 % TD GEL: 4.0000 g | Freq: Four times a day (QID) | TRANSDERMAL | 1 refills | Status: DC

## 2019-07-09 MED ORDER — KETOROLAC TROMETHAMINE 60 MG/2ML IM SOLN
60.0000 mg | Freq: Once | INTRAMUSCULAR | Status: AC
  Administered 2019-07-09: 60 mg via INTRAMUSCULAR

## 2019-07-09 NOTE — Patient Instructions (Addendum)
Costochondritis Costochondritis is swelling and irritation (inflammation) of the tissue (cartilage) that connects your ribs to your breastbone (sternum). This causes pain in the front of your chest. Usually, the pain:  Starts gradually.  Is in more than one rib. This condition usually goes away on its own over time. Follow these instructions at home:  Do not do anything that makes your pain worse.  If directed, put ice on the painful area: ? Put ice in a plastic bag. ? Place a towel between your skin and the bag. ? Leave the ice on for 20 minutes, 2-3 times a day.  If directed, put heat on the affected area as often as told by your doctor. Use the heat source that your doctor tells you to use, such as a moist heat pack or a heating pad. ? Place a towel between your skin and the heat source. ? Leave the heat on for 20-30 minutes. ? Take off the heat if your skin turns bright red. This is very important if you cannot feel pain, heat, or cold. You may have a greater risk of getting burned.  Take over-the-counter and prescription medicines only as told by your doctor.  Return to your normal activities as told by your doctor. Ask your doctor what activities are safe for you.  Keep all follow-up visits as told by your doctor. This is important. Contact a doctor if:  You have chills or a fever.  Your pain does not go away or it gets worse.  You have a cough that does not go away. Get help right away if:  You are short of breath. This information is not intended to replace advice given to you by your health care provider. Make sure you discuss any questions you have with your health care provider. Document Released: 02/22/2008 Document Revised: 09/20/2017 Document Reviewed: 12/30/2015 Elsevier Patient Education  2020 [employer] Inc.  

## 2019-07-09 NOTE — Progress Notes (Signed)
Patient ID: Rachael Douglas, female   DOB: Dec 04, 1955, 63 y.o.   MRN: [id number] .Marland KitchenVirtual Visit via Telephone Note  I connected with Rachael Douglas on 07/09/19 at  2:40 PM EDT by telephone and verified that I am speaking with the correct person using two identifiers.  Location: Patient: home Provider: clinic   I discussed the limitations, risks, security and privacy concerns of performing an evaluation and management service by telephone and the availability of in person appointments. I also discussed with the patient that there may be a patient responsible charge related to this service. The patient expressed understanding and agreed to proceed.   History of Present Illness: Pt is a 63 yo female with CAD, COPD, GERD, hx of gastric ulcers who calls into the clinic with right sided chest pain with breathing and to the touch. Symptoms started yesterday. She does not remember any trauma to that area but she mentions going to pain clinic and being sedated for a procedure and waking up with bruise on right leg so "something could have happened while she was sedated". She denies any cough, SOB, abdominal pain. Not worse with eating or exertion. No fever, chills, body aches. No lower leg edema. Not tried anything to make better.   .. Active Ambulatory Problems    Diagnosis Date Noted  . Hypothyroidism 05/16/2014  . Anxiety disorder 05/16/2014  . Aortic heart valve narrowing 04/12/2013  . Arthropathia 07/10/2008  . Leg pain 12/04/2012  . DDD (degenerative disc disease), lumbar 05/16/2014  . Anxiety and depression 05/16/2014  . Gastric catarrh 08/30/2013  . Acid reflux 05/16/2014  . HLD (hyperlipidemia) 05/16/2014  . Arteriosclerosis of coronary artery 08/30/2013  . OP (osteoporosis) 05/16/2014  . Major depressive disorder, recurrent episode (HCC) 05/16/2014  . COPD, moderate (HCC) 09/25/2014  . Stricture and stenosis of esophagus 10/23/2014  . Aortic valve replaced 10/23/2014  .  Constipation 12/17/2014  . Complication of surgery 11/13/2015  . Left foot pain 02/05/2016  . Failure to attend appointment 05/05/2016  . Diarrhea 08/01/2016  . Multiple gastric ulcers 09/05/2016  . Collagenous colitis 09/09/2016  . Right shoulder pain 11/23/2016  . Left shoulder pain 11/23/2016  . Trochanteric bursitis of both hips 12/07/2016  . Primary insomnia 05/16/2017  . Chronic prescription benzodiazepine use 07/04/2017  . Rash and nonspecific skin eruption 07/04/2017  . Left carotid bruit 08/15/2017  . Actinic keratoses 12/03/2017  . Pruritus 02/21/2018  . Ankle ligament laxity, right 05/28/2018  . Eczema 05/30/2018  . Palpable mass of lower back 05/31/2018  . Chronic bilateral low back pain without sciatica 05/31/2018  . Anxiety 05/31/2018  . Easy bruising 09/12/2018   Resolved Ambulatory Problems    Diagnosis Date Noted  . No Resolved Ambulatory Problems   Past Medical History:  Diagnosis Date  . Arthritis   . Heart disease   . Hyperlipidemia   . Hypertension   . PUD (peptic ulcer disease)   . Thyroid disease    Reviewed med, allergy, problem list.      Observations/Objective: NO acute distress.  No labored breathing or cough.  Normal mood.   .. Today's Vitals   07/09/19 1410  BP: 118/71  Pulse: 67  Temp: 98.2 F (36.8 C)  TempSrc: Oral  SpO2: 96%  Weight: 133 lb (60.3 kg)  Height: 5\' 4"  (1.626 m)   Body mass index is 22.83 kg/m.    Assessment and Plan: Unable to work up completely over the phone. I do not have a suspicion  for COVID. Pt was asked to come into office for CXR and PE.   Follow Up Instructions:    I discussed the assessment and treatment plan with the patient. The patient was provided an opportunity to ask questions and all were answered. The patient agreed with the plan and demonstrated an understanding of the instructions.   The patient was advised to call back or seek an in-person evaluation if the symptoms worsen or if  the condition fails to improve as anticipated.  I provided 10 minutes of non-face-to-face time during this encounter.   Iran Planas, PA-C

## 2019-07-09 NOTE — Progress Notes (Deleted)
Started yesterday:  Chest pain when taking a deep breath  SOB  No cough, no fevers, no congestion, no other symptoms

## 2019-07-10 ENCOUNTER — Telehealth: Payer: Self-pay | Admitting: Cardiology

## 2019-07-10 ENCOUNTER — Telehealth: Payer: Self-pay

## 2019-07-10 NOTE — Telephone Encounter (Signed)
Patient called to make an appt for [facility] and asked about getting an Echo done before the appt or not.  Thanks!

## 2019-07-10 NOTE — Telephone Encounter (Signed)
If not having new symptoms, does not need FU echo at this time Kirk Ruths

## 2019-07-10 NOTE — Telephone Encounter (Signed)
Ok that explains a lot and makes costochondritis/pulled right sided muscle more reasonable.

## 2019-07-10 NOTE — Telephone Encounter (Signed)
Patient called to let Dr Georgina Snell and [name] know that she received call from pain clinic and was advised that she did have a fall. States that she fell down very hard on her side and hit her arm on a cabinet, Patient states she is shocked because she had no idea and no recollection of this happening.   FYI to providers

## 2019-07-10 NOTE — Progress Notes (Addendum)
   Subjective:    Patient ID: Rachael Douglas, female    DOB: 06-13-1956, 63 y.o.   MRN: [id number]  HPI See PHONE NOTE.    Review of Systems See HPI phone note.    Objective:   Physical Exam Vitals signs reviewed.  Constitutional:      Appearance: She is well-developed.  HENT:     Head: Normocephalic.  Neck:     Musculoskeletal: Normal range of motion.  Cardiovascular:     Rate and Rhythm: Normal rate and regular rhythm.     Heart sounds: Normal heart sounds.  Pulmonary:     Effort: Pulmonary effort is normal. No respiratory distress.     Breath sounds: Normal breath sounds. No wheezing, rhonchi or rales.  Chest:     Chest wall: Tenderness present. No mass, deformity or edema.     Comments: Right side tenderness to palpation.  Abdominal:     General: Bowel sounds are normal.     Palpations: Abdomen is soft.     Tenderness: There is no abdominal tenderness. There is no guarding or rebound.  Musculoskeletal:     Right lower leg: She exhibits no tenderness. No edema.     Left lower leg: She exhibits no tenderness. No edema.  Skin:    Comments: Large bruise on her right side upper leg.   Neurological:     General: No focal deficit present.     Mental Status: She is alert.  Psychiatric:        Mood and Affect: Mood normal.           Assessment & Plan:  Marland KitchenMarland KitchenViolet was seen today for chest pain.  Diagnoses and all orders for this visit:  Costochondritis -     ketorolac (TORADOL) injection 60 mg -     diclofenac sodium (VOLTAREN) 1 % GEL; Apply 4 g topically 4 (four) times daily. To affected joint.  Chest pain varying with breathing -     DG Chest 2 View -     ketorolac (TORADOL) injection 60 mg -     diclofenac sodium (VOLTAREN) 1 % GEL; Apply 4 g topically 4 (four) times daily. To affected joint.  Flu vaccine need -     Flu Vaccine QUAD 36+ mos IM   No viral signs or symptoms. No peripheral edema. CXR was normal. No abdominal pain or signs of ulcer. BP and  pulse ox good. Suspect costochondritis. Pt with hx of ulcers cannot take oral NSAIDS. toradol 60mg  given in office today. Diclofenac gel to use 4 times a day. HO given. Use a lot of ice. Follow up with any new symptoms or worsening pain.

## 2019-07-10 NOTE — Telephone Encounter (Signed)
Will forward for dr crenshaw review  

## 2019-07-11 ENCOUNTER — Other Ambulatory Visit: Payer: Self-pay

## 2019-07-11 ENCOUNTER — Emergency Department (INDEPENDENT_AMBULATORY_CARE_PROVIDER_SITE_OTHER)
Admission: EM | Admit: 2019-07-11 | Discharge: 2019-07-11 | Disposition: A | Discharge status: Other Acute Inpatient Hospital | Payer: Medicaid Other | Source: Home / Self Care

## 2019-07-11 DIAGNOSIS — R42 Dizziness and giddiness: Secondary | ICD-10-CM

## 2019-07-11 DIAGNOSIS — I959 Hypotension, unspecified: Secondary | ICD-10-CM

## 2019-07-11 DIAGNOSIS — R8290 Unspecified abnormal findings in urine: Secondary | ICD-10-CM

## 2019-07-11 DIAGNOSIS — R062 Wheezing: Secondary | ICD-10-CM

## 2019-07-11 DIAGNOSIS — G8929 Other chronic pain: Secondary | ICD-10-CM

## 2019-07-11 DIAGNOSIS — M545 Low back pain, unspecified: Secondary | ICD-10-CM

## 2019-07-11 NOTE — ED Triage Notes (Signed)
Her jacket was under her yesterday, and she pulled it out and has been having lower back pain since.  Pain also radiates up the left side.

## 2019-07-11 NOTE — Discharge Instructions (Addendum)
°  Your blood pressure is dangerously low.  Along with wheezing in your lungs, dizziness, and bad smelling urine, there is concern you have an infection in your body causing all your symptoms.  You have declined EMS transport, however, it is still highly recommended you have your husband drive you to the closest emergency department this evening for further evaluation and treatment of your symptoms.  Please do not stop along the way.  BP upon arrival 86/61 at 5:33PM, recheck 77/58 at 5:54PM

## 2019-07-11 NOTE — Telephone Encounter (Signed)
Spoke with pt, Aware of dr crenshaw's recommendations.  °

## 2019-07-11 NOTE — ED Provider Notes (Signed)
[name]    CSN: [id number] Arrival date & time: 07/11/19  1708      History   Chief Complaint Chief Complaint  Patient presents with  . Back Pain    HPI Rachael Douglas is a 63 y.o. female.   HPI Rachael Douglas is a 63 y.o. female presenting to UC with c/o worsening bilateral low back pain since yesterday after pulling a jacket out from under her.  Pain is aching and sore, difficult to get into a comfortable position.  Pt completed a virtual visit yesterday with her PCP due to chest pain and had a CXR, which was normal. She was dx with costochondritis.  She was also seen by Dr. Denyse Amass last week for acute Right knee and ankle pain.  She has been seeing the pain clinic for chronic back pain and had a procedure recently. She was advised that she fell during the procedure, which could be contributing to some of her pain.  She did not call the pain clinic today.  She took her pain medication around 7AM this morning, none since.  BP is low in triage, per medical records and pt, no hx of same. Pt notes she has felt dizzy the last few days and is concerned she may have a UTI due to strong smelling urine.  Last UTI was similar smell a few months ago. Pt does note she uses a self-catheter at times to urinate.    Past Medical History:  Diagnosis Date  . Arthritis   . Heart disease   . Hyperlipidemia   . Hypertension   . PUD (peptic ulcer disease)   . Thyroid disease     Patient Active Problem List   Diagnosis Date Noted  . Easy bruising 09/12/2018  . Palpable mass of lower back 05/31/2018  . Chronic bilateral low back pain without sciatica 05/31/2018  . Anxiety 05/31/2018  . Eczema 05/30/2018  . Ankle ligament laxity, right 05/28/2018  . Pruritus 02/21/2018  . Actinic keratoses 12/03/2017  . Left carotid bruit 08/15/2017  . Chronic prescription benzodiazepine use 07/04/2017  . Rash and nonspecific skin eruption 07/04/2017  . Primary insomnia 05/16/2017  .  Trochanteric bursitis of both hips 12/07/2016  . Right shoulder pain 11/23/2016  . Left shoulder pain 11/23/2016  . Collagenous colitis 09/09/2016  . Multiple gastric ulcers 09/05/2016  . Diarrhea 08/01/2016  . Failure to attend appointment 05/05/2016  . Left foot pain 02/05/2016  . Complication of surgery 11/13/2015  . Constipation 12/17/2014  . Stricture and stenosis of esophagus 10/23/2014  . Aortic valve replaced 10/23/2014  . COPD, moderate (HCC) 09/25/2014  . Hypothyroidism 05/16/2014  . Anxiety disorder 05/16/2014  . DDD (degenerative disc disease), lumbar 05/16/2014  . Anxiety and depression 05/16/2014  . Acid reflux 05/16/2014  . HLD (hyperlipidemia) 05/16/2014  . OP (osteoporosis) 05/16/2014  . Major depressive disorder, recurrent episode (HCC) 05/16/2014  . Gastric catarrh 08/30/2013  . Arteriosclerosis of coronary artery 08/30/2013  . Aortic heart valve narrowing 04/12/2013  . Leg pain 12/04/2012  . Arthropathia 07/10/2008    Past Surgical History:  Procedure Laterality Date  . AORTIC VALVE REPLACEMENT    . TOTAL ABDOMINAL HYSTERECTOMY      OB History   No obstetric history on file.      Home Medications    Prior to Admission medications   Medication Sig Start Date End Date Taking? Authorizing Provider  albuterol (PROAIR HFA) 108 (90 BASE) MCG/ACT inhaler INHALE 2 PUFFS EVERY  6 HOURS AS NEEDED 04/23/14   [provider]  ALPRAZolam Prudy Feeler(XANAX) 1 MG tablet Take 1 tablet (1 mg total) by mouth 2 (two) times daily. 01/16/19   Jomarie LongsBreeback, Jade L, PA-C  aspirin EC 81 MG tablet Take 1 tablet (81 mg total) by mouth daily. 03/21/18   Lewayne Buntingrenshaw, Brian S, MD  clobetasol cream (TEMOVATE) 0.05 % Apply 1 application topically 2 (two) times daily. 07/04/17   Carlis Stableummings, Charley Elizabeth, PA-C  cyclobenzaprine (FLEXERIL) 10 MG tablet Take 1 tablet (10 mg total) by mouth 3 (three) times daily. 06/20/19   Breeback, Jade L, PA-C  diclofenac sodium (VOLTAREN) 1 % GEL Apply 4 g  topically 4 (four) times daily. To affected joint. 07/09/19   Breeback, Lonna CobbJade L, PA-C  DULoxetine (CYMBALTA) 60 MG capsule TAKE 2 CAPSULES(120 MG) BY MOUTH DAILY 01/08/19   Breeback, Jade L, PA-C  furosemide (LASIX) 40 MG tablet TAKE 1 TABLET(40 MG) BY MOUTH DAILY AS NEEDED FOR SWELLING 04/19/19   Breeback, Jade L, PA-C  gabapentin (NEURONTIN) 600 MG tablet TAKE 2 TABLETS(1200 MG) BY MOUTH THREE TIMES DAILY 01/22/19   Breeback, Jade L, PA-C  HYDROmorphone (DILAUDID) 2 MG tablet TK 1 T PO Q 8 H PRN 03/14/19   [provider]  hydrOXYzine (ATARAX/VISTARIL) 50 MG tablet TAKE 1 TABLET(50 MG) BY MOUTH EVERY 8 HOURS AS NEEDED FOR ITCHING 10/12/18   Breeback, Jade L, PA-C  levothyroxine (SYNTHROID) 75 MCG tablet Take 1 tablet (75 mcg total) by mouth daily before breakfast. 02/25/19   Breeback, Lonna CobbJade L, PA-C  linaclotide (LINZESS) 145 MCG CAPS capsule Take 1 capsule (145 mcg total) by mouth daily before breakfast. 07/01/19   Breeback, Jade L, PA-C  metoprolol tartrate (LOPRESSOR) 25 MG tablet Take 1 tablet (25 mg total) by mouth 2 (two) times daily. 04/25/19   Breeback, Jade L, PA-C  mometasone-formoterol (DULERA) 200-5 MCG/ACT AERO Inhale 1 puff into the lungs daily. 03/10/15   Hommel, Sean, DO  Omega 3 1200 MG CAPS Take by mouth.    [provider]  pantoprazole (PROTONIX) 40 MG tablet Take 1 tablet (40 mg total) by mouth daily. 04/02/19   Breeback, Lonna CobbJade L, PA-C  Potassium 75 MG TABS Take 2 tablets (150 mg total) by mouth as directed. 04/02/19   Breeback, Jade L, PA-C  potassium chloride (KLOR-CON 10) 10 MEQ tablet Take 1 tablet (10 mEq total) by mouth daily. 05/06/19   Breeback, Jade L, PA-C  pravastatin (PRAVACHOL) 80 MG tablet TAKE 1 TABLET(80 MG) BY MOUTH DAILY 05/28/19   Breeback, Jade L, PA-C  traZODone (DESYREL) 50 MG tablet TAKE 1 TO 2 TABLETS BY MOUTH EVERY NIGHT 1 HOUR BEFORE BEDTIME 04/19/19   Breeback, Jade L, PA-C  triamcinolone (KENALOG) 0.1 % paste Place thin layer on mouth ulcers after meals  BID to TID.  May use 5 to 7 days. 02/28/18   Breeback, [name] L, PA-C  Triamcinolone Acetonide (TRIAMCINOLONE 0.1 % CREAM : EUCERIN) CREA Apply 1 application topically 2 (two) times daily as needed. 06/20/19   Breeback, Jade L, PA-C  triamcinolone cream (KENALOG) 0.1 % APPLY EXTERNALLY TO THE AFFECTED AREA TWICE DAILY. AVOID FACE 09/13/18   Breeback, Lonna CobbJade L, PA-C  Vitamin D, Ergocalciferol, (DRISDOL) 1.25 MG (50000 UT) CAPS capsule TAKE 1 CAPSULE BY MOUTH EVERY 7 DAYS 09/06/18   Breeback, Jade L, PA-C  zolpidem (AMBIEN) 10 MG tablet Take 1 tablet (10 mg total) by mouth at bedtime as needed. for sleep 04/02/19   Jomarie LongsBreeback, Jade L, PA-C  Family History Family History  Problem Relation Age of Onset  . Cancer Other   . Depression Other   . Diabetes Other   . Hypertension Other   . Breast cancer Other     Social History Social History   Tobacco Use  . Smoking status: Current Every Day Smoker    Types: E-cigarettes    Last attempt to quit: 05/30/2013    Years since quitting: 6.1  . Smokeless tobacco: Never Used  Substance Use Topics  . Alcohol use: No    Alcohol/week: 0.0 standard drinks  . Drug use: No     Allergies   Penicillins, Ranitidine, Alendronate, and Bisphosphonates   Review of Systems Review of Systems  Constitutional: Positive for fatigue. Negative for chills and fever.  Gastrointestinal: Negative for abdominal pain, diarrhea, nausea and vomiting.  Genitourinary: Negative for dysuria and frequency.       Malodorous urine  Musculoskeletal: Positive for back pain and myalgias.  Skin: Negative for color change and wound.  Neurological: Positive for dizziness and weakness (generalized). Negative for light-headedness and headaches.     Physical Exam Triage Vital Signs ED Triage Vitals  Enc Vitals Group     BP 07/11/19 1733 (!) 86/61     Pulse Rate 07/11/19 1733 87     Resp 07/11/19 1733 20     Temp 07/11/19 1733 98.2 F (36.8 C)     Temp Source 07/11/19 1733  Tympanic     SpO2 07/11/19 1733 98 %     Weight 07/11/19 1734 137 lb (62.1 kg)     Height 07/11/19 1734 5\' 4"  (1.626 m)     Head Circumference --      Peak Flow --      Pain Score 07/11/19 1734 10     Pain Loc --      Pain Edu? --      Excl. in GC? --    Orthostatic VS for the past 24 hrs:  BP- Lying Pulse- Lying BP- Sitting Pulse- Sitting BP- Standing at 0 minutes Pulse- Standing at 0 minutes  07/11/19 1757 98/62 86 (!) 88/57 86 (!) 71/42 86    Updated Vital Signs BP (!) 77/58 (BP Location: Right Arm)   Pulse 86   Temp 98.2 F (36.8 C) (Tympanic)   Resp 20   Ht 5\' 4"  (1.626 m)   Wt 137 lb (62.1 kg)   SpO2 98%   BMI 23.52 kg/m   Visual Acuity Right Eye Distance:   Left Eye Distance:   Bilateral Distance:    Right Eye Near:   Left Eye Near:    Bilateral Near:     Physical Exam Vitals signs and nursing note reviewed.  Constitutional:      Appearance: Normal appearance. She is well-developed.     Comments: Frail appearing female standing in exam room, rubbing her lower back. Alert and cooperative but appears uncomfortable.  HENT:     Head: Normocephalic and atraumatic.  Neck:     Musculoskeletal: Normal range of motion.  Cardiovascular:     Rate and Rhythm: Normal rate and regular rhythm.  Pulmonary:     Effort: Pulmonary effort is normal. No respiratory distress.     Breath sounds: Wheezing and rhonchi present.     Comments: Diffuse wheeze and rhonchi. Wheeze audible without stethoscope. No respiratory distress. Pt able to speak in full sentences. Abdominal:     General: There is no distension.     Palpations: Abdomen is soft.  Tenderness: There is no abdominal tenderness. There is right CVA tenderness and left CVA tenderness.  Musculoskeletal: Normal range of motion.        General: Tenderness present.     Comments: Diffuse tenderness of back. No localized tenderness. Antalgic gait, gradually improves the more pt moves. Slow to change positions from  lying to sitting and sitting to standing due to pain.  Skin:    General: Skin is warm and dry.  Neurological:     Mental Status: She is alert and oriented to person, place, and time.  Psychiatric:        Behavior: Behavior normal.      UC Treatments / Results  Labs (all labs ordered are listed, but only abnormal results are displayed) Labs Reviewed - No data to display  EKG   Radiology No results found.  Procedures Procedures (including critical care time)  Medications Ordered in UC Medications - No data to display  Initial Impression / Assessment and Plan / UC Course  I have reviewed the triage vital signs and the nursing notes.  Pertinent labs & imaging results that were available during my care of the patient were reviewed by me and considered in my medical decision making (see chart for details).     Pt c/o exacerbation of chronic back pain.  Pt found to be hypotensive in UC even with orthostatic vitals and BP rechecks. Concerned pt may be becoming septic from unknown infection. Offered to run UA, however, still advise to go to hospital for low BP (no hx of same per medical records and pt) Pt states she will need to be catheterized for urine. Unable to perform this in urgent care.  Pt agreeable to have husband drive her to [facility] Emergency Department. She declined EMS transport. Pt escorted out to husband's by nursing staff.  Final Clinical Impressions(s) / UC Diagnoses   Final diagnoses:  Hypotensive episode  Dizziness  Malodorous urine  Wheeze  Acute exacerbation of chronic low back pain     Discharge Instructions      Your blood pressure is dangerously low.  Along with wheezing in your lungs, dizziness, and bad smelling urine, there is concern you have an infection in your body causing all your symptoms.  You have declined EMS transport, however, it is still highly recommended you have your husband drive you to the closest  emergency department this evening for further evaluation and treatment of your symptoms.  Please do not stop along the way.  BP upon arrival 86/61 at 5:33PM, recheck 77/58 at 5:54PM    ED Prescriptions    None     I have reviewed the PDMP during this encounter.   Noe Gens, [place] 07/11/19 1825

## 2019-07-12 ENCOUNTER — Ambulatory Visit (INDEPENDENT_AMBULATORY_CARE_PROVIDER_SITE_OTHER): Payer: Medicaid Other | Admitting: Physician Assistant

## 2019-07-12 VITALS — BP 129/81 | HR 63 | Temp 98.2°F

## 2019-07-12 DIAGNOSIS — G8929 Other chronic pain: Secondary | ICD-10-CM | POA: Diagnosis not present

## 2019-07-12 DIAGNOSIS — M545 Low back pain, unspecified: Secondary | ICD-10-CM

## 2019-07-12 DIAGNOSIS — M940 Chondrocostal junction syndrome [Tietze]: Secondary | ICD-10-CM | POA: Diagnosis not present

## 2019-07-12 DIAGNOSIS — R071 Chest pain on breathing: Secondary | ICD-10-CM

## 2019-07-12 MED ORDER — KETOROLAC TROMETHAMINE 60 MG/2ML IM SOLN
60.0000 mg | Freq: Once | INTRAMUSCULAR | Status: AC
  Administered 2019-07-12: 60 mg via INTRAMUSCULAR

## 2019-07-12 NOTE — Progress Notes (Signed)
Patient presents to office for Toradol injection per PCP.   Patient tolerated injection well in left upper outer quadrant without any immediate complications.

## 2019-07-13 ENCOUNTER — Other Ambulatory Visit: Payer: Self-pay | Admitting: Physician Assistant

## 2019-07-13 DIAGNOSIS — F419 Anxiety disorder, unspecified: Secondary | ICD-10-CM

## 2019-07-14 ENCOUNTER — Other Ambulatory Visit: Payer: Medicaid Other

## 2019-07-15 NOTE — Telephone Encounter (Signed)
Last prescribed 01/16/2019 #60 with 5 refills.  Last appt 07/12/2019.

## 2019-07-18 ENCOUNTER — Other Ambulatory Visit: Payer: Self-pay | Admitting: Physician Assistant

## 2019-07-18 DIAGNOSIS — F5101 Primary insomnia: Secondary | ICD-10-CM

## 2019-07-21 ENCOUNTER — Other Ambulatory Visit: Payer: Self-pay

## 2019-07-21 ENCOUNTER — Ambulatory Visit (INDEPENDENT_AMBULATORY_CARE_PROVIDER_SITE_OTHER): Payer: Medicaid Other

## 2019-07-21 DIAGNOSIS — M5416 Radiculopathy, lumbar region: Secondary | ICD-10-CM | POA: Diagnosis not present

## 2019-07-24 ENCOUNTER — Ambulatory Visit (INDEPENDENT_AMBULATORY_CARE_PROVIDER_SITE_OTHER): Payer: Medicaid Other | Admitting: Physician Assistant

## 2019-07-24 ENCOUNTER — Telehealth: Payer: Self-pay | Admitting: Neurology

## 2019-07-24 ENCOUNTER — Other Ambulatory Visit: Payer: Self-pay

## 2019-07-24 VITALS — BP 154/72 | HR 63 | Wt 136.0 lb

## 2019-07-24 DIAGNOSIS — F411 Generalized anxiety disorder: Secondary | ICD-10-CM

## 2019-07-24 DIAGNOSIS — Z820 Family history of epilepsy and other diseases of the nervous system: Secondary | ICD-10-CM

## 2019-07-24 DIAGNOSIS — F5101 Primary insomnia: Secondary | ICD-10-CM | POA: Diagnosis not present

## 2019-07-24 DIAGNOSIS — R413 Other amnesia: Secondary | ICD-10-CM

## 2019-07-24 MED ORDER — BUSPIRONE HCL 5 MG PO TABS: 5.0000 mg | ORAL_TABLET | Freq: Three times a day (TID) | ORAL | 0 refills | Status: DC

## 2019-07-24 MED ORDER — DONEPEZIL HCL 10 MG PO TABS: 5.0000 mg | ORAL_TABLET | Freq: Every day | ORAL | 3 refills | Status: DC

## 2019-07-24 MED ORDER — ZOLPIDEM TARTRATE 5 MG PO TABS: 5.0000 mg | ORAL_TABLET | Freq: Every evening | ORAL | 1 refills | Status: DC | PRN

## 2019-07-24 NOTE — Telephone Encounter (Signed)
Spoke with [facility] and cancelled future refills of Xanax, Trazodone, and Ambien 10 mg.

## 2019-07-24 NOTE — Patient Instructions (Addendum)
Dementia °Dementia is a condition that affects the way the brain works. It often affects memory and thinking. There are many types of dementia. Some types get worse with time and cannot be reversed. Some types of dementia include: °· Alzheimer's disease. This is the most common type. °· Vascular dementia. This type may happen due to a stroke. °· Lewy body dementia. This type may happen to people who have Parkinson's disease. °· Frontotemporal dementia. This type is caused by damage to nerve cells in certain parts of the brain. °Some people may have more than one type, and this is called mixed dementia. °What are the causes? °This condition is caused by damage to cells in the brain. Some causes that cannot be reversed include: °· Having a condition that affects the blood vessels of the brain, such as diabetes, heart disease, or blood vessel disease. °· Changes to genes. °Some causes that can be reversed or slowed include: °· Injury to the brain. °· Certain medicines. °· Infection. °· Not having enough vitamin B12 in the body, or thyroid problems. °· A tumor or blood clot in the brain. °What are the signs or symptoms? °Symptoms depend on the type of dementia. This may include: °· Problems remembering things. °· Having trouble taking a bath or putting clothes on. °· Forgetting appointments. °· Forgetting to pay bills. °· Trouble planning and making meals. °· Having trouble speaking. °· Getting lost easily. °How is this treated? °Treatment depends on the cause of the dementia. It might include taking medicines that help: °· To control the dementia. °· To slow down the dementia. °· To manage symptoms. °In some cases, treating the cause of your dementia can improve symptoms, reverse symptoms, or slow down how quickly it gets worse. Your doctor can help you find support groups and other doctors who can help with your care. °Follow these instructions at home: °Medicines °· Take over-the-counter and prescription medicines  only as told by your doctor. °· Use a pill organizer to help you manage your medicines. °· Avoidtaking medicines for pain or for sleep. °Lifestyle °· Make healthy choices: °? Be active as told by your doctor. °? Do not use any products that contain nicotine or tobacco, such as cigarettes, e-cigarettes, and chewing tobacco. If you need help quitting, ask your doctor. °? Do not drink alcohol. °? When you get stressed, do something that will help you to relax. Your doctor can give you tips. °? Spend time with other people. °· Make sure you get good sleep. To get good sleep: °? Try not to take naps during the day. °? Keep your bedroom dark and cool. °? In the few hours before you go to bed, try not to do any exercise. °? Do not have foods and drinks with caffeine at night. °Eating and drinking °· Drink enough fluid to keep your pee (urine) pale yellow. °· Eat a healthy diet. °General instructions ° °· Talk with your doctor to figure out: °? What you need help with. °? What your safety needs are. °· Ask your doctor if it is safe for you to drive. °· If told, wear a bracelet that tracks where you are or shows that you are a person with memory loss. °· Work with your family to make big decisions. °· Keep all follow-up visits as told by your doctor. This is important. °Contact a doctor if: °· You have any new symptoms. °· Your symptoms get worse. °· You have problems with swallowing or choking. °Get help   right away if: °· You feel very sad, or feel that you want to harm yourself. °· You or your family members are worried for your safety. °If you ever feel like you may hurt yourself or others, or have thoughts about taking your own life, get help right away. You can go to your nearest emergency department or call: °· Your local emergency services (911 in the [place] °· A suicide crisis helpline, such as the National Suicide Prevention Lifeline at 1-800-273-8255. This is open 24 hours a day. °Summary °· Dementia often affects  memory and thinking. °· Some types of dementia get worse with time and cannot be reversed. °· Treatment for this condition depends on the cause. °· Talk with your doctor to figure out what you need help with. °· Your doctor can help you find support groups and other doctors who can help with your care. °This information is not intended to replace advice given to you by your health care provider. Make sure you discuss any questions you have with your health care provider. °Document Released: 08/18/2008 Document Revised: 11/20/2018 Document Reviewed: 11/20/2018 °Elsevier Patient Education © 2020 [employer] ° °

## 2019-07-24 NOTE — Progress Notes (Signed)
Subjective:    Patient ID: Rachael Douglas, female    DOB: 1956/01/08, 63 y.o.   MRN: [id number]  HPI  Pt is a 63 yo female with COPD, hx of ulcers, hypothyroidism, anxiety and depression, chronic bilateral low back pain who presents to the clinic with memory concerns.   Despite memory concerns for the last year patient has not mentioned them to me until now. Concern with short term memory. She denies any long term memory loss. She denies any problems doing day to day functions like going to grocery store. She does have problems remembering what to do from day to day or what people told her. Her kids get angry with her. Her mother had alzheimer's. She is on dilaudid, gabapentin, vistaril, xanax, trazodone.   Pt goes to pain clinic and decreased her dilaudid if she does not get off xanax. She is ready to get off xanax. She reports only taking 1/2 tablet at bedtime. She is not taking trazodone of ambien. Next pain clinic appt is December 2nd.   .. Active Ambulatory Problems    Diagnosis Date Noted  . Hypothyroidism 05/16/2014  . Anxiety disorder 05/16/2014  . Aortic heart valve narrowing 04/12/2013  . Arthropathia 07/10/2008  . Leg pain 12/04/2012  . DDD (degenerative disc disease), lumbar 05/16/2014  . Anxiety and depression 05/16/2014  . Gastric catarrh 08/30/2013  . Acid reflux 05/16/2014  . HLD (hyperlipidemia) 05/16/2014  . Arteriosclerosis of coronary artery 08/30/2013  . OP (osteoporosis) 05/16/2014  . Major depressive disorder, recurrent episode ([place]) 05/16/2014  . COPD, moderate ([place]) 09/25/2014  . Stricture and stenosis of esophagus 10/23/2014  . Aortic valve replaced 10/23/2014  . Constipation 12/17/2014  . Complication of surgery 63/87/5643  . Left foot pain 02/05/2016  . Failure to attend appointment 05/05/2016  . Diarrhea 08/01/2016  . Multiple gastric ulcers 09/05/2016  . Collagenous colitis 09/09/2016  . Right shoulder pain 11/23/2016  . Left shoulder pain  11/23/2016  . Trochanteric bursitis of both hips 12/07/2016  . Primary insomnia 05/16/2017  . Chronic prescription benzodiazepine use 07/04/2017  . Left carotid bruit 08/15/2017  . Actinic keratoses 12/03/2017  . Pruritus 02/21/2018  . Ankle ligament laxity, right 05/28/2018  . Eczema 05/30/2018  . Palpable mass of lower back 05/31/2018  . Chronic bilateral low back pain without sciatica 05/31/2018  . Easy bruising 09/12/2018  . Family history of Alzheimer's disease 07/29/2019   Resolved Ambulatory Problems    Diagnosis Date Noted  . Rash and nonspecific skin eruption 07/04/2017  . Anxiety 05/31/2018   Past Medical History:  Diagnosis Date  . Arthritis   . Heart disease   . Hyperlipidemia   . Hypertension   . PUD (peptic ulcer disease)   . Thyroid disease      Review of Systems  All other systems reviewed and are negative.      Objective:   Physical Exam Vitals signs reviewed.  Constitutional:      Appearance: Normal appearance.  HENT:     Head: Normocephalic.  Cardiovascular:     Rate and Rhythm: Normal rate and regular rhythm.     Pulses: Normal pulses.     Heart sounds: Murmur present.  Pulmonary:     Effort: Pulmonary effort is normal.     Breath sounds: Normal breath sounds.  Neurological:     General: No focal deficit present.     Mental Status: She is alert.  Psychiatric:        Mood and  Affect: Mood normal.           Assessment & Plan:  [name].[name].Rachael Douglas was seen today for memory loss.  Diagnoses and all orders for this visit:  Memory changes -     RPR -     Vitamin B12 -     Sedimentation rate -     CBC -     Folate -     donepezil (ARICEPT) 10 MG tablet; Take 0.5 tablets (5 mg total) by mouth at bedtime. -     Ambulatory referral to Neurology  Family history of Alzheimer's disease -     Ambulatory referral to Neurology  Primary insomnia -     zolpidem (AMBIEN) 5 MG tablet; Take 1 tablet (5 mg total) by mouth at bedtime as needed for  sleep.  GAD (generalized anxiety disorder) -     busPIRone (BUSPAR) 5 MG tablet; Take 1 tablet (5 mg total) by mouth 3 (three) times daily.    Montreal Cognitive Assessment  07/24/2019  Visuospatial/ Executive (0/5) 1  Naming (0/3) 2  Attention: Read list of digits (0/2) 1  Attention: Read list of letters (0/1) 1  Attention: Serial 7 subtraction starting at 100 (0/3) 1  Language: Repeat phrase (0/2) 2  Language : Fluency (0/1) 0  Abstraction (0/2) 2  Delayed Recall (0/5) 1  Orientation (0/6) 5  Total 16  Adjusted Score (based on education) 17   .[name]. [facility] 07/24/2019 04/01/2019 01/08/2019 09/03/2018 06/25/2018  Decreased Interest 0 3 3 3 3   Down, Depressed, Hopeless 0 3 2 3 2   PHQ - 2 Score 0 6 5 6 5   Altered sleeping 1 1 3 3 2   Tired, decreased energy 1 3 1  0 3  Change in appetite 0 2 1 2 2   Feeling bad or failure about yourself  0 3 0 0 0  Trouble concentrating 3 3 2 3 3   Moving slowly or fidgety/restless 0 0 0 1 1  Suicidal thoughts 0 0 0 0 0  PHQ-9 Score 5 18 12 15 16   Difficult doing work/chores Somewhat difficult Somewhat difficult Somewhat difficult Very difficult Extremely dIfficult   .[name]. GAD 7 : Generalized Anxiety Score 07/24/2019 04/01/2019 01/08/2019 09/03/2018  Nervous, Anxious, on Edge 1 3 3 2   Control/stop worrying 2 1 3 2   Worry too much - different things 2 2 3 2   Trouble relaxing 1 2 3 2   Restless 1 0 0 2  Easily annoyed or irritable 0 0 0 1  Afraid - awful might happen 0 0 3 1  Total GAD 7 Score 7 8 15 12   Anxiety Difficulty Not difficult at all Not difficult at all Not difficult at all Somewhat difficult    Nurse called pharmacy and told to discontinue any rx for trazodone/xanax and ambien 10mg . Only fill ambien 5mg . belsomra is a better option but patient has tried Rachael Douglas and it "works".   Certainly medication can cause some memory issues. MOCA was decreased significantly. Pt also has a lot vascular/atherosclerotic conditions that could  cause vascular dementia. Likely needs imaging to further assess. Hopefully stopped xanax/trazodone might could help some. Dementia panel of labs ordered today. Referral made to Dr. Burnett CorrenteEdward HIll. Pt does have family hx of alzheimer's. Started aricept today. Discussed side effects. Follow up in 1 month.   Anxiety and depression better than have been in past.  Refilled buspar.   [name]..Spent 30 minutes with patient and greater than 50 percent of visit spent  counseling patient regarding treatment plan.

## 2019-07-25 LAB — CBC
HCT: 42.4 % (ref 35.0–45.0)
Hemoglobin: 13.8 g/dL (ref 11.7–15.5)
MCH: 27.4 pg (ref 27.0–33.0)
MCHC: 32.5 g/dL (ref 32.0–36.0)
MCV: 84.1 fL (ref 80.0–100.0)
MPV: 9.7 fL (ref 7.5–12.5)
Platelets: 299 10*3/uL (ref 140–400)
RBC: 5.04 10*6/uL (ref 3.80–5.10)
RDW: 14.9 % (ref 11.0–15.0)
WBC: 6.9 10*3/uL (ref 3.8–10.8)

## 2019-07-25 LAB — FOLATE: Folate: 7.7 ng/mL

## 2019-07-25 LAB — SEDIMENTATION RATE: Sed Rate: 9 mm/h (ref 0–30)

## 2019-07-25 LAB — VITAMIN B12: Vitamin B-12: 426 pg/mL (ref 200–1100)

## 2019-07-25 LAB — RPR: RPR Ser Ql: NONREACTIVE

## 2019-07-25 NOTE — Progress Notes (Signed)
Rachael Douglas,   Labs are normal.   [name]

## 2019-07-26 ENCOUNTER — Ambulatory Visit (INDEPENDENT_AMBULATORY_CARE_PROVIDER_SITE_OTHER): Payer: Medicaid Other | Admitting: Family Medicine

## 2019-07-26 ENCOUNTER — Encounter: Payer: Self-pay | Admitting: Family Medicine

## 2019-07-26 VITALS — BP 155/76 | HR 70

## 2019-07-26 DIAGNOSIS — R110 Nausea: Secondary | ICD-10-CM

## 2019-07-26 DIAGNOSIS — R197 Diarrhea, unspecified: Secondary | ICD-10-CM

## 2019-07-26 DIAGNOSIS — R5383 Other fatigue: Secondary | ICD-10-CM

## 2019-07-26 DIAGNOSIS — R519 Headache, unspecified: Secondary | ICD-10-CM

## 2019-07-26 MED ORDER — ONDANSETRON 4 MG PO TBDP: 4.0000 mg | ORAL_TABLET | Freq: Three times a day (TID) | ORAL | 0 refills | Status: DC | PRN

## 2019-07-26 NOTE — Progress Notes (Signed)
Virtual Visit via Video Note  I connected with Rachael Douglas on 07/26/19 at  2:40 PM EST by a video enabled telemedicine application and verified that I am speaking with the correct person using two identifiers.  Patient was unable to get the video portion to connect.   I discussed the limitations of evaluation and management by telemedicine and the availability of in person appointments. The patient expressed understanding and agreed to proceed.  Subjective:    CC: Sinus sxs  HPI: 63 year old female with a history of COPD and reflux reports that she has had headache nausea and fatigue for 1 day.  The headache starts at the top of her head and she does feel like she has some pressure behind her eyes.  No other facial pain.  No visual changes.  No recent medication changes.  She says she started feeling bad last night.  She actually has had some diarrhea as well she denied any vomiting.  No blood in the stool.  No fevers chills or sweats.  He did recently start buspirone as she recently came off of Xanax.  He denies any known exposure to Covid's.  She says she has not been anywhere including the grocery store except for the doctor's office.  She says she has not eaten anything unusual.  She says she just had a frozen dinner for dinner last night and her husband had the same thing and he has not had any GI symptoms either.  He says that she does have a mild sore throat but no cough or congestion.  No loss of taste or smell.  She says she also wants to know why her blood pressure so high.  She says that a couple weeks ago she actually went to the ED for hypotension.  She says they were not sure why it happened and they did not change any of her medications.  And now her blood pressures actually been running a little high.  She does have a history of aortic valve replacement and is currently on furosemide and metoprolol.  She says she takes her medications regularly and has not had any recent changes.   She does feel like her ankles have been a little bit more tight and swollen in the last couple of days.  Recent labs are normal.  Past medical history, Surgical history, Family history not pertinant except as noted below, Social history, Allergies, and medications have been entered into the medical record, reviewed, and corrections made.   Review of Systems: No fevers, chills, night sweats, weight loss, chest pain, or shortness of breath.   Objective:    General: Speaking clearly in complete sentences without any shortness of breath.  Alert and oriented x3.  Normal judgment. No apparent acute distress.    Impression and Recommendations:      Problem List Items Addressed This Visit      Other   Diarrhea    Other Visit Diagnoses    Nausea    -  Primary   Acute nonintractable headache, unspecified headache type       Fatigue, unspecified type            Nausea and diarrhea-unclear etiology could be gastroenteritis or food poisoning.  Also consider Covid though she has not had any potential exposures and she pretty much sounds like she is homebound as she did come to the doctor's office.  Also consider could be withdrawal from recently stopping alprazolam.  Zofran sent to pharmacy.  If  her symptoms do not improve over the weekend give [place] call back.  If she worsens or starts to feel dehydrated, lightheaded runs a fever or develops new symptoms then please go to the emergency department.  Elevated  blood pressure without diagnosis of hypertension-blood pressures have been elevated more recently.  It sounds like she might be a little volume overloaded based on wrinkle recent ankle swelling.  Some to have her increase her Lasix by taking an extra tab this afternoon and taking an extra tab tomorrow afternoon.  After that I want her to keep an eye on her daily weights as well as her blood pressure and see if her blood pressure actually comes down.  I do want to be cautious because it sounds like  she had a hypotensive episode about 2 weeks ago with a concomitant urinary tract infection.  Recent labs were normal.  She has normal renal function.   I discussed the assessment and treatment plan with the patient. The patient was provided an opportunity to ask questions and all were answered. The patient agreed with the plan and demonstrated an understanding of the instructions.   The patient was advised to call back or seek an in-person evaluation if the symptoms worsen or if the condition fails to improve as anticipated.  Time spent 25 minutes, Grant 49 says time spent counseling directly about nausea, diarrhea and elevated blood pressure levels.  Nani Gasser, MD

## 2019-07-29 ENCOUNTER — Encounter: Payer: Self-pay | Admitting: Physician Assistant

## 2019-07-29 DIAGNOSIS — Z820 Family history of epilepsy and other diseases of the nervous system: Secondary | ICD-10-CM | POA: Insufficient documentation

## 2019-07-30 ENCOUNTER — Telehealth: Payer: Self-pay | Admitting: Physician Assistant

## 2019-07-30 NOTE — Telephone Encounter (Signed)
Patient left me a voicemail to call her back. Once I returned her call she asked me why Dr.Metheney called her, she stated that she didn't remember an appointment with her on 07/26/2019. I stated that it looks like you and her connected and spoke about the symptoms. Patient asked, who is Dr.Metheney, is she a bone doctor? I said no she is a Primary care physician. Patient kept insisting that she did not have an appointment and that she didn't speak to anyone on Friday. She then stated oh they ordered my medication okay yes thank you and disconnected my call. Just wanted to let PCP know and make aware of this conversation.

## 2019-07-30 NOTE — Telephone Encounter (Signed)
Thanks for update. She does have some new dementia. She has referral already placed to neurology.

## 2019-07-31 ENCOUNTER — Telehealth: Payer: Self-pay | Admitting: Neurology

## 2019-07-31 NOTE — Telephone Encounter (Signed)
Patient called and request letter be written to pain management doctor letting them know she stopped xanax. Letter written and faxed to [phone] with confirmation received.

## 2019-08-01 ENCOUNTER — Other Ambulatory Visit: Payer: Self-pay | Admitting: Physician Assistant

## 2019-08-01 ENCOUNTER — Other Ambulatory Visit: Payer: Self-pay

## 2019-08-01 MED ORDER — TRIAMCINOLONE ACETONIDE 0.1 % MT PSTE: PASTE | OROMUCOSAL | 1 refills | Status: DC

## 2019-08-01 NOTE — Telephone Encounter (Signed)
Rachael Douglas would like a refill on Triamcinolone dental paste. Last filled 2019. She states she no longer has a Pharmacist, community.

## 2019-08-13 ENCOUNTER — Encounter: Payer: Self-pay | Admitting: Family Medicine

## 2019-08-13 ENCOUNTER — Ambulatory Visit (INDEPENDENT_AMBULATORY_CARE_PROVIDER_SITE_OTHER): Payer: Medicaid Other | Admitting: Family Medicine

## 2019-08-13 VITALS — BP 104/71 | Wt 132.0 lb

## 2019-08-13 DIAGNOSIS — J019 Acute sinusitis, unspecified: Secondary | ICD-10-CM

## 2019-08-13 MED ORDER — AZITHROMYCIN 250 MG PO TABS: ORAL_TABLET | ORAL | 0 refills | Status: AC

## 2019-08-13 NOTE — Progress Notes (Signed)
Virtual Visit via Video Note  I connected with Rachael Douglas on 08/13/19 at  2:20 PM EST by a video enabled telemedicine application and verified that I am speaking with the correct person using two identifiers.  Patient was unable to connect via video component so this was converted to a telephone call.   I discussed the limitations of evaluation and management by telemedicine and the availability of in person appointments. The patient expressed understanding and agreed to proceed.  Subjective:    CC: Cough.    HPI:  63 year old female complains nasal congestion and cough.  She says for about a week she has had nasal congestion and postnasal drip and runny nose.  Then starting about 4 days ago she developed a productive wet cough.  She says normally she does not get nauseated with postnasal drip but has recently.  She says the drainage is also causing a mild sore throat.  She also has a little bit of soreness in her anterior chest but she denies any shortness of breath or wheezing.  She also denies any diarrhea.  She has been taking Benadryl and says it helps a little bit.   Past medical history, Surgical history, Family history not pertinant except as noted below, Social history, Allergies, and medications have been entered into the medical record, reviewed, and corrections made.   Review of Systems: No fevers, chills, night sweats, weight loss, chest pain, or shortness of breath.   Objective:    General: Speaking clearly in complete sentences without any shortness of breath.  Alert and oriented x3.  Normal judgment. No apparent acute distress.   Impression and Recommendations:   Acute sinusitis-she has had symptoms for 1 week and has now developed a cough likely from postnasal drip.  We will go ahead and treat with azithromycin.  If not better in 1 week then please give [place] call back.  Make sure hydrating well.    I discussed the assessment and treatment plan with the patient. The  patient was provided an opportunity to ask questions and all were answered. The patient agreed with the plan and demonstrated an understanding of the instructions.   The patient was advised to call back or seek an in-person evaluation if the symptoms worsen or if the condition fails to improve as anticipated.  Spent in nonface-to-face encounter 16 minues.   Beatrice Lecher, MD

## 2019-08-13 NOTE — Progress Notes (Deleted)
HPI: FU aortic valve replacement. Previously followed at [facility]. Had bioprosthetic aortic valve replacement in 2014. Apparently had no obstructive coronary disease preoperatively. She has had a previous monitor for palpitations that was normal by report.  Echocardiogram December 2018 showed normal LV systolic function, bioprosthetic aortic valve with mean gradient 17 mmHg.  Carotid Dopplers December 2018 showed no significant stenosis.  Left subclavian flow was disturbed.  Since last seen,   Current Outpatient Medications  Medication Sig Dispense Refill  . albuterol (PROAIR HFA) 108 (90 BASE) MCG/ACT inhaler INHALE 2 PUFFS EVERY 6 HOURS AS NEEDED    . aspirin EC 81 MG tablet Take 1 tablet (81 mg total) by mouth daily.    [name] azithromycin (ZITHROMAX) 250 MG tablet 2 Ttabs PO on Day 1, then one a day x 4 days. 6 tablet 0  . busPIRone (BUSPAR) 5 MG tablet Take 1 tablet (5 mg total) by mouth 3 (three) times daily. 90 tablet 0  . clobetasol cream (TEMOVATE) 0.05 % Apply 1 application topically 2 (two) times daily. 15 g 0  . cyclobenzaprine (FLEXERIL) 10 MG tablet Take 1 tablet (10 mg total) by mouth 3 (three) times daily. 90 tablet 2  . diclofenac sodium (VOLTAREN) 1 % GEL Apply 4 g topically 4 (four) times daily. To affected joint. 100 g 1  . donepezil (ARICEPT) 10 MG tablet Take 0.5 tablets (5 mg total) by mouth at bedtime. 30 tablet 3  . DULoxetine (CYMBALTA) 60 MG capsule TAKE 2 CAPSULES(120 MG) BY MOUTH DAILY 60 capsule 5  . furosemide (LASIX) 40 MG tablet TAKE 1 TABLET(40 MG) BY MOUTH DAILY AS NEEDED FOR SWELLING 90 tablet 1  . gabapentin (NEURONTIN) 600 MG tablet TAKE 2 TABLETS(1200 MG) BY MOUTH THREE TIMES DAILY 540 tablet 1  . hydrOXYzine (ATARAX/VISTARIL) 50 MG tablet TAKE 1 TABLET(50 MG) BY MOUTH EVERY 8 HOURS AS NEEDED FOR ITCHING 60 tablet 0  . levothyroxine (SYNTHROID) 75 MCG tablet Take 1 tablet (75 mcg total) by mouth daily before breakfast. 90 tablet 3  . linaclotide (LINZESS)  145 MCG CAPS capsule Take 1 capsule (145 mcg total) by mouth daily before breakfast. 90 capsule 0  . metoprolol tartrate (LOPRESSOR) 25 MG tablet Take 1 tablet (25 mg total) by mouth 2 (two) times daily. 180 tablet 1  . mometasone-formoterol (DULERA) 200-5 MCG/ACT AERO Inhale 1 puff into the lungs daily. 3 Inhaler 3  . Omega 3 1200 MG CAPS Take by mouth.    . ondansetron (ZOFRAN ODT) 4 MG disintegrating tablet Take 1 tablet (4 mg total) by mouth every 8 (eight) hours as needed for nausea or vomiting. 12 tablet 0  . pantoprazole (PROTONIX) 40 MG tablet Take 1 tablet (40 mg total) by mouth daily. 90 tablet 1  . potassium chloride (KLOR-CON 10) 10 MEQ tablet Take 1 tablet (10 mEq total) by mouth daily. 90 tablet 1  . pravastatin (PRAVACHOL) 80 MG tablet TAKE 1 TABLET(80 MG) BY MOUTH DAILY 90 tablet 3  . Triamcinolone Acetonide (TRIAMCINOLONE 0.1 % CREAM : EUCERIN) CREA Apply 1 application topically 2 (two) times daily as needed. 1 each 3  . triamcinolone cream (KENALOG) 0.1 % APPLY EXTERNALLY TO THE AFFECTED AREA TWICE DAILY. AVOID FACE 80 g 0  . Vitamin D, Ergocalciferol, (DRISDOL) 1.25 MG (50000 UT) CAPS capsule TAKE 1 CAPSULE BY MOUTH EVERY 7 DAYS 12 capsule 0  . zolpidem (AMBIEN) 5 MG tablet Take 1 tablet (5 mg total) by mouth at bedtime as needed  for sleep. 30 tablet 1   No current facility-administered medications for this visit.      Past Medical History:  Diagnosis Date  . Arthritis   . Heart disease   . Hyperlipidemia   . Hypertension   . PUD (peptic ulcer disease)   . Thyroid disease     Past Surgical History:  Procedure Laterality Date  . AORTIC VALVE REPLACEMENT    . TOTAL ABDOMINAL HYSTERECTOMY      Social History   Socioeconomic History  . Marital status: Married    Spouse name: Not on file  . Number of children: 2  . Years of education: Not on file  . Highest education level: Not on file  Occupational History  . Not on file  Social Needs  . Financial resource  strain: Not on file  . Food insecurity    Worry: Not on file    Inability: Not on file  . Transportation needs    Medical: Not on file    Non-medical: Not on file  Tobacco Use  . Smoking status: Current Every Day Smoker    Types: E-cigarettes    Last attempt to quit: 05/30/2013    Years since quitting: 6.2  . Smokeless tobacco: Never Used  Substance and Sexual Activity  . Alcohol use: No    Alcohol/week: 0.0 standard drinks  . Drug use: No  . Sexual activity: Not Currently  Lifestyle  . Physical activity    Days per week: Not on file    Minutes per session: Not on file  . Stress: Not on file  Relationships  . Social Herbalist on phone: Not on file    Gets together: Not on file    Attends religious service: Not on file    Active member of club or organization: Not on file    Attends meetings of clubs or organizations: Not on file    Relationship status: Not on file  . Intimate partner violence    Fear of current or ex partner: Not on file    Emotionally abused: Not on file    Physically abused: Not on file    Forced sexual activity: Not on file  Other Topics Concern  . Not on file  Social History Narrative  . Not on file    Family History  Problem Relation Age of Onset  . Cancer Other   . Depression Other   . Diabetes Other   . Hypertension Other   . Breast cancer Other     ROS: no fevers or chills, productive cough, hemoptysis, dysphasia, odynophagia, melena, hematochezia, dysuria, hematuria, rash, seizure activity, orthopnea, PND, pedal edema, claudication. Remaining systems are negative.  Physical Exam: Well-developed well-nourished in no acute distress.  Skin is warm and dry.  HEENT is normal.  Neck is supple.  Chest is clear to auscultation with normal expansion.  Cardiovascular exam is regular rate and rhythm.  Abdominal exam nontender or distended. No masses palpated. Extremities show no edema. neuro grossly intact  ECG- personally  reviewed  A/P  1 prior aortic valve replacement-continue SBE prophylaxis.  2 hypertension-patient's blood pressure is controlled.  Continue present medications and follow.  3 hyperlipidemia-continue statin.  Kirk Ruths, MD

## 2019-08-17 ENCOUNTER — Other Ambulatory Visit: Payer: Self-pay | Admitting: Physician Assistant

## 2019-08-17 DIAGNOSIS — F331 Major depressive disorder, recurrent, moderate: Secondary | ICD-10-CM

## 2019-08-20 ENCOUNTER — Other Ambulatory Visit: Payer: Self-pay | Admitting: *Deleted

## 2019-08-20 DIAGNOSIS — F411 Generalized anxiety disorder: Secondary | ICD-10-CM

## 2019-08-20 DIAGNOSIS — F5101 Primary insomnia: Secondary | ICD-10-CM

## 2019-08-21 ENCOUNTER — Ambulatory Visit: Payer: Medicaid Other | Admitting: Cardiology

## 2019-08-21 ENCOUNTER — Ambulatory Visit: Payer: Medicaid Other | Admitting: Physician Assistant

## 2019-08-21 MED ORDER — ZOLPIDEM TARTRATE 5 MG PO TABS: 5.0000 mg | ORAL_TABLET | Freq: Every evening | ORAL | 1 refills | Status: DC | PRN

## 2019-08-21 MED ORDER — BUSPIRONE HCL 5 MG PO TABS: 5.0000 mg | ORAL_TABLET | Freq: Three times a day (TID) | ORAL | 0 refills | Status: DC

## 2019-08-23 ENCOUNTER — Ambulatory Visit: Payer: Medicaid Other | Admitting: Physician Assistant

## 2019-08-30 ENCOUNTER — Telehealth: Payer: Self-pay | Admitting: Physician Assistant

## 2019-08-30 DIAGNOSIS — G8929 Other chronic pain: Secondary | ICD-10-CM

## 2019-08-30 DIAGNOSIS — M5416 Radiculopathy, lumbar region: Secondary | ICD-10-CM

## 2019-08-30 DIAGNOSIS — M545 Low back pain, unspecified: Secondary | ICD-10-CM

## 2019-08-30 DIAGNOSIS — R29898 Other symptoms and signs involving the musculoskeletal system: Secondary | ICD-10-CM

## 2019-08-30 NOTE — Telephone Encounter (Signed)
Ok for shower chair?

## 2019-08-30 NOTE — Telephone Encounter (Signed)
Patient called and had a procedure done last week. She is wanting to know if you can get a shower chair sent to [facility]. Please advise.

## 2019-09-02 NOTE — Telephone Encounter (Signed)
Written and faxed to [facility] at [phone] with confirmation received.

## 2019-09-05 ENCOUNTER — Telehealth: Payer: Self-pay | Admitting: Physician Assistant

## 2019-09-05 NOTE — Telephone Encounter (Signed)
Patient called and is aware that she was taken off her Xanax and she is now in need of a prescription sent to [facility]. She reports " I would rather be in pain and not having my stomach feel like its turned upside down". Please advise .

## 2019-09-06 MED ORDER — METOPROLOL TARTRATE 25 MG PO TABS: 25.0000 mg | ORAL_TABLET | Freq: Two times a day (BID) | ORAL | 1 refills | Status: DC

## 2019-09-06 NOTE — Telephone Encounter (Signed)
Patient is agreeable to increase her Buspar and not take the Xanax. Please advise.

## 2019-09-06 NOTE — Telephone Encounter (Signed)
How about try the buspar 10mg  three times a day that would be an increase from 5mg  three times a day.

## 2019-09-08 NOTE — Telephone Encounter (Signed)
Ok to send increase.

## 2019-09-09 MED ORDER — BUSPIRONE HCL 10 MG PO TABS: 10.0000 mg | ORAL_TABLET | Freq: Three times a day (TID) | ORAL | 0 refills | Status: DC

## 2019-09-09 NOTE — Telephone Encounter (Signed)
RX sent

## 2019-09-16 ENCOUNTER — Other Ambulatory Visit: Payer: Self-pay | Admitting: Physician Assistant

## 2019-09-25 ENCOUNTER — Other Ambulatory Visit: Payer: Self-pay | Admitting: Physician Assistant

## 2019-09-25 DIAGNOSIS — K5900 Constipation, unspecified: Secondary | ICD-10-CM

## 2019-10-01 ENCOUNTER — Ambulatory Visit (INDEPENDENT_AMBULATORY_CARE_PROVIDER_SITE_OTHER): Payer: Medicaid Other | Admitting: Physician Assistant

## 2019-10-01 VITALS — Temp 98.7°F | Ht 64.0 in | Wt 124.0 lb

## 2019-10-01 DIAGNOSIS — J019 Acute sinusitis, unspecified: Secondary | ICD-10-CM

## 2019-10-01 DIAGNOSIS — F419 Anxiety disorder, unspecified: Secondary | ICD-10-CM

## 2019-10-01 MED ORDER — AZITHROMYCIN 250 MG PO TABS: ORAL_TABLET | ORAL | 0 refills | Status: DC

## 2019-10-01 MED ORDER — HYDROXYZINE HCL 50 MG PO TABS: ORAL_TABLET | ORAL | 0 refills | Status: DC

## 2019-10-01 NOTE — Progress Notes (Addendum)
..Virtual Visit via Video Note  I connected with Rachael Douglas on 10/01/2019 at 11:30 AM EST by a video enabled telemedicine application and verified that I am speaking with the correct person using two identifiers.  Location: Patient: home Provider: clinic   I discussed the limitations of evaluation and management by telemedicine and the availability of in person appointments. The patient expressed understanding and agreed to proceed.  History of Present Illness: Pt is a 64 yo female who presents to the clinic with URI symptoms.  Symptoms started a week ago: sinus drainage, blow nose ears pop, benadryl & sudafed help some. HA in forehead, maxillary pressure, having post nasal drip. Throat is raw. Can smell, is taste is off. No COVID exposure. No sick contacts. Malaise, muscle aches. Cough she thinks from post nasal drip. Lymph nodes swollen. Had sinus infections before, last one a few months. Dizziness. Nauseous but has not thrown up.   Has stopped taking Buspar d/t HA & Nausea. Symptoms resolved but anxiety persists. She is not on xanax due to her pain clinic provider. On cymbalta.   .. Active Ambulatory Problems    Diagnosis Date Noted  . Hypothyroidism 05/16/2014  . Anxiety disorder 05/16/2014  . Aortic heart valve narrowing 04/12/2013  . Arthropathia 07/10/2008  . Leg pain 12/04/2012  . DDD (degenerative disc disease), lumbar 05/16/2014  . Anxiety and depression 05/16/2014  . Gastric catarrh 08/30/2013  . Acid reflux 05/16/2014  . HLD (hyperlipidemia) 05/16/2014  . Arteriosclerosis of coronary artery 08/30/2013  . OP (osteoporosis) 05/16/2014  . Major depressive disorder, recurrent episode ([place]) 05/16/2014  . COPD, moderate ([place]) 09/25/2014  . Stricture and stenosis of esophagus 10/23/2014  . Aortic valve replaced 10/23/2014  . Constipation 12/17/2014  . Complication of surgery 71/02/2693  . Left foot pain 02/05/2016  . Failure to attend appointment 05/05/2016  . Diarrhea  08/01/2016  . Multiple gastric ulcers 09/05/2016  . Collagenous colitis 09/09/2016  . Right shoulder pain 11/23/2016  . Left shoulder pain 11/23/2016  . Trochanteric bursitis of both hips 12/07/2016  . Primary insomnia 05/16/2017  . Chronic prescription benzodiazepine use 07/04/2017  . Left carotid bruit 08/15/2017  . Actinic keratoses 12/03/2017  . Pruritus 02/21/2018  . Ankle ligament laxity, right 05/28/2018  . Eczema 05/30/2018  . Palpable mass of lower back 05/31/2018  . Chronic bilateral low back pain without sciatica 05/31/2018  . Easy bruising 09/12/2018  . Family history of Alzheimer's disease 07/29/2019   Resolved Ambulatory Problems    Diagnosis Date Noted  . Rash and nonspecific skin eruption 07/04/2017  . Anxiety 05/31/2018   Past Medical History:  Diagnosis Date  . Arthritis   . Heart disease   . Hyperlipidemia   . Hypertension   . PUD (peptic ulcer disease)   . Thyroid disease    Reviewed med, allergy, problem list.     Observations/Objective: No acute distress.  Normal respirations and breathing.   .. Today's Vitals   10/01/19 1107  Temp: 98.7 F (37.1 C)  TempSrc: Oral  Weight: 124 lb (56.2 kg)  Height: 5\' 4"  (1.626 m)   Body mass index is 21.28 kg/m.    Assessment and Plan: Marland KitchenMarland KitchenViolet was seen today for sore throat and nasal congestion.  Diagnoses and all orders for this visit:  Acute non-recurrent sinusitis, unspecified location -     azithromycin (ZITHROMAX) 250 MG tablet; Take 2 tablets now and then one tablet for 4 days.  Anxiety -     hydrOXYzine (ATARAX/VISTARIL)  50 MG tablet; TAKE 1 TABLET(50 MG) BY MOUTH EVERY 8 HOURS AS NEEDED FOR ITCHING/anxiety.   Treated for sinusitis with zpak. Use flonase/nasacort OTC. Rest and hydrate.   Pt is off xanax due to being on pain rx for chronic pain. She stopped buspar due to side effects of nausea and headaches. Nausea and headaches resolved but still very anxious. Try 1/2 to 1 tablet of  hydroxyzine as needed to replace xanax for anxiety.   Follow Up Instructions:    I discussed the assessment and treatment plan with the patient. The patient was provided an opportunity to ask questions and all were answered. The patient agreed with the plan and demonstrated an understanding of the instructions.   The patient was advised to call back or seek an in-person evaluation if the symptoms worsen or if the condition fails to improve as anticipated.    Marlyn Corporal, Student-PA   .Harlon Flor PA-C, have reviewed and agree with the above documentation in it's entirety.

## 2019-10-01 NOTE — Progress Notes (Signed)
Symptoms started a week ago: Congestion/runny nose Ears popping Sore throat Headache Dizziness Lost taste/ can still smell Nausea  No vomiting or diarrhea No sick contacts  Taking benadryl and sudafed/ helps a little bit

## 2019-10-02 ENCOUNTER — Encounter: Payer: Self-pay | Admitting: Physician Assistant

## 2019-10-07 ENCOUNTER — Telehealth: Payer: Self-pay | Admitting: Physician Assistant

## 2019-10-07 NOTE — Telephone Encounter (Signed)
Patient called and wants to know if you can increase her Linzess to the double dose. She reports that she was told to ask you about the increase.   She is also not taking the Xanax anymore and wants to increase her Ambien to 10 mg.She reports she is not sleeping with the 5 mg and wants an increase.  Please advise.

## 2019-10-08 MED ORDER — ZOLPIDEM TARTRATE 10 MG PO TABS: 10.0000 mg | ORAL_TABLET | Freq: Every evening | ORAL | 1 refills | Status: DC | PRN

## 2019-10-08 MED ORDER — LINACLOTIDE 290 MCG PO CAPS: 290.0000 ug | ORAL_CAPSULE | Freq: Every day | ORAL | 1 refills | Status: DC

## 2019-10-08 NOTE — Telephone Encounter (Signed)
Sent over increase in linzess and ambien to try. Follow up in one month to discuss improvement.

## 2019-10-08 NOTE — Telephone Encounter (Signed)
I called the patient and she is aware. She will call back to make a follow up with PCP.

## 2019-10-16 ENCOUNTER — Other Ambulatory Visit: Payer: Self-pay

## 2019-10-16 DIAGNOSIS — L309 Dermatitis, unspecified: Secondary | ICD-10-CM

## 2019-10-16 MED ORDER — TRIAMCINOLONE 0.1 % CREAM:EUCERIN CREAM 1:1: 1.0000 "application " | TOPICAL_CREAM | Freq: Two times a day (BID) | CUTANEOUS | 3 refills | Status: DC | PRN

## 2019-10-16 NOTE — Telephone Encounter (Signed)
Rachael Douglas states she needs a refill on the Triamcinolone cream. I called the pharmacy and they state the Triamcinolone Eucerin cream has expired.

## 2019-10-24 NOTE — Progress Notes (Deleted)
HPI: FU aortic valve replacement.Previously followed at [facility]. Had bioprosthetic aortic valve replacement in 2014. Apparently had no obstructive coronary disease preoperatively. She has had a previous monitor for palpitations that was normal by report.  Echocardiogram December 2018 showed normal LV systolic function, bioprosthetic aortic valve with mean gradient 17 mmHg.  Carotid Dopplers December 2018 showed no significant stenosis.  Left subclavian flow was disturbed.  Since last seen,   Current Outpatient Medications  Medication Sig Dispense Refill  . albuterol (PROAIR HFA) 108 (90 BASE) MCG/ACT inhaler INHALE 2 PUFFS EVERY 6 HOURS AS NEEDED    . aspirin EC 81 MG tablet Take 1 tablet (81 mg total) by mouth daily.    [name] azithromycin (ZITHROMAX) 250 MG tablet Take 2 tablets now and then one tablet for 4 days. 6 tablet 0  . clobetasol cream (TEMOVATE) 0.05 % Apply 1 application topically 2 (two) times daily. 15 g 0  . cyclobenzaprine (FLEXERIL) 10 MG tablet TAKE 1 TABLET(10 MG) BY MOUTH THREE TIMES DAILY 90 tablet 2  . diclofenac sodium (VOLTAREN) 1 % GEL Apply 4 g topically 4 (four) times daily. To affected joint. 100 g 1  . donepezil (ARICEPT) 10 MG tablet Take 0.5 tablets (5 mg total) by mouth at bedtime. 30 tablet 3  . DULoxetine (CYMBALTA) 60 MG capsule TAKE 2 CAPSULES(120 MG) BY MOUTH DAILY 60 capsule 5  . furosemide (LASIX) 40 MG tablet TAKE 1 TABLET(40 MG) BY MOUTH DAILY AS NEEDED FOR SWELLING 90 tablet 1  . gabapentin (NEURONTIN) 600 MG tablet TAKE 2 TABLETS(1200 MG) BY MOUTH THREE TIMES DAILY 540 tablet 1  . hydrOXYzine (ATARAX/VISTARIL) 50 MG tablet TAKE 1 TABLET(50 MG) BY MOUTH EVERY 8 HOURS AS NEEDED FOR ITCHING/anxiety. 60 tablet 0  . levothyroxine (SYNTHROID) 75 MCG tablet Take 1 tablet (75 mcg total) by mouth daily before breakfast. 90 tablet 3  . linaclotide (LINZESS) 290 MCG CAPS capsule Take 1 capsule (290 mcg total) by mouth daily. 90 capsule 1  . metoprolol tartrate  (LOPRESSOR) 25 MG tablet Take 1 tablet (25 mg total) by mouth 2 (two) times daily. 180 tablet 1  . mometasone-formoterol (DULERA) 200-5 MCG/ACT AERO Inhale 1 puff into the lungs daily. 3 Inhaler 3  . Omega 3 1200 MG CAPS Take by mouth.    . ondansetron (ZOFRAN ODT) 4 MG disintegrating tablet Take 1 tablet (4 mg total) by mouth every 8 (eight) hours as needed for nausea or vomiting. 12 tablet 0  . pantoprazole (PROTONIX) 40 MG tablet Take 1 tablet (40 mg total) by mouth daily. 90 tablet 1  . potassium chloride (KLOR-CON 10) 10 MEQ tablet Take 1 tablet (10 mEq total) by mouth daily. 90 tablet 1  . pravastatin (PRAVACHOL) 80 MG tablet TAKE 1 TABLET(80 MG) BY MOUTH DAILY 90 tablet 3  . Triamcinolone Acetonide (TRIAMCINOLONE 0.1 % CREAM : EUCERIN) CREA Apply 1 application topically 2 (two) times daily as needed. 1 each 3  . triamcinolone cream (KENALOG) 0.1 % APPLY EXTERNALLY TO THE AFFECTED AREA TWICE DAILY. AVOID FACE 80 g 0  . Vitamin D, Ergocalciferol, (DRISDOL) 1.25 MG (50000 UT) CAPS capsule TAKE 1 CAPSULE BY MOUTH EVERY 7 DAYS 12 capsule 0  . zolpidem (AMBIEN) 10 MG tablet Take 1 tablet (10 mg total) by mouth at bedtime as needed for sleep. 30 tablet 1   No current facility-administered medications for this visit.     Past Medical History:  Diagnosis Date  . Arthritis   .  Heart disease   . Hyperlipidemia   . Hypertension   . PUD (peptic ulcer disease)   . Thyroid disease     Past Surgical History:  Procedure Laterality Date  . AORTIC VALVE REPLACEMENT    . TOTAL ABDOMINAL HYSTERECTOMY      Social History   Socioeconomic History  . Marital status: Married    Spouse name: Not on file  . Number of children: 2  . Years of education: Not on file  . Highest education level: Not on file  Occupational History  . Not on file  Tobacco Use  . Smoking status: Current Every Day Smoker    Types: E-cigarettes    Last attempt to quit: 05/30/2013    Years since quitting: 6.4  .  Smokeless tobacco: Never Used  Substance and Sexual Activity  . Alcohol use: No    Alcohol/week: 0.0 standard drinks  . Drug use: No  . Sexual activity: Not Currently  Other Topics Concern  . Not on file  Social History Narrative  . Not on file   Social Determinants of Health   Financial Resource Strain:   . Difficulty of Paying Living Expenses: Not on file  Food Insecurity:   . Worried About Charity fundraiser in the Last Year: Not on file  . Ran Out of Food in the Last Year: Not on file  Transportation Needs:   . Lack of Transportation (Medical): Not on file  . Lack of Transportation (Non-Medical): Not on file  Physical Activity:   . Days of Exercise per Week: Not on file  . Minutes of Exercise per Session: Not on file  Stress:   . Feeling of Stress : Not on file  Social Connections:   . Frequency of Communication with Friends and Family: Not on file  . Frequency of Social Gatherings with Friends and Family: Not on file  . Attends Religious Services: Not on file  . Active Member of Clubs or Organizations: Not on file  . Attends Archivist Meetings: Not on file  . Marital Status: Not on file  Intimate Partner Violence:   . Fear of Current or Ex-Partner: Not on file  . Emotionally Abused: Not on file  . Physically Abused: Not on file  . Sexually Abused: Not on file    Family History  Problem Relation Age of Onset  . Cancer Other   . Depression Other   . Diabetes Other   . Hypertension Other   . Breast cancer Other     ROS: no fevers or chills, productive cough, hemoptysis, dysphasia, odynophagia, melena, hematochezia, dysuria, hematuria, rash, seizure activity, orthopnea, PND, pedal edema, claudication. Remaining systems are negative.  Physical Exam: Well-developed well-nourished in no acute distress.  Skin is warm and dry.  HEENT is normal.  Neck is supple.  Chest is clear to auscultation with normal expansion.  Cardiovascular exam is regular rate  and rhythm.  Abdominal exam nontender or distended. No masses palpated. Extremities show no edema. neuro grossly intact  ECG- personally reviewed  A/P  1 prior aortic valve replacement-patient doing well from a symptomatic standpoint.  Continue SBE prophylaxis.  2 hyperlipidemia-continue statin.  3 hypertension-patient's blood pressure is controlled.  Continue present medical regimen.  Kirk Ruths, MD

## 2019-10-30 ENCOUNTER — Ambulatory Visit: Payer: Medicaid Other | Admitting: Cardiology

## 2019-10-30 ENCOUNTER — Other Ambulatory Visit: Payer: Self-pay | Admitting: Physician Assistant

## 2019-11-04 ENCOUNTER — Telehealth: Payer: Self-pay | Admitting: Physician Assistant

## 2019-11-04 NOTE — Telephone Encounter (Signed)
Rachael Douglas called this afternoon to request refill on Ambien (10mg  tablets). She stated that she only had one left.

## 2019-11-04 NOTE — Telephone Encounter (Signed)
Called patient, made her aware this was sent on 10/08/2019 with a refill. She will contact pharmacy. Will call with any further questions.

## 2019-11-21 ENCOUNTER — Telehealth: Payer: Self-pay | Admitting: Neurology

## 2019-11-21 NOTE — Telephone Encounter (Signed)
Patient called to see if Rachael Douglas would recommend Covid vaccine for her. Made her aware Rachael Douglas is recommending Covid vaccine for all her patients. Patient will call back with any issues.

## 2019-11-22 ENCOUNTER — Telehealth: Payer: Self-pay | Admitting: Neurology

## 2019-11-22 MED ORDER — HYDROXYZINE HCL 10 MG PO TABS: 10.0000 mg | ORAL_TABLET | Freq: Three times a day (TID) | ORAL | 1 refills | Status: DC | PRN

## 2019-11-22 NOTE — Telephone Encounter (Signed)
RX sent. Patient made aware.  

## 2019-11-22 NOTE — Telephone Encounter (Signed)
Patient called and left vm asking if she could be prescribed Vistaril. She states she has used in the past for anxiety. Buspar isn't helping. Please advise.

## 2019-11-22 NOTE — Telephone Encounter (Signed)
Ok to start vistaril 10mg  up to three times a day as needed for anxiety. #90 refill 1 follow up in 6 weeks.

## 2019-12-03 ENCOUNTER — Other Ambulatory Visit: Payer: Self-pay

## 2019-12-03 MED ORDER — ONDANSETRON 4 MG PO TBDP: 4.0000 mg | ORAL_TABLET | Freq: Three times a day (TID) | ORAL | 1 refills | Status: DC | PRN

## 2019-12-03 NOTE — Telephone Encounter (Signed)
Rachael Douglas would like a refill on Zofran. Never prescribed by Lesly Rubenstein.

## 2019-12-04 ENCOUNTER — Other Ambulatory Visit: Payer: Self-pay

## 2019-12-04 DIAGNOSIS — L308 Other specified dermatitis: Secondary | ICD-10-CM | POA: Diagnosis not present

## 2019-12-04 DIAGNOSIS — R21 Rash and other nonspecific skin eruption: Secondary | ICD-10-CM | POA: Diagnosis not present

## 2019-12-04 MED ORDER — ZOLPIDEM TARTRATE 10 MG PO TABS: 10.0000 mg | ORAL_TABLET | Freq: Every evening | ORAL | 1 refills | Status: DC | PRN

## 2019-12-13 DIAGNOSIS — Z4802 Encounter for removal of sutures: Secondary | ICD-10-CM | POA: Diagnosis not present

## 2019-12-13 DIAGNOSIS — R21 Rash and other nonspecific skin eruption: Secondary | ICD-10-CM | POA: Diagnosis not present

## 2019-12-13 NOTE — Progress Notes (Signed)
Virtual Visit via Video Note changed to phone visit at patient request   This visit type was conducted due to national recommendations for restrictions regarding the COVID-19 Pandemic (e.g. social distancing) in an effort to limit this patient's exposure and mitigate transmission in our community.  Due to her co-morbid illnesses, this patient is at least at moderate risk for complications without adequate follow up.  This format is felt to be most appropriate for this patient at this time.  All issues noted in this document were discussed and addressed.  A limited physical exam was performed with this format.  Please refer to the patient's chart for her consent to telehealth for [facility].      HPI: FU aortic valve replacement. Previously followed at [facility]. Had bioprosthetic aortic valve replacement in 2014. Apparently had no obstructive coronary disease preoperatively. She has had a previous monitor for palpitations that was normal by report.Echocardiogram December 2018 showed normal LV systolic function, bioprosthetic aortic valve with mean gradient 17 mmHg.  Carotid Dopplers December 2018 showed no significant stenosis. Left subclavian flow was disturbed.  Since last seen,  she denies dyspnea, chest pain, palpitations or syncope.  Current Outpatient Medications  Medication Sig Dispense Refill  . albuterol (PROAIR HFA) 108 (90 BASE) MCG/ACT inhaler INHALE 2 PUFFS EVERY 6 HOURS AS NEEDED    . aspirin EC 81 MG tablet Take 1 tablet (81 mg total) by mouth daily.    [name] azithromycin (ZITHROMAX) 250 MG tablet Take 2 tablets now and then one tablet for 4 days. 6 tablet 0  . clobetasol cream (TEMOVATE) 1.69 % Apply 1 application topically 2 (two) times daily. 15 g 0  . cyclobenzaprine (FLEXERIL) 10 MG tablet TAKE 1 TABLET(10 MG) BY MOUTH THREE TIMES DAILY 90 tablet 2  . diclofenac sodium (VOLTAREN) 1 % GEL Apply 4 g topically 4 (four) times daily. To affected joint. 100 g 1  . donepezil  (ARICEPT) 10 MG tablet Take 0.5 tablets (5 mg total) by mouth at bedtime. 30 tablet 3  . DULoxetine (CYMBALTA) 60 MG capsule TAKE 2 CAPSULES(120 MG) BY MOUTH DAILY 60 capsule 5  . furosemide (LASIX) 40 MG tablet TAKE 1 TABLET(40 MG) BY MOUTH DAILY AS NEEDED FOR SWELLING 90 tablet 1  . gabapentin (NEURONTIN) 600 MG tablet TAKE 2 TABLETS(1200 MG) BY MOUTH THREE TIMES DAILY 540 tablet 1  . hydrOXYzine (ATARAX/VISTARIL) 10 MG tablet Take 1 tablet (10 mg total) by mouth 3 (three) times daily as needed. 90 tablet 1  . levothyroxine (SYNTHROID) 75 MCG tablet Take 1 tablet (75 mcg total) by mouth daily before breakfast. 90 tablet 3  . linaclotide (LINZESS) 290 MCG CAPS capsule Take 1 capsule (290 mcg total) by mouth daily. 90 capsule 1  . metoprolol tartrate (LOPRESSOR) 25 MG tablet Take 1 tablet (25 mg total) by mouth 2 (two) times daily. 180 tablet 1  . mometasone-formoterol (DULERA) 200-5 MCG/ACT AERO Inhale 1 puff into the lungs daily. 3 Inhaler 3  . Omega 3 1200 MG CAPS Take by mouth.    . ondansetron (ZOFRAN ODT) 4 MG disintegrating tablet Take 1 tablet (4 mg total) by mouth every 8 (eight) hours as needed for nausea or vomiting. 12 tablet 1  . pantoprazole (PROTONIX) 40 MG tablet Take 1 tablet (40 mg total) by mouth daily. 90 tablet 1  . potassium chloride (KLOR-CON) 10 MEQ tablet TAKE 1 TABLET(10 MEQ) BY MOUTH DAILY 90 tablet 1  . pravastatin (PRAVACHOL) 80 MG tablet TAKE 1 TABLET(80  MG) BY MOUTH DAILY 90 tablet 3  . Triamcinolone Acetonide (TRIAMCINOLONE 0.1 % CREAM : EUCERIN) CREA Apply 1 application topically 2 (two) times daily as needed. 1 each 3  . triamcinolone cream (KENALOG) 0.1 % APPLY EXTERNALLY TO THE AFFECTED AREA TWICE DAILY. AVOID FACE 80 g 0  . Vitamin D, Ergocalciferol, (DRISDOL) 1.25 MG (50000 UT) CAPS capsule TAKE 1 CAPSULE BY MOUTH EVERY 7 DAYS 12 capsule 0  . zolpidem (AMBIEN) 10 MG tablet Take 1 tablet (10 mg total) by mouth at bedtime as needed for sleep. 30 tablet 1   No  current facility-administered medications for this visit.     Past Medical History:  Diagnosis Date  . Arthritis   . Heart disease   . Hyperlipidemia   . Hypertension   . PUD (peptic ulcer disease)   . Thyroid disease     Past Surgical History:  Procedure Laterality Date  . AORTIC VALVE REPLACEMENT    . TOTAL ABDOMINAL HYSTERECTOMY      Social History   Socioeconomic History  . Marital status: Married    Spouse name: Not on file  . Number of children: 2  . Years of education: Not on file  . Highest education level: Not on file  Occupational History  . Not on file  Tobacco Use  . Smoking status: Current Every Day Smoker    Types: E-cigarettes    Last attempt to quit: 05/30/2013    Years since quitting: 6.5  . Smokeless tobacco: Never Used  Substance and Sexual Activity  . Alcohol use: No    Alcohol/week: 0.0 standard drinks  . Drug use: No  . Sexual activity: Not Currently  Other Topics Concern  . Not on file  Social History Narrative  . Not on file   Social Determinants of Health   Financial Resource Strain:   . Difficulty of Paying Living Expenses:   Food Insecurity:   . Worried About Programme researcher, broadcasting/film/video in the Last Year:   . Barista in the Last Year:   Transportation Needs:   . Freight forwarder (Medical):   [name] Lack of Transportation (Non-Medical):   Physical Activity:   . Days of Exercise per Week:   . Minutes of Exercise per Session:   Stress:   . Feeling of Stress :   Social Connections:   . Frequency of Communication with Friends and Family:   . Frequency of Social Gatherings with Friends and Family:   . Attends Religious Services:   . Active Member of Clubs or Organizations:   . Attends Banker Meetings:   [name] Marital Status:   Intimate Partner Violence:   . Fear of Current or Ex-Partner:   . Emotionally Abused:   [name] Physically Abused:   . Sexually Abused:     Family History  Problem Relation Age of Onset  .  Cancer Other   . Depression Other   . Diabetes Other   . Hypertension Other   . Breast cancer Other     ROS: Patient describes arthralgias.  No fevers or chills, productive cough, hemoptysis, dysphasia, odynophagia, melena, hematochezia, dysuria, hematuria, rash, seizure activity, orthopnea, PND, pedal edema, claudication. Remaining systems are negative.  Physical Exam: No acute distress Answers questions appropriately Normal affect Remainder physical examination not performed (coronavirus pandemic).   A/P  1 prior aortic valve replacement-patient doing well with no symptoms of increasing dyspnea.  Continue SBE prophylaxis.  2 hypertension-patient's blood pressure is controlled.  Continue present medical regimen.  3 hyperlipidemia-continue statin.  COVID-19 Education: The importance of social distancing was discussed today.  Time:   Today, I have spent 16 minutes with the patient with telehealth technology discussing the above problems.     Medication Adjustments/Labs and Tests Ordered: Current medicines are reviewed at length with the patient today.  Concerns regarding medicines are outlined above.   Tests Ordered: No orders of the defined types were placed in this encounter.   Medication Changes: No orders of the defined types were placed in this encounter.   Follow Up:  Either In Person or Virtual in 1 year(s)  Signed, [name], MD  12/18/2019 8:08 AM    [facility]

## 2019-12-15 ENCOUNTER — Other Ambulatory Visit: Payer: Self-pay | Admitting: Physician Assistant

## 2019-12-16 ENCOUNTER — Ambulatory Visit (INDEPENDENT_AMBULATORY_CARE_PROVIDER_SITE_OTHER): Payer: Medicare Other | Admitting: Nurse Practitioner

## 2019-12-16 ENCOUNTER — Other Ambulatory Visit: Payer: Self-pay

## 2019-12-16 ENCOUNTER — Encounter: Payer: Self-pay | Admitting: Nurse Practitioner

## 2019-12-16 VITALS — BP 133/79 | HR 66 | Temp 98.0°F | Ht 64.0 in | Wt 127.1 lb

## 2019-12-16 DIAGNOSIS — R300 Dysuria: Secondary | ICD-10-CM | POA: Diagnosis not present

## 2019-12-16 DIAGNOSIS — L309 Dermatitis, unspecified: Secondary | ICD-10-CM

## 2019-12-16 DIAGNOSIS — R197 Diarrhea, unspecified: Secondary | ICD-10-CM

## 2019-12-16 LAB — POCT URINALYSIS DIP (CLINITEK)
Blood, UA: NEGATIVE
Glucose, UA: NEGATIVE mg/dL
Ketones, POC UA: NEGATIVE mg/dL
Leukocytes, UA: NEGATIVE
Nitrite, UA: NEGATIVE
POC PROTEIN,UA: 30 — AB
Spec Grav, UA: >=1.03 — AB (ref 1.010–1.025)
Urobilinogen, UA: 0.2 E.U./dL
pH, UA: 5.5 (ref 5.0–8.0)

## 2019-12-16 MED ORDER — FLUCONAZOLE 150 MG PO TABS: 150.0000 mg | ORAL_TABLET | Freq: Once | ORAL | 0 refills | Status: AC

## 2019-12-16 MED ORDER — PHENAZOPYRIDINE HCL 200 MG PO TABS: 200.0000 mg | ORAL_TABLET | Freq: Three times a day (TID) | ORAL | 0 refills | Status: AC

## 2019-12-16 MED ORDER — TRIAMCINOLONE ACETONIDE 0.1 % EX CREA: TOPICAL_CREAM | CUTANEOUS | 1 refills | Status: DC

## 2019-12-16 MED ORDER — NITROFURANTOIN MONOHYD MACRO 100 MG PO CAPS: 100.0000 mg | ORAL_CAPSULE | Freq: Two times a day (BID) | ORAL | 0 refills | Status: DC

## 2019-12-16 NOTE — Progress Notes (Signed)
Acute Office Visit  Subjective:    Patient ID: Rachael Douglas, female    DOB: 08/23/1956, 64 y.o.   MRN: [id number]  Chief Complaint  Patient presents with  . Dysuria    Onset:3d::suprapubic pressure, burning, frequency, odor, urgency, hesitancy, has not tried taking anything     HPI Patient is in today for sudden onset of urinary urgency, frequency, odor, hesitation, burning, chills and loose stools that began this past Saturday.  She reports she does have a history of urinary tract infections and this feels similar to symptoms she has had in the past.  She has not taken anything for her symptoms and she reports that they appear to be worsening as time goes on.  The patient would also like to discuss a refill of her triamcinolone for her lower extremity eczema.  She reports she had a recent biopsy to the area but dermatology has been unable to give her a clear definitive answer on the exact diagnosis.  She does report that the triamcinolone helped significantly with the irritation and redness and is the only thing that seems to be effective for the rash.  Past Medical History:  Diagnosis Date  . Arthritis   . Heart disease   . Hyperlipidemia   . Hypertension   . PUD (peptic ulcer disease)   . Thyroid disease     Past Surgical History:  Procedure Laterality Date  . AORTIC VALVE REPLACEMENT    . TOTAL ABDOMINAL HYSTERECTOMY      Family History  Problem Relation Age of Onset  . Cancer Other   . Depression Other   . Diabetes Other   . Hypertension Other   . Breast cancer Other     Social History   Socioeconomic History  . Marital status: Married    Spouse name: Not on file  . Number of children: 2  . Years of education: Not on file  . Highest education level: Not on file  Occupational History  . Not on file  Tobacco Use  . Smoking status: Current Every Day Smoker    Types: E-cigarettes    Last attempt to quit: 05/30/2013    Years since quitting: 6.5  . Smokeless  tobacco: Never Used  Substance and Sexual Activity  . Alcohol use: No    Alcohol/week: 0.0 standard drinks  . Drug use: No  . Sexual activity: Not Currently  Other Topics Concern  . Not on file  Social History Narrative  . Not on file   Social Determinants of Health   Financial Resource Strain:   . Difficulty of Paying Living Expenses:   Food Insecurity:   . Worried About Programme researcher, broadcasting/film/video in the Last Year:   . Barista in the Last Year:   Transportation Needs:   . Freight forwarder (Medical):   [name] Lack of Transportation (Non-Medical):   Physical Activity:   . Days of Exercise per Week:   . Minutes of Exercise per Session:   Stress:   . Feeling of Stress :   Social Connections:   . Frequency of Communication with Friends and Family:   . Frequency of Social Gatherings with Friends and Family:   . Attends Religious Services:   . Active Member of Clubs or Organizations:   . Attends Banker Meetings:   [name] Marital Status:   Intimate Partner Violence:   . Fear of Current or Ex-Partner:   . Emotionally Abused:   [name] Physically Abused:   .  Sexually Abused:     Outpatient Medications Prior to Visit  Medication Sig Dispense Refill  . albuterol (PROAIR HFA) 108 (90 BASE) MCG/ACT inhaler INHALE 2 PUFFS EVERY 6 HOURS AS NEEDED    . clobetasol cream (TEMOVATE) 8.18 % Apply 1 application topically 2 (two) times daily. 15 g 0  . cyclobenzaprine (FLEXERIL) 10 MG tablet TAKE 1 TABLET(10 MG) BY MOUTH THREE TIMES DAILY 90 tablet 2  . diclofenac sodium (VOLTAREN) 1 % GEL Apply 4 g topically 4 (four) times daily. To affected joint. 100 g 1  . donepezil (ARICEPT) 10 MG tablet Take 0.5 tablets (5 mg total) by mouth at bedtime. 30 tablet 3  . DULoxetine (CYMBALTA) 60 MG capsule TAKE 2 CAPSULES(120 MG) BY MOUTH DAILY 60 capsule 5  . furosemide (LASIX) 40 MG tablet TAKE 1 TABLET(40 MG) BY MOUTH DAILY AS NEEDED FOR SWELLING 90 tablet 1  . gabapentin (NEURONTIN) 600 MG  tablet TAKE 2 TABLETS(1200 MG) BY MOUTH THREE TIMES DAILY 540 tablet 1  . hydrOXYzine (ATARAX/VISTARIL) 10 MG tablet Take 1 tablet (10 mg total) by mouth 3 (three) times daily as needed. (Patient taking differently: Take 25 mg by mouth 3 (three) times daily as needed. ) 90 tablet 1  . levothyroxine (SYNTHROID) 75 MCG tablet Take 1 tablet (75 mcg total) by mouth daily before breakfast. 90 tablet 3  . linaclotide (LINZESS) 290 MCG CAPS capsule Take 1 capsule (290 mcg total) by mouth daily. 90 capsule 1  . metoprolol tartrate (LOPRESSOR) 25 MG tablet Take 1 tablet (25 mg total) by mouth 2 (two) times daily. 180 tablet 1  . mometasone-formoterol (DULERA) 200-5 MCG/ACT AERO Inhale 1 puff into the lungs daily. 3 Inhaler 3  . ondansetron (ZOFRAN ODT) 4 MG disintegrating tablet Take 1 tablet (4 mg total) by mouth every 8 (eight) hours as needed for nausea or vomiting. 12 tablet 1  . oxyCODONE-acetaminophen (PERCOCET) 10-325 MG tablet Take 1 tablet by mouth every 6 (six) hours as needed.    . pantoprazole (PROTONIX) 40 MG tablet Take 1 tablet (40 mg total) by mouth daily. 90 tablet 1  . potassium chloride (KLOR-CON) 10 MEQ tablet TAKE 1 TABLET(10 MEQ) BY MOUTH DAILY 90 tablet 1  . pravastatin (PRAVACHOL) 80 MG tablet TAKE 1 TABLET(80 MG) BY MOUTH DAILY 90 tablet 3  . Triamcinolone Acetonide (TRIAMCINOLONE 0.1 % CREAM : EUCERIN) CREA Apply 1 application topically 2 (two) times daily as needed. 1 each 3  . triamcinolone cream (KENALOG) 0.1 % APPLY EXTERNALLY TO THE AFFECTED AREA TWICE DAILY. AVOID FACE 80 g 0  . Vitamin D, Ergocalciferol, (DRISDOL) 1.25 MG (50000 UT) CAPS capsule TAKE 1 CAPSULE BY MOUTH EVERY 7 DAYS 12 capsule 0  . zolpidem (AMBIEN) 10 MG tablet Take 1 tablet (10 mg total) by mouth at bedtime as needed for sleep. 30 tablet 1  . aspirin EC 81 MG tablet Take 1 tablet (81 mg total) by mouth daily. (Patient not taking: Reported on 12/16/2019)    . Omega 3 1200 MG CAPS Take by mouth.    [name]  azithromycin (ZITHROMAX) 250 MG tablet Take 2 tablets now and then one tablet for 4 days. (Patient not taking: Reported on 12/16/2019) 6 tablet 0   No facility-administered medications prior to visit.    Allergies  Allergen Reactions  . Penicillins Swelling  . Ranitidine Other (See Comments)    Facial swelling  . Alendronate Other (See Comments)    unknown  . Bisphosphonates     Nausea/stomach  pains.     Review of Systems  Constitutional: Positive for chills. Negative for appetite change, fatigue, fever and unexpected weight change.  Respiratory: Negative for chest tightness and shortness of breath.   Cardiovascular: Negative for chest pain and palpitations.  Gastrointestinal: Positive for abdominal pain and diarrhea. Negative for abdominal distention, anal bleeding, blood in stool, constipation, nausea and vomiting.  Genitourinary: Positive for decreased urine volume, difficulty urinating, dysuria, frequency, pelvic pain and urgency. Negative for dyspareunia, flank pain, genital sores, hematuria, vaginal bleeding, vaginal discharge and vaginal pain.  Musculoskeletal: Positive for back pain.      Objective:    Physical Exam Vitals and nursing note reviewed.  Constitutional:      Appearance: Normal appearance.  HENT:     Head: Normocephalic.  Eyes:     Extraocular Movements: Extraocular movements intact.     Conjunctiva/sclera: Conjunctivae normal.     Pupils: Pupils are equal, round, and reactive to light.  Cardiovascular:     Rate and Rhythm: Normal rate and regular rhythm.     Pulses: Normal pulses.     Heart sounds: Normal heart sounds.  Pulmonary:     Effort: Pulmonary effort is normal.     Breath sounds: Wheezing present.  Abdominal:     General: Abdomen is flat. Bowel sounds are normal. There is no distension.     Palpations: Abdomen is soft. There is no shifting dullness, fluid wave, hepatomegaly, splenomegaly, mass or pulsatile mass.     Tenderness: There is  abdominal tenderness in the suprapubic area. There is no right CVA tenderness, left CVA tenderness, guarding or rebound.  Musculoskeletal:        General: Normal range of motion.     Cervical back: Normal range of motion.  Skin:    General: Skin is warm and dry.     Capillary Refill: Capillary refill takes less than 2 seconds.  Neurological:     General: No focal deficit present.     Mental Status: She is alert and oriented to person, place, and time.  Psychiatric:        Mood and Affect: Mood normal.        Behavior: Behavior normal.        Thought Content: Thought content normal.        Judgment: Judgment normal.     BP 133/79   Pulse 66   Temp 98 F (36.7 C) (Oral)   Ht 5\' 4"  (1.626 m)   Wt 127 lb 1.9 oz (57.7 kg)   SpO2 100%   BMI 21.82 kg/m  Wt Readings from Last 3 Encounters:  12/16/19 127 lb 1.9 oz (57.7 kg)  10/01/19 124 lb (56.2 kg)  08/13/19 132 lb (59.9 kg)    Health Maintenance Due  Topic Date Due  . PNA vac Low Risk Adult (2 of 2 - PPSV23) 12/07/2019    There are no preventive care reminders to display for this patient.   Lab Results  Component Value Date   TSH 0.75 02/21/2019   Lab Results  Component Value Date   WBC 6.9 07/24/2019   HGB 13.8 07/24/2019   HCT 42.4 07/24/2019   MCV 84.1 07/24/2019   PLT 299 07/24/2019   Lab Results  Component Value Date   NA 141 02/21/2019   K 4.2 02/21/2019   CO2 33 (H) 02/21/2019   GLUCOSE 90 02/21/2019   BUN 12 02/21/2019   CREATININE 0.91 02/21/2019   BILITOT 0.5 02/21/2019   ALKPHOS 61  12/07/2016   AST 13 02/21/2019   ALT 7 02/21/2019   PROT 6.9 02/21/2019   ALBUMIN 4.1 12/07/2016   CALCIUM 9.2 02/21/2019   Lab Results  Component Value Date   CHOL 257 (H) 02/21/2019   Lab Results  Component Value Date   HDL 59 02/21/2019   Lab Results  Component Value Date   LDLCALC 174 (H) 02/21/2019   Lab Results  Component Value Date   TRIG 114 02/21/2019   Lab Results  Component Value Date    CHOLHDL 4.4 02/21/2019   No results found for: HGBA1C     Assessment & Plan:   1. Dysuria Dysuria with suspected cystitis based on symptoms and presentation.  Review of past history reveals patient has had normal-appearing point-of-care urinalysis w with lower urinary tract symptoms which reveals positive E. coli urine culture. Based on patient's history and current symptoms we will begin treatment with nitrofurantoin prophylactically.  Prescription for Pyridium also prescribed for relief of lower urinary tract symptoms.  Prescription for Diflucan sent for yeast infection which is often associated with antibiotic use for this patient. Instructed the patient to drink plenty of fluids and complete all of the antibiotic therapy unless otherwise told by a healthcare provider. Patient to follow-up if symptoms worsen or fail to improve. - POCT URINALYSIS DIP (CLINITEK) - nitrofurantoin, macrocrystal-monohydrate, (MACROBID) 100 MG capsule; Take 1 capsule (100 mg total) by mouth 2 (two) times daily.  Dispense: 10 capsule; Refill: 0 - fluconazole (DIFLUCAN) 150 MG tablet; Take 1 tablet (150 mg total) by mouth once for 1 dose.  Dispense: 1 tablet; Refill: 0 - phenazopyridine (PYRIDIUM) 200 MG tablet; Take 1 tablet (200 mg total) by mouth 3 (three) times daily for 2 days.  Dispense: 6 tablet; Refill: 0  3. Diarrhea, unspecified type Patient experiencing intermittent loose stools most likely associated with lower urinary tract infection/symptoms. Directed the patient to maintain hydration and notify the office if the symptoms persist or begin to worsen.  4. Eczema, unspecified type Refill provided on triamcinolone cream for bilateral lower extremity eczematous rash. Instructed the patient to use the medication sparingly and avoid any kind of application to the face. - triamcinolone cream (KENALOG) 0.1 %; APPLY EXTERNALLY TO THE AFFECTED AREA TWICE DAILY. AVOID FACE  Dispense: 80 g; Refill: 1   Tollie Eth, NP

## 2019-12-16 NOTE — Patient Instructions (Signed)
Urinary Tract Infection, Adult A urinary tract infection (UTI) is an infection of any part of the urinary tract. The urinary tract includes:  The kidneys.  The ureters.  The bladder.  The urethra. These organs make, store, and get rid of pee (urine) in the body. What are the causes? This is caused by germs (bacteria) in your genital area. These germs grow and cause swelling (inflammation) of your urinary tract. What increases the risk? You are more likely to develop this condition if:  You have a small, thin tube (catheter) to drain pee.  You cannot control when you pee or poop (incontinence).  You are female, and: ? You use these methods to prevent pregnancy:  A medicine that kills sperm (spermicide).  A device that blocks sperm (diaphragm). ? You have low levels of a female hormone (estrogen). ? You are pregnant.  You have genes that add to your risk.  You are sexually active.  You take antibiotic medicines.  You have trouble peeing because of: ? A prostate that is bigger than normal, if you are female. ? A blockage in the part of your body that drains pee from the bladder (urethra). ? A kidney stone. ? A nerve condition that affects your bladder (neurogenic bladder). ? Not getting enough to drink. ? Not peeing often enough.  You have other conditions, such as: ? Diabetes. ? A weak disease-fighting system (immune system). ? Sickle cell disease. ? Gout. ? Injury of the spine. What are the signs or symptoms? Symptoms of this condition include:  Needing to pee right away (urgently).  Peeing often.  Peeing small amounts often.  Pain or burning when peeing.  Blood in the pee.  Pee that smells bad or not like normal.  Trouble peeing.  Pee that is cloudy.  Fluid coming from the vagina, if you are female.  Pain in the belly or lower back. Other symptoms include:  Throwing up (vomiting).  No urge to eat.  Feeling mixed up (confused).  Being tired  and grouchy (irritable).  A fever.  Watery poop (diarrhea). How is this treated? This condition may be treated with:  Antibiotic medicine.  Other medicines.  Drinking enough water. Follow these instructions at home:  Medicines  Take over-the-counter and prescription medicines only as told by your doctor.  If you were prescribed an antibiotic medicine, take it as told by your doctor. Do not stop taking it even if you start to feel better. General instructions  Make sure you: ? Pee until your bladder is empty. ? Do not hold pee for a long time. ? Empty your bladder after sex. ? Wipe from front to back after pooping if you are a female. Use each tissue one time when you wipe.  Drink enough fluid to keep your pee pale yellow.  Keep all follow-up visits as told by your doctor. This is important. Contact a doctor if:  You do not get better after 1-2 days.  Your symptoms go away and then come back. Get help right away if:  You have very bad back pain.  You have very bad pain in your lower belly.  You have a fever.  You are sick to your stomach (nauseous).  You are throwing up. Summary  A urinary tract infection (UTI) is an infection of any part of the urinary tract.  This condition is caused by germs in your genital area.  There are many risk factors for a UTI. These include having a small, thin   tube to drain pee and not being able to control when you pee or poop.  Treatment includes antibiotic medicines for germs.  Drink enough fluid to keep your pee pale yellow. This information is not intended to replace advice given to you by your health care provider. Make sure you discuss any questions you have with your health care provider. Document Revised: 08/23/2018 Document Reviewed: 03/15/2018 Elsevier Patient Education  2020 [employer]  

## 2019-12-18 ENCOUNTER — Encounter: Payer: Self-pay | Admitting: Cardiology

## 2019-12-18 ENCOUNTER — Other Ambulatory Visit: Payer: Self-pay

## 2019-12-18 ENCOUNTER — Telehealth (INDEPENDENT_AMBULATORY_CARE_PROVIDER_SITE_OTHER): Payer: Medicare Other | Admitting: Cardiology

## 2019-12-18 VITALS — BP 100/67 | HR 66 | Ht 64.0 in | Wt 125.0 lb

## 2019-12-18 DIAGNOSIS — Z952 Presence of prosthetic heart valve: Secondary | ICD-10-CM | POA: Diagnosis not present

## 2019-12-18 DIAGNOSIS — E785 Hyperlipidemia, unspecified: Secondary | ICD-10-CM | POA: Diagnosis not present

## 2019-12-18 DIAGNOSIS — F17290 Nicotine dependence, other tobacco product, uncomplicated: Secondary | ICD-10-CM | POA: Diagnosis not present

## 2019-12-18 DIAGNOSIS — E7800 Pure hypercholesterolemia, unspecified: Secondary | ICD-10-CM

## 2019-12-18 DIAGNOSIS — I10 Essential (primary) hypertension: Secondary | ICD-10-CM | POA: Diagnosis not present

## 2019-12-18 DIAGNOSIS — Z7189 Other specified counseling: Secondary | ICD-10-CM

## 2019-12-18 NOTE — Patient Instructions (Signed)
Medication Instructions:  NO CHANGE *If you need a refill on your cardiac medications before your next appointment, please call your pharmacy*   Lab Work: If you have labs (blood work) drawn today and your tests are completely normal, you will receive your results only by: . MyChart Message (if you have MyChart) OR . A paper copy in the mail If you have any lab test that is abnormal or we need to change your treatment, we will call you to review the results.  Follow-Up: At [facility], you and your health needs are our priority.  As part of our continuing mission to provide you with exceptional heart care, we have created designated Provider Care Teams.  These Care Teams include your primary Cardiologist (physician) and Advanced Practice Providers (APPs -  Physician Assistants and Nurse Practitioners) who all work together to provide you with the care you need, when you need it.  We recommend signing up for the patient portal called "MyChart".  Sign up information is provided on this After Visit Summary.  MyChart is used to connect with patients for Virtual Visits (Telemedicine).  Patients are able to view lab/test results, encounter notes, upcoming appointments, etc.  Non-urgent messages can be sent to your provider as well.   To learn more about what you can do with MyChart, go to [email or web address].    Your next appointment:   12 month(s)  The format for your next appointment:   Either In Person or Virtual  Provider:   Brian Crenshaw, MD    

## 2019-12-21 ENCOUNTER — Ambulatory Visit: Payer: Medicare Other | Attending: Internal Medicine

## 2019-12-21 ENCOUNTER — Ambulatory Visit: Payer: Self-pay

## 2019-12-21 DIAGNOSIS — Z23 Encounter for immunization: Secondary | ICD-10-CM

## 2019-12-21 NOTE — Progress Notes (Signed)
   Covid-19 Vaccination Clinic  Name:  Rachael Douglas    MRN: [id number] DOB: [birth year]  12/21/2019  [name] was observed post Covid-19 immunization for 15 minutes without incident. She was provided with Vaccine Information Sheet and instruction to access the V-Safe system.   [name] was instructed to call 911 with any severe reactions post vaccine: [name] Difficulty breathing  . Swelling of face and throat  . A fast heartbeat  . A bad rash all over body  . Dizziness and weakness   Immunizations Administered    Name Date Dose VIS Date Route   Pfizer COVID-19 Vaccine 12/21/2019  1:24 PM 0.3 mL 08/30/2019 Intramuscular   Manufacturer: [employer], [employer]   Lot: [id number]   NDC: [address]-2

## 2019-12-26 ENCOUNTER — Other Ambulatory Visit: Payer: Self-pay | Admitting: Physician Assistant

## 2019-12-26 DIAGNOSIS — K5900 Constipation, unspecified: Secondary | ICD-10-CM

## 2019-12-31 ENCOUNTER — Ambulatory Visit (INDEPENDENT_AMBULATORY_CARE_PROVIDER_SITE_OTHER): Payer: Medicare Other | Admitting: Physician Assistant

## 2019-12-31 DIAGNOSIS — Z5329 Procedure and treatment not carried out because of patient's decision for other reasons: Secondary | ICD-10-CM

## 2019-12-31 NOTE — Progress Notes (Signed)
No show

## 2020-01-04 ENCOUNTER — Other Ambulatory Visit: Payer: Self-pay | Admitting: Physician Assistant

## 2020-01-04 DIAGNOSIS — R413 Other amnesia: Secondary | ICD-10-CM

## 2020-01-08 DIAGNOSIS — M5136 Other intervertebral disc degeneration, lumbar region: Secondary | ICD-10-CM | POA: Diagnosis not present

## 2020-01-08 DIAGNOSIS — M47816 Spondylosis without myelopathy or radiculopathy, lumbar region: Secondary | ICD-10-CM | POA: Diagnosis not present

## 2020-01-08 DIAGNOSIS — Z79891 Long term (current) use of opiate analgesic: Secondary | ICD-10-CM | POA: Diagnosis not present

## 2020-01-08 DIAGNOSIS — M5032 Other cervical disc degeneration, mid-cervical region, unspecified level: Secondary | ICD-10-CM | POA: Diagnosis not present

## 2020-01-08 DIAGNOSIS — M461 Sacroiliitis, not elsewhere classified: Secondary | ICD-10-CM | POA: Diagnosis not present

## 2020-01-11 ENCOUNTER — Ambulatory Visit: Payer: Medicare Other | Attending: Internal Medicine

## 2020-01-11 DIAGNOSIS — Z23 Encounter for immunization: Secondary | ICD-10-CM

## 2020-01-11 NOTE — Progress Notes (Signed)
   Covid-19 Vaccination Clinic  Name:  Rachael Douglas    MRN: [id number] DOB: [birth year]  01/11/2020  Rachael Douglas was observed post Covid-19 immunization for 15 minutes without incident. She was provided with Vaccine Information Sheet and instruction to access the V-Safe system.   Rachael Douglas was instructed to call 911 with any severe reactions post vaccine: [name] Difficulty breathing  . Swelling of face and throat  . A fast heartbeat  . A bad rash all over body  . Dizziness and weakness   Immunizations Administered    Name Date Dose VIS Date Route   Pfizer COVID-19 Vaccine 01/11/2020 12:32 PM 0.3 mL 11/13/2018 Intramuscular   Manufacturer: [employer], [employer]   Lot: [id number]   NDC: [address]-5

## 2020-01-14 ENCOUNTER — Other Ambulatory Visit: Payer: Self-pay | Admitting: Physician Assistant

## 2020-01-14 DIAGNOSIS — K219 Gastro-esophageal reflux disease without esophagitis: Secondary | ICD-10-CM

## 2020-01-15 ENCOUNTER — Ambulatory Visit: Payer: Self-pay

## 2020-01-16 ENCOUNTER — Other Ambulatory Visit: Payer: Self-pay | Admitting: Neurology

## 2020-01-16 DIAGNOSIS — R413 Other amnesia: Secondary | ICD-10-CM

## 2020-01-16 MED ORDER — DONEPEZIL HCL 10 MG PO TABS: 5.0000 mg | ORAL_TABLET | Freq: Every day | ORAL | 0 refills | Status: DC

## 2020-01-16 NOTE — Progress Notes (Signed)
Resent Donepezil, pharmacy states they did not receive it.

## 2020-01-22 ENCOUNTER — Other Ambulatory Visit: Payer: Self-pay | Admitting: Nurse Practitioner

## 2020-01-22 DIAGNOSIS — R413 Other amnesia: Secondary | ICD-10-CM

## 2020-01-22 MED ORDER — DONEPEZIL HCL 10 MG PO TABS: 5.0000 mg | ORAL_TABLET | Freq: Every day | ORAL | 0 refills | Status: DC

## 2020-01-24 ENCOUNTER — Other Ambulatory Visit: Payer: Self-pay | Admitting: Neurology

## 2020-01-24 MED ORDER — ZOLPIDEM TARTRATE 10 MG PO TABS: 10.0000 mg | ORAL_TABLET | Freq: Every evening | ORAL | 0 refills | Status: DC | PRN

## 2020-01-24 NOTE — Telephone Encounter (Signed)
Last filled 12/04/2019 #30 with 1 refill. Overdue for appt. Pended with note that patient needs appt for refills.

## 2020-01-28 ENCOUNTER — Encounter: Payer: Self-pay | Admitting: Nurse Practitioner

## 2020-01-28 ENCOUNTER — Ambulatory Visit (INDEPENDENT_AMBULATORY_CARE_PROVIDER_SITE_OTHER): Payer: Medicare Other | Admitting: Nurse Practitioner

## 2020-01-28 ENCOUNTER — Other Ambulatory Visit: Payer: Self-pay

## 2020-01-28 VITALS — BP 106/57 | HR 64 | Ht 64.0 in | Wt 122.0 lb

## 2020-01-28 DIAGNOSIS — M5416 Radiculopathy, lumbar region: Secondary | ICD-10-CM

## 2020-01-28 DIAGNOSIS — E782 Mixed hyperlipidemia: Secondary | ICD-10-CM

## 2020-01-28 DIAGNOSIS — F5101 Primary insomnia: Secondary | ICD-10-CM

## 2020-01-28 DIAGNOSIS — R413 Other amnesia: Secondary | ICD-10-CM | POA: Diagnosis not present

## 2020-01-28 DIAGNOSIS — K219 Gastro-esophageal reflux disease without esophagitis: Secondary | ICD-10-CM

## 2020-01-28 DIAGNOSIS — F419 Anxiety disorder, unspecified: Secondary | ICD-10-CM | POA: Diagnosis not present

## 2020-01-28 DIAGNOSIS — E039 Hypothyroidism, unspecified: Secondary | ICD-10-CM

## 2020-01-28 DIAGNOSIS — M810 Age-related osteoporosis without current pathological fracture: Secondary | ICD-10-CM | POA: Diagnosis not present

## 2020-01-28 MED ORDER — LEVOTHYROXINE SODIUM 75 MCG PO TABS: 75.0000 ug | ORAL_TABLET | Freq: Every day | ORAL | 3 refills | Status: DC

## 2020-01-28 MED ORDER — PRAVASTATIN SODIUM 80 MG PO TABS: ORAL_TABLET | ORAL | 3 refills | Status: DC

## 2020-01-28 MED ORDER — VITAMIN D (ERGOCALCIFEROL) 1.25 MG (50000 UNIT) PO CAPS: ORAL_CAPSULE | ORAL | 1 refills | Status: DC

## 2020-01-28 MED ORDER — GABAPENTIN 600 MG PO TABS: ORAL_TABLET | ORAL | 1 refills | Status: DC

## 2020-01-28 MED ORDER — HYDROXYZINE HCL 25 MG PO TABS: 25.0000 mg | ORAL_TABLET | Freq: Every day | ORAL | 1 refills | Status: DC

## 2020-01-28 MED ORDER — DONEPEZIL HCL 10 MG PO TABS: 5.0000 mg | ORAL_TABLET | Freq: Every day | ORAL | 1 refills | Status: DC

## 2020-01-28 MED ORDER — PANTOPRAZOLE SODIUM 40 MG PO TBEC: 40.0000 mg | DELAYED_RELEASE_TABLET | Freq: Every day | ORAL | 3 refills | Status: DC

## 2020-01-28 MED ORDER — ZOLPIDEM TARTRATE 10 MG PO TABS: 10.0000 mg | ORAL_TABLET | Freq: Every evening | ORAL | 1 refills | Status: DC | PRN

## 2020-01-28 MED ORDER — POTASSIUM CHLORIDE ER 10 MEQ PO TBCR: EXTENDED_RELEASE_TABLET | ORAL | 3 refills | Status: DC

## 2020-01-28 NOTE — Progress Notes (Signed)
Established Patient Office Visit  Subjective:  Patient ID: Rachael Douglas, female    DOB: 10-14-1955  Age: 64 y.o. MRN: [id number]  CC:  Chief Complaint  Patient presents with  . Follow-up    HPI Rachael Douglas presents for follow-up for medication management and refills.   She reports she has been doing fairly well. She is having constant low back pain due to degeneration of her spine. She is also experiencing worsening ankle pain due to degeneration. She reports the increased dose of Linzess is helping with her chronic constipation symptoms. She denies any known side effects from the medication.  She reports she is sleeping well with the ambien. She is taking the medication nearly every night. She denies day time sleepiness, hallucinations, or decreased reactivity while taking the medication. She reports the hydroxyzine does help with her periods of anxiety. She does not take this on a daily basis, only as needed, which is about once a week.  She does feel that a benzodiazepine may be more helpful but realizes that this cannot be used in conjunction with pain medication. She feels the neurontin is somewhat helpful with her neuropathic pain. She is still experiencing pain, but feels that she would be in more pain without the medication.  She is unsure if the donepezil is really helping at this time, but would like to continue on the medication to see if this does help after she has been taking it for a while.  She reports that she is out of potassium and has been taking that daily since starting on Lasix.  She is also out of protonix and feels that she needs this to help with her symptoms of GERD. She has been taking her thyroid medication daily and denies any side effects or symptoms of hypo or hyperthyroidism.   Past Medical History:  Diagnosis Date  . Arthritis   . Heart disease   . Hyperlipidemia   . Hypertension   . PUD (peptic ulcer disease)   . Thyroid disease     Past  Surgical History:  Procedure Laterality Date  . AORTIC VALVE REPLACEMENT    . TOTAL ABDOMINAL HYSTERECTOMY      Family History  Problem Relation Age of Onset  . Cancer Other   . Depression Other   . Diabetes Other   . Hypertension Other   . Breast cancer Other     Social History   Socioeconomic History  . Marital status: Married    Spouse name: Not on file  . Number of children: 2  . Years of education: Not on file  . Highest education level: Not on file  Occupational History  . Not on file  Tobacco Use  . Smoking status: Former Smoker    Types: E-cigarettes    Quit date: 05/30/2014    Years since quitting: 5.6  . Smokeless tobacco: Never Used  Substance and Sexual Activity  . Alcohol use: No    Alcohol/week: 0.0 standard drinks  . Drug use: No  . Sexual activity: Not Currently  Other Topics Concern  . Not on file  Social History Narrative  . Not on file   Social Determinants of Health   Financial Resource Strain:   . Difficulty of Paying Living Expenses:   Food Insecurity:   . Worried About Programme researcher, broadcasting/film/video in the Last Year:   . Barista in the Last Year:   Transportation Needs:   . Freight forwarder (Medical):   [name]  Lack of Transportation (Non-Medical):   Physical Activity:   . Days of Exercise per Week:   . Minutes of Exercise per Session:   Stress:   . Feeling of Stress :   Social Connections:   . Frequency of Communication with Friends and Family:   . Frequency of Social Gatherings with Friends and Family:   . Attends Religious Services:   . Active Member of Clubs or Organizations:   . Attends Banker Meetings:   [name] Marital Status:   Intimate Partner Violence:   . Fear of Current or Ex-Partner:   . Emotionally Abused:   [name] Physically Abused:   . Sexually Abused:     Outpatient Medications Prior to Visit  Medication Sig Dispense Refill  . albuterol (PROAIR HFA) 108 (90 BASE) MCG/ACT inhaler INHALE 2 PUFFS EVERY 6 HOURS  AS NEEDED    . aspirin EC 81 MG tablet Take 1 tablet (81 mg total) by mouth daily.    . clobetasol cream (TEMOVATE) 0.05 % Apply 1 application topically 2 (two) times daily. 15 g 0  . cyclobenzaprine (FLEXERIL) 10 MG tablet TAKE 1 TABLET(10 MG) BY MOUTH THREE TIMES DAILY 90 tablet 2  . diclofenac sodium (VOLTAREN) 1 % GEL Apply 4 g topically 4 (four) times daily. To affected joint. 100 g 1  . donepezil (ARICEPT) 10 MG tablet Take 0.5 tablets (5 mg total) by mouth at bedtime. Appointment needed for evaluation for further refills. 45 tablet 0  . DULoxetine (CYMBALTA) 60 MG capsule TAKE 2 CAPSULES(120 MG) BY MOUTH DAILY 60 capsule 5  . furosemide (LASIX) 40 MG tablet Take 1 tablet (40 mg total) by mouth daily as needed. For swelling/ needs appt 90 tablet 0  . gabapentin (NEURONTIN) 600 MG tablet TAKE 2 TABLETS(1200 MG) BY MOUTH THREE TIMES DAILY needs appt 540 tablet 0  . hydrOXYzine (ATARAX/VISTARIL) 10 MG tablet Take 1 tablet (10 mg total) by mouth 3 (three) times daily as needed. (Patient taking differently: Take 25 mg by mouth 3 (three) times daily as needed. ) 90 tablet 1  . hydrOXYzine (ATARAX/VISTARIL) 25 MG tablet Take 25 mg by mouth at bedtime.    [name] levothyroxine (SYNTHROID) 75 MCG tablet Take 1 tablet (75 mcg total) by mouth daily before breakfast. 90 tablet 3  . linaclotide (LINZESS) 290 MCG CAPS capsule Take 1 capsule (290 mcg total) by mouth daily. 90 capsule 1  . metoprolol tartrate (LOPRESSOR) 25 MG tablet Take 1 tablet (25 mg total) by mouth 2 (two) times daily. 180 tablet 1  . mometasone-formoterol (DULERA) 200-5 MCG/ACT AERO Inhale 1 puff into the lungs daily. 3 Inhaler 3  . nitrofurantoin, macrocrystal-monohydrate, (MACROBID) 100 MG capsule Take 1 capsule (100 mg total) by mouth 2 (two) times daily. 10 capsule 0  . Omega 3 1200 MG CAPS Take by mouth.    . ondansetron (ZOFRAN ODT) 4 MG disintegrating tablet Take 1 tablet (4 mg total) by mouth every 8 (eight) hours as needed for nausea  or vomiting. 12 tablet 1  . oxyCODONE-acetaminophen (PERCOCET) 10-325 MG tablet Take 1 tablet by mouth every 6 (six) hours as needed.    . pantoprazole (PROTONIX) 40 MG tablet Take 1 tablet (40 mg total) by mouth daily. Needs appt 90 tablet 0  . potassium chloride (KLOR-CON) 10 MEQ tablet TAKE 1 TABLET(10 MEQ) BY MOUTH DAILY 90 tablet 1  . pravastatin (PRAVACHOL) 80 MG tablet TAKE 1 TABLET(80 MG) BY MOUTH DAILY 90 tablet 3  . Triamcinolone Acetonide (TRIAMCINOLONE  0.1 % CREAM : EUCERIN) CREA Apply 1 application topically 2 (two) times daily as needed. 1 each 3  . triamcinolone cream (KENALOG) 0.1 % APPLY EXTERNALLY TO THE AFFECTED AREA TWICE DAILY. AVOID FACE 80 g 1  . Vitamin D, Ergocalciferol, (DRISDOL) 1.25 MG (50000 UT) CAPS capsule TAKE 1 CAPSULE BY MOUTH EVERY 7 DAYS 12 capsule 0  . zolpidem (AMBIEN) 10 MG tablet Take 1 tablet (10 mg total) by mouth at bedtime as needed for sleep. NEEDS APPT FOR REFILLS 30 tablet 0   No facility-administered medications prior to visit.    Allergies  Allergen Reactions  . Penicillins Swelling  . Ranitidine Other (See Comments)    Facial swelling  . Alendronate Other (See Comments)    unknown  . Bisphosphonates     Nausea/stomach pains.     ROS Review of Systems  Constitutional: Negative for activity change, appetite change, chills, fatigue, fever and unexpected weight change.  Respiratory: Negative for chest tightness and shortness of breath.   Cardiovascular: Negative for chest pain, palpitations and leg swelling.  Gastrointestinal: Negative for abdominal distention, constipation, diarrhea, nausea and vomiting.  Endocrine: Negative for cold intolerance and heat intolerance.  Musculoskeletal: Positive for back pain and myalgias.  Skin: Negative for color change and pallor.  Neurological: Negative for dizziness, syncope, weakness, light-headedness and headaches.  Psychiatric/Behavioral: Negative for agitation, behavioral problems, confusion,  decreased concentration and sleep disturbance. The patient is not nervous/anxious.       Objective:    Physical Exam  BP (!) 106/57   Pulse 64   Ht 5\' 4"  (1.626 m)   Wt 122 lb (55.3 kg)   SpO2 97%   BMI 20.94 kg/m  Wt Readings from Last 3 Encounters:  01/28/20 122 lb (55.3 kg)  12/18/19 125 lb (56.7 kg)  12/16/19 127 lb 1.9 oz (57.7 kg)     Health Maintenance Due  Topic Date Due  . PNA vac Low Risk Adult (2 of 2 - PPSV23) 12/07/2019    There are no preventive care reminders to display for this patient.  Lab Results  Component Value Date   TSH 0.75 02/21/2019   Lab Results  Component Value Date   WBC 6.9 07/24/2019   HGB 13.8 07/24/2019   HCT 42.4 07/24/2019   MCV 84.1 07/24/2019   PLT 299 07/24/2019   Lab Results  Component Value Date   NA 141 02/21/2019   K 4.2 02/21/2019   CO2 33 (H) 02/21/2019   GLUCOSE 90 02/21/2019   BUN 12 02/21/2019   CREATININE 0.91 02/21/2019   BILITOT 0.5 02/21/2019   ALKPHOS 61 12/07/2016   AST 13 02/21/2019   ALT 7 02/21/2019   PROT 6.9 02/21/2019   ALBUMIN 4.1 12/07/2016   CALCIUM 9.2 02/21/2019   Lab Results  Component Value Date   CHOL 257 (H) 02/21/2019   Lab Results  Component Value Date   HDL 59 02/21/2019   Lab Results  Component Value Date   LDLCALC 174 (H) 02/21/2019   Lab Results  Component Value Date   TRIG 114 02/21/2019   Lab Results  Component Value Date   CHOLHDL 4.4 02/21/2019   No results found for: HGBA1C    Assessment & Plan:   1. Age-related osteoporosis without current pathological fracture - Vitamin D, Ergocalciferol, (DRISDOL) 1.25 MG (50000 UNIT) CAPS capsule; TAKE 1 CAPSULE BY MOUTH EVERY 7 DAYS  Dispense: 12 capsule; Refill: 1 2. Memory changes - donepezil (ARICEPT) 10 MG tablet; Take 0.5  tablets (5 mg total) by mouth at bedtime.  Dispense: 45 tablet; Refill: 1 3. Primary insomnia  zolpidem (AMBIEN) 10 MG tablet; Take 1 tablet (10 mg total) by mouth at bedtime as needed for  sleep.  Dispense: 90 tablet; Refill: 1 4. Anxiety - hydrOXYzine (ATARAX/VISTARIL) 25 MG tablet; Take 1 tablet (25 mg total) by mouth at bedtime.  Dispense: 90 tablet; Refill: 1 5. Lumbar radiculopathy - gabapentin (NEURONTIN) 600 MG tablet; TAKE 2 TABLETS(1200 MG) BY MOUTH THREE TIMES DAILY  Dispense: 540 tablet; Refill: 1 6. Acquired hypothyroidism  levothyroxine (SYNTHROID) 75 MCG tablet; Take 1 tablet (75 mcg total) by mouth daily before breakfast.  Dispense: 90 tablet; Refill: 3 7. Mixed hyperlipidemia  pravastatin (PRAVACHOL) 80 MG tablet; TAKE 1 TABLET(80 MG) BY MOUTH DAILY  Dispense: 90 tablet; Refill: 3 8. Gastroesophageal reflux disease - pantoprazole (PROTONIX) 40 MG tablet; Take 1 tablet (40 mg total) by mouth daily.  Dispense: 90 tablet; Refill: 3  Refills provided on all medications today.  No changes to medication doses were made.  And to follow-up in approximately 6 months with PCP.  She will need lab work at that time including TSH, CBC, CMP, and lipid panel. Patient instructed to return to office if needed prior to next appointment time.  Orma Render, NP

## 2020-02-10 ENCOUNTER — Other Ambulatory Visit: Payer: Self-pay

## 2020-02-10 DIAGNOSIS — F331 Major depressive disorder, recurrent, moderate: Secondary | ICD-10-CM

## 2020-02-10 MED ORDER — DULOXETINE HCL 60 MG PO CPEP: 120.0000 mg | ORAL_CAPSULE | Freq: Every day | ORAL | 4 refills | Status: DC

## 2020-02-12 ENCOUNTER — Ambulatory Visit: Payer: Medicare Other | Admitting: Physician Assistant

## 2020-02-18 ENCOUNTER — Telehealth: Payer: Self-pay | Admitting: Physician Assistant

## 2020-02-18 NOTE — Telephone Encounter (Signed)
Received fax for PA on compound medication Triam / Eucerin sent through cover my meds waiting on determination. - CF

## 2020-02-19 NOTE — Telephone Encounter (Signed)
Received fax from [employer] and they denied coverage on Triam/Eucerin due to its not being used for a FDA approved for Medicare approved treatments of the medical condition placing in providers box for review. - CF

## 2020-02-25 ENCOUNTER — Encounter: Payer: Self-pay | Admitting: Nurse Practitioner

## 2020-02-25 ENCOUNTER — Telehealth (INDEPENDENT_AMBULATORY_CARE_PROVIDER_SITE_OTHER): Payer: Medicare Other | Admitting: Nurse Practitioner

## 2020-02-25 VITALS — BP 111/70 | HR 62 | Temp 97.6°F

## 2020-02-25 DIAGNOSIS — J0100 Acute maxillary sinusitis, unspecified: Secondary | ICD-10-CM

## 2020-02-25 MED ORDER — DOXYCYCLINE HYCLATE 100 MG PO TABS: 100.0000 mg | ORAL_TABLET | Freq: Two times a day (BID) | ORAL | 0 refills | Status: DC

## 2020-02-25 NOTE — Patient Instructions (Addendum)
The following information is provided as a Counsellor for ADULT patients only and does NOT take into account PREGNANCY, ALLERGIES, LIVER CONDITIONS, KIDNEY CONDITIONS, GASTROINTESTINAL CONDITIONS, OR PRESCRIPTION MEDICATION INTERACTIONS. Please be sure to ask your provider if the following are safe to take with your specific medical history, conditions, or current medication regimen if you are unsure.   Adult Basic Symptom Management for Sinusitis  Congestion: Guaifenesin (Mucinex)- follow directions on packaging with a maximum dose of 2400mg  in a 24 hour period.  Pain/Fever: Ibuprofen 200mg  - 400mg  every 4-6 hours as needed (MAX 1200mg  in a 24 hour period) Pain/Fever: Tylenol 500mg  -1000mg  every 6-8 hours as needed (MAX 3000mg  in a 24 hour period)  Cough: Dextromethorphan (Delsym)- follow directions on packing with a maximum dose of 120mg  in a 24 hour period.  Nasal Stuffiness: Saline nasal spray and/or Nettie Pot with sterile saline solution  Runny Nose: Fluticasone nasal spray (Flonase) OR Mometasone nasal spray (Nasonex) OR Triamcinolone Acetonide nasal spray (Nasacort)- follow directions on the packaging  Pain/Pressure: Warm washcloth to the face  Sore Throat: Warm salt water gargles  If you have allergies, you may also consider taking an oral antihistamine (like Zyrtec or Claritin) as these may also help with your symptoms.  **Many medications will have more than one ingredient, be sure you are reading the packaging carefully and not taking more than one dose of the same kind of medication at the same time or too close together. It is OK to use formulas that have all of the ingredients you want, but do not take them in a combined medication and as separate dose too close together. If you have any questions, the pharmacist will be happy to help you decide what is safe.    Sinusitis, Adult Sinusitis is soreness and swelling (inflammation) of your sinuses. Sinuses are hollow  spaces in the bones around your face. They are located:  Around your eyes.  In the middle of your forehead.  Behind your nose.  In your cheekbones. Your sinuses and nasal passages are lined with a fluid called mucus. Mucus drains out of your sinuses. Swelling can trap mucus in your sinuses. This lets germs (bacteria, virus, or fungus) grow, which leads to infection. Most of the time, this condition is caused by a virus. What are the causes? This condition is caused by:  Allergies.  Asthma.  Germs.  Things that block your nose or sinuses.  Growths in the nose (nasal polyps).  Chemicals or irritants in the air.  Fungus (rare). What increases the risk? You are more likely to develop this condition if:  You have a weak body defense system (immune system).  You do a lot of swimming or diving.  You use nasal sprays too much.  You smoke. What are the signs or symptoms? The main symptoms of this condition are pain and a feeling of pressure around the sinuses. Other symptoms include:  Stuffy nose (congestion).  Runny nose (drainage).  Swelling and warmth in the sinuses.  Headache.  Toothache.  A cough that may get worse at night.  Mucus that collects in the throat or the back of the nose (postnasal drip).  Being unable to smell and taste.  Being very tired (fatigue).  A fever.  Sore throat.  Bad breath. How is this diagnosed? This condition is diagnosed based on:  Your symptoms.  Your medical history.  A physical exam.  Tests to find out if your condition is short-term (acute) or long-term (chronic).  Your doctor may: ? Check your nose for growths (polyps). ? Check your sinuses using a tool that has a light (endoscope). ? Check for allergies or germs. ? Do imaging tests, such as an MRI or CT scan. How is this treated? Treatment for this condition depends on the cause and whether it is short-term or long-term.  If caused by a virus, your symptoms  should go away on their own within 10 days. You may be given medicines to relieve symptoms. They include: ? Medicines that shrink swollen tissue in the nose. ? Medicines that treat allergies (antihistamines). ? A spray that treats swelling of the nostrils. ? Rinses that help get rid of thick mucus in your nose (nasal saline washes).  If caused by bacteria, your doctor may wait to see if you will get better without treatment. You may be given antibiotic medicine if you have: ? A very bad infection. ? A weak body defense system.  If caused by growths in the nose, you may need to have surgery. Follow these instructions at home: Medicines  Take, use, or apply over-the-counter and prescription medicines only as told by your doctor. These may include nasal sprays.  If you were prescribed an antibiotic medicine, take it as told by your doctor. Do not stop taking the antibiotic even if you start to feel better. Hydrate and humidify   Drink enough water to keep your pee (urine) pale yellow.  Use a cool mist humidifier to keep the humidity level in your home above 50%.  Breathe in steam for 10-15 minutes, 3-4 times a day, or as told by your doctor. You can do this in the bathroom while a hot shower is running.  Try not to spend time in cool or dry air. Rest  Rest as much as you can.  Sleep with your head raised (elevated).  Make sure you get enough sleep each night. General instructions   Put a warm, moist washcloth on your face 3-4 times a day, or as often as told by your doctor. This will help with discomfort.  Wash your hands often with soap and water. If there is no soap and water, use hand sanitizer.  Do not smoke. Avoid being around people who are smoking (secondhand smoke).  Keep all follow-up visits as told by your doctor. This is important. Contact a doctor if:  You have a fever.  Your symptoms get worse.  Your symptoms do not get better within 10 days. Get help  right away if:  You have a very bad headache.  You cannot stop throwing up (vomiting).  You have very bad pain or swelling around your face or eyes.  You have trouble seeing.  You feel confused.  Your neck is stiff.  You have trouble breathing. Summary  Sinusitis is swelling of your sinuses. Sinuses are hollow spaces in the bones around your face.  This condition is caused by tissues in your nose that become inflamed or swollen. This traps germs. These can lead to infection.  If you were prescribed an antibiotic medicine, take it as told by your doctor. Do not stop taking it even if you start to feel better.  Keep all follow-up visits as told by your doctor. This is important. This information is not intended to replace advice given to you by your health care provider. Make sure you discuss any questions you have with your health care provider. Document Revised: 02/05/2018 Document Reviewed: 02/05/2018 Elsevier Patient Education  Register.

## 2020-02-25 NOTE — Progress Notes (Signed)
Virtual Visit via MyChart Note  I connected with  Rachael Douglas on 02/25/20 at  8:50 AM EDT by the video enabled telemedicine application, MyChart, and verified that I am speaking with the correct person using two identifiers.   I introduced myself as a Publishing rights manager with the practice. We discussed the limitations of evaluation and management by telemedicine and the availability of in person appointments. The patient expressed understanding and agreed to proceed.  The patient is: at home I am: in the office  Subjective:    CC: Sinus pain and pressure  HPI: Rachael Douglas is a 64 y.o. y/o female presenting via MyChart today for sinus pain and pressure in her eyes and cheeks, headache, fatigue, rhinorrhea, and congestion that started over a week ago. She thought her symptoms were due to allergies and attempted conservative treatment at home with allergy medication and excedrine migraine. Her symptoms did improve a little initially, but have since gotten worse.   She denies fever, chills, shortness of breath. She endorses sinus pain and pressure, ear pain and pressure with popping in her left ear, mild cough, headache, rhinorrhea, sinus congestion, and fatigue.     Past medical history, Surgical history, Family history not pertinant except as noted below, Social history, Allergies, and medications have been entered into the medical record, reviewed, and corrections made.   Review of Systems:  See HPI  Objective:    General: Speaking clearly in complete sentences without any shortness of breath.  Alert and oriented x3.  Normal judgment. No apparent acute distress.   Impression and Recommendations:   1. Acute non-recurrent maxillary sinusitis Symptoms and presentation consistent with acute maxillary sinusitis. Given the length of time symptoms have been present, we will plan to start antibiotics today. She does have a penicillin allergy.  PLAN: -Doxycycline 100mg  twice a day for  7 days. -Continue conservative management with Tylenol, Ibuprofen, or Excedrine as directed on the package.  -You may also try Mucinex to help with congestion. -Warm compresses to the face can be helpful for pressure  -If symptoms worsen or fail to improve, please notify the office for further evaluation  - doxycycline (VIBRA-TABS) 100 MG tablet; Take 1 tablet (100 mg total) by mouth 2 (two) times daily.  Dispense: 14 tablet; Refill: 0    I discussed the assessment and treatment plan with the patient. The patient was provided an opportunity to ask questions and all were answered. The patient agreed with the plan and demonstrated an understanding of the instructions.   The patient was advised to call back or seek an in-person evaluation if the symptoms worsen or if the condition fails to improve as anticipated.  I provided 10 minutes of non-face-to-face interaction with this MYCHART visit.   , NP

## 2020-02-26 ENCOUNTER — Ambulatory Visit (INDEPENDENT_AMBULATORY_CARE_PROVIDER_SITE_OTHER): Payer: Medicare Other | Admitting: Family Medicine

## 2020-02-26 ENCOUNTER — Other Ambulatory Visit: Payer: Self-pay | Admitting: Physician Assistant

## 2020-02-26 ENCOUNTER — Encounter: Payer: Self-pay | Admitting: Family Medicine

## 2020-02-26 ENCOUNTER — Other Ambulatory Visit: Payer: Self-pay

## 2020-02-26 DIAGNOSIS — R519 Headache, unspecified: Secondary | ICD-10-CM | POA: Diagnosis not present

## 2020-02-26 MED ORDER — PROMETHAZINE HCL 25 MG/ML IJ SOLN
25.0000 mg | Freq: Once | INTRAMUSCULAR | Status: AC
  Administered 2020-02-26: 25 mg via INTRAMUSCULAR

## 2020-02-26 MED ORDER — KETOROLAC TROMETHAMINE 60 MG/2ML IM SOLN
60.0000 mg | Freq: Once | INTRAMUSCULAR | Status: AC
  Administered 2020-02-26: 60 mg via INTRAMUSCULAR

## 2020-02-26 MED ORDER — DEXAMETHASONE SODIUM PHOSPHATE 4 MG/ML IJ SOLN
4.0000 mg | Freq: Once | INTRAMUSCULAR | Status: AC
  Administered 2020-02-26: 4 mg via INTRAMUSCULAR

## 2020-02-26 NOTE — Patient Instructions (Signed)

## 2020-02-26 NOTE — Assessment & Plan Note (Addendum)
Neuro exam in is non-focal. No red flags in history.  BP elevated today recheck manually and improved.   Analgesic rebound possibly contributing as she is taking oxycodone daily.  Given injections of toradol, promethazine and decadron today.   Reviewed symptoms of stroke and if she develops any of these she should contact 911.   Discussed if developing new or worsening symptoms she should let us know

## 2020-02-26 NOTE — Progress Notes (Signed)
Rachael Douglas - 64 y.o. female MRN [id number]  Date of birth: 1956-09-08  Subjective No chief complaint on file.   HPI  Allergies  Allergen Reactions  . Penicillins Swelling  . Ranitidine Other (See Comments)    Facial swelling  . Alendronate Other (See Comments)    unknown  . Bisphosphonates     Nausea/stomach pains.     Past Medical History:  Diagnosis Date  . Arthritis   . Heart disease   . Hyperlipidemia   . Hypertension   . PUD (peptic ulcer disease)   . Thyroid disease     Past Surgical History:  Procedure Laterality Date  . AORTIC VALVE REPLACEMENT    . TOTAL ABDOMINAL HYSTERECTOMY      Social History   Socioeconomic History  . Marital status: Married    Spouse name: Not on file  . Number of children: 2  . Years of education: Not on file  . Highest education level: Not on file  Occupational History  . Not on file  Tobacco Use  . Smoking status: Former Smoker    Types: E-cigarettes    Quit date: 05/30/2014    Years since quitting: 5.7  . Smokeless tobacco: Never Used  Substance and Sexual Activity  . Alcohol use: No    Alcohol/week: 0.0 standard drinks  . Drug use: No  . Sexual activity: Not Currently  Other Topics Concern  . Not on file  Social History Narrative  . Not on file   Social Determinants of Health   Financial Resource Strain:   . Difficulty of Paying Living Expenses:   Food Insecurity:   . Worried About Programme researcher, broadcasting/film/video in the Last Year:   . Barista in the Last Year:   Transportation Needs:   . Freight forwarder (Medical):   [name] Lack of Transportation (Non-Medical):   Physical Activity:   . Days of Exercise per Week:   . Minutes of Exercise per Session:   Stress:   . Feeling of Stress :   Social Connections:   . Frequency of Communication with Friends and Family:   . Frequency of Social Gatherings with Friends and Family:   . Attends Religious Services:   . Active Member of Clubs or Organizations:   .  Attends Banker Meetings:   [name] Marital Status:     Family History  Problem Relation Age of Onset  . Cancer Other   . Depression Other   . Diabetes Other   . Hypertension Other   . Breast cancer Other     Health Maintenance  Topic Date Due  . PNA vac Low Risk Adult (2 of 2 - PPSV23) 03/10/2020 (Originally 12/07/2019)  . HIV Screening  12/08/2026 (Originally [birth year]/1969)  . INFLUENZA VACCINE  04/19/2020  . MAMMOGRAM  06/06/2020  . TETANUS/TDAP  01/31/2022  . COLONOSCOPY  09/05/2026  . DEXA SCAN  Completed  . COVID-19 Vaccine  Completed  . Hepatitis C Screening  Completed     ----------------------------------------------------------------------------------------------------------------------------------------------------------------------------------------------------------------- Physical Exam BP 136/72   Pulse 70   Ht 5' 4.17" (1.63 m)   Wt 121 lb 14.4 oz (55.3 kg)   SpO2 100%   BMI 20.81 kg/m   Physical Exam Constitutional:      Appearance: Normal appearance.  HENT:     Head: Normocephalic and atraumatic.  Eyes:     General: No scleral icterus. Cardiovascular:     Rate and Rhythm: Normal rate and regular rhythm.  Pulmonary:     Effort: Pulmonary effort is normal.     Breath sounds: Normal breath sounds.  Skin:    General: Skin is warm and dry.  Neurological:     General: No focal deficit present.     Mental Status: She is alert and oriented to person, place, and time.     Cranial Nerves: No cranial nerve deficit.     Sensory: No sensory deficit.     Motor: No weakness.     Coordination: Coordination normal (Normal finger to nose. ).     Gait: Gait normal.  Psychiatric:        Mood and Affect: Mood normal.        Behavior: Behavior normal.      ------------------------------------------------------------------------------------------------------------------------------------------------------------------------------------------------------------------- Assessment and Plan  Headache Neuro exam in is non-focal. No red flags in history.  BP elevated today recheck manually and improved.   Analgesic rebound possibly contributing as she is taking oxycodone daily.  Given injections of toradol, promethazine and decadron today.   Reviewed symptoms of stroke and if she develops any of these she should contact 911.   Discussed if developing new or worsening symptoms she should let us know    No orders of the defined types were placed in this encounter.   No follow-ups on file.    This visit occurred during the SARS-CoV-2 public health emergency.  Safety protocols were in place, including screening questions prior to the visit, additional usage of staff PPE, and extensive cleaning of exam room while observing appropriate contact time as indicated for disinfecting solutions.

## 2020-02-26 NOTE — Addendum Note (Signed)
Addended by: Ardyth Man on: 02/26/2020 04:42 PM   Modules accepted: Orders

## 2020-02-27 NOTE — Telephone Encounter (Signed)
LMOM letting patient know per Lesly Rubenstein she can use Eucerin and Triamcinolone cream separated. If she has any questions she was instructed to call back.

## 2020-03-02 ENCOUNTER — Other Ambulatory Visit: Payer: Self-pay

## 2020-03-02 MED ORDER — ONDANSETRON 4 MG PO TBDP: 4.0000 mg | ORAL_TABLET | Freq: Three times a day (TID) | ORAL | 1 refills | Status: DC | PRN

## 2020-03-14 ENCOUNTER — Other Ambulatory Visit: Payer: Self-pay | Admitting: Physician Assistant

## 2020-03-19 DIAGNOSIS — H353130 Nonexudative age-related macular degeneration, bilateral, stage unspecified: Secondary | ICD-10-CM | POA: Diagnosis not present

## 2020-04-06 DIAGNOSIS — M47816 Spondylosis without myelopathy or radiculopathy, lumbar region: Secondary | ICD-10-CM | POA: Diagnosis not present

## 2020-04-06 DIAGNOSIS — M5032 Other cervical disc degeneration, mid-cervical region, unspecified level: Secondary | ICD-10-CM | POA: Diagnosis not present

## 2020-04-06 DIAGNOSIS — Z79891 Long term (current) use of opiate analgesic: Secondary | ICD-10-CM | POA: Diagnosis not present

## 2020-04-06 DIAGNOSIS — M461 Sacroiliitis, not elsewhere classified: Secondary | ICD-10-CM | POA: Diagnosis not present

## 2020-04-06 DIAGNOSIS — M5136 Other intervertebral disc degeneration, lumbar region: Secondary | ICD-10-CM | POA: Diagnosis not present

## 2020-04-13 ENCOUNTER — Other Ambulatory Visit: Payer: Self-pay | Admitting: Neurology

## 2020-04-13 ENCOUNTER — Other Ambulatory Visit: Payer: Self-pay | Admitting: Physician Assistant

## 2020-04-13 MED ORDER — FUROSEMIDE 40 MG PO TABS: 40.0000 mg | ORAL_TABLET | Freq: Every day | ORAL | 0 refills | Status: DC | PRN

## 2020-04-22 ENCOUNTER — Other Ambulatory Visit: Payer: Self-pay | Admitting: Nurse Practitioner

## 2020-04-22 DIAGNOSIS — L309 Dermatitis, unspecified: Secondary | ICD-10-CM

## 2020-04-28 ENCOUNTER — Telehealth: Payer: Self-pay

## 2020-04-28 DIAGNOSIS — R413 Other amnesia: Secondary | ICD-10-CM

## 2020-04-28 NOTE — Telephone Encounter (Signed)
Rachael Douglas called and states she is still having trouble remembering things. She wanted to know if Lesly Rubenstein would increase the Aricept. I advised that Lesly Rubenstein will be out of the office until next week. She is ok to wait for her return.

## 2020-05-04 MED ORDER — DONEPEZIL HCL 10 MG PO TABS: 10.0000 mg | ORAL_TABLET | Freq: Every day | ORAL | 1 refills | Status: DC

## 2020-05-04 NOTE — Telephone Encounter (Signed)
Patient advised and new prescription has been sent.

## 2020-05-04 NOTE — Telephone Encounter (Signed)
Ok to increase to 1 tablet at bedtime. Please send new rx to reflect dose change.

## 2020-06-08 DIAGNOSIS — M47816 Spondylosis without myelopathy or radiculopathy, lumbar region: Secondary | ICD-10-CM | POA: Diagnosis not present

## 2020-06-08 DIAGNOSIS — M461 Sacroiliitis, not elsewhere classified: Secondary | ICD-10-CM | POA: Diagnosis not present

## 2020-06-08 DIAGNOSIS — Z79891 Long term (current) use of opiate analgesic: Secondary | ICD-10-CM | POA: Diagnosis not present

## 2020-06-08 DIAGNOSIS — M5136 Other intervertebral disc degeneration, lumbar region: Secondary | ICD-10-CM | POA: Diagnosis not present

## 2020-06-08 DIAGNOSIS — M5032 Other cervical disc degeneration, mid-cervical region, unspecified level: Secondary | ICD-10-CM | POA: Diagnosis not present

## 2020-06-09 ENCOUNTER — Other Ambulatory Visit: Payer: Self-pay

## 2020-06-09 DIAGNOSIS — F331 Major depressive disorder, recurrent, moderate: Secondary | ICD-10-CM

## 2020-06-09 MED ORDER — DULOXETINE HCL 60 MG PO CPEP: 120.0000 mg | ORAL_CAPSULE | Freq: Every day | ORAL | 2 refills | Status: DC

## 2020-06-09 MED ORDER — FUROSEMIDE 40 MG PO TABS: 40.0000 mg | ORAL_TABLET | Freq: Every day | ORAL | 0 refills | Status: DC | PRN

## 2020-06-10 ENCOUNTER — Other Ambulatory Visit: Payer: Self-pay | Admitting: Physician Assistant

## 2020-06-20 ENCOUNTER — Other Ambulatory Visit: Payer: Self-pay | Admitting: Nurse Practitioner

## 2020-06-20 DIAGNOSIS — F419 Anxiety disorder, unspecified: Secondary | ICD-10-CM

## 2020-07-08 ENCOUNTER — Other Ambulatory Visit: Payer: Self-pay | Admitting: Physician Assistant

## 2020-07-08 ENCOUNTER — Other Ambulatory Visit: Payer: Self-pay | Admitting: Nurse Practitioner

## 2020-07-08 DIAGNOSIS — M5416 Radiculopathy, lumbar region: Secondary | ICD-10-CM

## 2020-07-16 ENCOUNTER — Other Ambulatory Visit: Payer: Self-pay | Admitting: Physician Assistant

## 2020-07-16 DIAGNOSIS — F5101 Primary insomnia: Secondary | ICD-10-CM

## 2020-07-17 ENCOUNTER — Other Ambulatory Visit: Payer: Self-pay

## 2020-07-17 DIAGNOSIS — F5101 Primary insomnia: Secondary | ICD-10-CM

## 2020-07-17 MED ORDER — ZOLPIDEM TARTRATE 10 MG PO TABS: 10.0000 mg | ORAL_TABLET | Freq: Every evening | ORAL | 1 refills | Status: DC | PRN

## 2020-07-17 NOTE — Telephone Encounter (Signed)
Ambien refill request.

## 2020-07-20 ENCOUNTER — Other Ambulatory Visit: Payer: Self-pay | Admitting: Physician Assistant

## 2020-07-27 ENCOUNTER — Other Ambulatory Visit: Payer: Self-pay | Admitting: Nurse Practitioner

## 2020-07-27 DIAGNOSIS — M810 Age-related osteoporosis without current pathological fracture: Secondary | ICD-10-CM

## 2020-08-05 DIAGNOSIS — M5136 Other intervertebral disc degeneration, lumbar region: Secondary | ICD-10-CM | POA: Diagnosis not present

## 2020-08-05 DIAGNOSIS — M461 Sacroiliitis, not elsewhere classified: Secondary | ICD-10-CM | POA: Diagnosis not present

## 2020-08-05 DIAGNOSIS — Z79891 Long term (current) use of opiate analgesic: Secondary | ICD-10-CM | POA: Diagnosis not present

## 2020-08-05 DIAGNOSIS — M5032 Other cervical disc degeneration, mid-cervical region, unspecified level: Secondary | ICD-10-CM | POA: Diagnosis not present

## 2020-08-05 DIAGNOSIS — M47816 Spondylosis without myelopathy or radiculopathy, lumbar region: Secondary | ICD-10-CM | POA: Diagnosis not present

## 2020-08-07 ENCOUNTER — Other Ambulatory Visit: Payer: Self-pay | Admitting: Physician Assistant

## 2020-08-10 ENCOUNTER — Telehealth (INDEPENDENT_AMBULATORY_CARE_PROVIDER_SITE_OTHER): Payer: Medicare Other | Admitting: Physician Assistant

## 2020-08-10 ENCOUNTER — Encounter: Payer: Self-pay | Admitting: Physician Assistant

## 2020-08-10 VITALS — BP 127/75 | HR 66 | Temp 97.5°F | Ht 64.0 in | Wt 121.0 lb

## 2020-08-10 DIAGNOSIS — J0140 Acute pansinusitis, unspecified: Secondary | ICD-10-CM | POA: Diagnosis not present

## 2020-08-10 MED ORDER — DOXYCYCLINE HYCLATE 100 MG PO TABS: 100.0000 mg | ORAL_TABLET | Freq: Two times a day (BID) | ORAL | 0 refills | Status: DC

## 2020-08-10 MED ORDER — FLUTICASONE PROPIONATE 50 MCG/ACT NA SUSP: 2.0000 | Freq: Every day | NASAL | 2 refills | Status: DC

## 2020-08-10 NOTE — Progress Notes (Signed)
Patient ID: Rachael Douglas, female   DOB: [birth year], 64 y.o.   MRN: [id number] .Marland KitchenVirtual Visit via Video Note  I connected with Rachael Douglas on 08/10/20 at  2:40 PM EST by a telephone enabled telemedicine application and verified that I am speaking with the correct person using two identifiers.  Location: Patient: home Provider: clinic   I discussed the limitations of evaluation and management by telemedicine and the availability of in person appointments. The patient expressed understanding and agreed to proceed.  History of Present Illness: Patient is a 64 year old female who calls into the clinic with upper respiratory symptoms for the last week.  She is having lots of sinus pressure, sinus drainage, cough, congestion, headache.  She denies any sick contacts.  She is Covid vaccinated with 2 shots.  She denies any loss of smell or taste, wheezing, shortness of breath, diarrhea, fever, nausea, vomiting.  She has been using over-the-counter Benadryl and cold preparations.  They help somewhat.  She feels like she is getting worse.  .. Active Ambulatory Problems    Diagnosis Date Noted  . Hypothyroidism 05/16/2014  . Anxiety disorder 05/16/2014  . Aortic heart valve narrowing 04/12/2013  . Arthropathia 07/10/2008  . Leg pain 12/04/2012  . DDD (degenerative disc disease), lumbar 05/16/2014  . Anxiety and depression 05/16/2014  . Gastric catarrh 08/30/2013  . Acid reflux 05/16/2014  . HLD (hyperlipidemia) 05/16/2014  . Arteriosclerosis of coronary artery 08/30/2013  . OP (osteoporosis) 05/16/2014  . Major depressive disorder, recurrent episode (HCC) 05/16/2014  . COPD, moderate (HCC) 09/25/2014  . Stricture and stenosis of esophagus 10/23/2014  . Aortic valve replaced 10/23/2014  . Constipation 12/17/2014  . Complication of surgery 11/13/2015  . Left foot pain 02/05/2016  . Failure to attend appointment 05/05/2016  . Diarrhea 08/01/2016  . Multiple gastric ulcers 09/05/2016  .  Collagenous colitis 09/09/2016  . Right shoulder pain 11/23/2016  . Left shoulder pain 11/23/2016  . Trochanteric bursitis of both hips 12/07/2016  . Primary insomnia 05/16/2017  . Chronic prescription benzodiazepine use 07/04/2017  . Left carotid bruit 08/15/2017  . Actinic keratoses 12/03/2017  . Pruritus 02/21/2018  . Ankle ligament laxity, right 05/28/2018  . Eczema 05/30/2018  . Palpable mass of lower back 05/31/2018  . Chronic bilateral low back pain without sciatica 05/31/2018  . Easy bruising 09/12/2018  . Family history of Alzheimer's disease 07/29/2019  . Headache 02/26/2020   Resolved Ambulatory Problems    Diagnosis Date Noted  . Rash and nonspecific skin eruption 07/04/2017  . Anxiety 05/31/2018   Past Medical History:  Diagnosis Date  . Arthritis   . Heart disease   . Hyperlipidemia   . Hypertension   . PUD (peptic ulcer disease)   . Thyroid disease        Observations/Objective: No acute distress Normal breathing Normal mood and appearance Able to speak in sentences  .[name] Today's Vitals   08/10/20 1345  BP: 127/75  Pulse: 66  Temp: (!) 97.5 F (36.4 C)  TempSrc: Oral  Weight: 121 lb (54.9 kg)  Height: 5\' 4"  (1.626 m)   Body mass index is 20.77 kg/m.    Assessment and Plan: Marland KitchenViolet was seen today for cough.  Diagnoses and all orders for this visit:  Acute non-recurrent pansinusitis -     doxycycline (VIBRA-TABS) 100 MG tablet; Take 1 tablet (100 mg total) by mouth 2 (two) times daily. -     fluticasone (FLONASE) 50 MCG/ACT nasal spray; Place 2 sprays into  both nostrils daily.    Symptoms have been present for a week.  We will treat for sinusitis.  Likelihood of Covid is low.  She is outside the window if we test her to get monoclonal antibodies.  She does not go anywhere and is already self quarantined at home.  She has no Covid contacts.  Went ahead and treated for sinusitis with doxycycline due to penicillin allergy and Flonase.   Encourage rest and hydration.  If worsening shortness of breath or breathing please go to urgent care or emergency room.  Follow Up Instructions:    I discussed the assessment and treatment plan with the patient. The patient was provided an opportunity to ask questions and all were answered. The patient agreed with the plan and demonstrated an understanding of the instructions.   The patient was advised to call back or seek an in-person evaluation if the symptoms worsen or if the condition fails to improve as anticipated.  I provided 15 minutes of non-face-to-face time during this encounter.   Tandy Gaw, PA-C

## 2020-08-10 NOTE — Progress Notes (Deleted)
Started 1 week ago Sinus pressure Headache  congestion Cough   Has been taking benadryl, tylenol OTC

## 2020-08-19 ENCOUNTER — Other Ambulatory Visit: Payer: Self-pay | Admitting: Physician Assistant

## 2020-09-14 ENCOUNTER — Other Ambulatory Visit: Payer: Self-pay | Admitting: Physician Assistant

## 2020-09-14 ENCOUNTER — Telehealth: Payer: Self-pay | Admitting: Physician Assistant

## 2020-09-14 MED ORDER — NYSTATIN 100000 UNIT/ML MT SUSP: 500000.0000 [IU] | Freq: Four times a day (QID) | OROMUCOSAL | 0 refills | Status: DC

## 2020-09-14 NOTE — Telephone Encounter (Signed)
Patient had thrush a couple of weeks ago and her dentist, Denyse Amass gave her Nystatin oral suspension.  Her dentist is closed until 09/22/20.  Patient wants to know if you can send a prescription to the pharmacy for the nystatin.  Please Advise!!

## 2020-09-21 ENCOUNTER — Other Ambulatory Visit: Payer: Self-pay | Admitting: *Deleted

## 2020-09-21 DIAGNOSIS — F331 Major depressive disorder, recurrent, moderate: Secondary | ICD-10-CM

## 2020-09-21 MED ORDER — DULOXETINE HCL 60 MG PO CPEP: 120.0000 mg | ORAL_CAPSULE | Freq: Every day | ORAL | 2 refills | Status: DC

## 2020-10-07 ENCOUNTER — Other Ambulatory Visit: Payer: Self-pay | Admitting: *Deleted

## 2020-10-07 MED ORDER — CYCLOBENZAPRINE HCL 10 MG PO TABS: ORAL_TABLET | ORAL | 1 refills | Status: DC

## 2020-10-07 MED ORDER — TRIAMCINOLONE ACETONIDE 0.1 % MT PSTE: PASTE | OROMUCOSAL | 1 refills | Status: DC

## 2020-10-13 ENCOUNTER — Other Ambulatory Visit: Payer: Self-pay | Admitting: Neurology

## 2020-10-13 ENCOUNTER — Other Ambulatory Visit: Payer: Self-pay | Admitting: Nurse Practitioner

## 2020-10-13 DIAGNOSIS — F5101 Primary insomnia: Secondary | ICD-10-CM

## 2020-10-13 MED ORDER — FUROSEMIDE 40 MG PO TABS: 40.0000 mg | ORAL_TABLET | Freq: Every day | ORAL | 0 refills | Status: DC | PRN

## 2020-10-23 ENCOUNTER — Ambulatory Visit (INDEPENDENT_AMBULATORY_CARE_PROVIDER_SITE_OTHER): Payer: Medicare Other | Admitting: Physician Assistant

## 2020-10-23 DIAGNOSIS — Z131 Encounter for screening for diabetes mellitus: Secondary | ICD-10-CM

## 2020-10-23 DIAGNOSIS — E785 Hyperlipidemia, unspecified: Secondary | ICD-10-CM

## 2020-10-23 DIAGNOSIS — Z0000 Encounter for general adult medical examination without abnormal findings: Secondary | ICD-10-CM

## 2020-10-23 DIAGNOSIS — E039 Hypothyroidism, unspecified: Secondary | ICD-10-CM

## 2020-10-23 DIAGNOSIS — Z5329 Procedure and treatment not carried out because of patient's decision for other reasons: Secondary | ICD-10-CM

## 2020-10-23 NOTE — Progress Notes (Signed)
No show

## 2020-10-30 DIAGNOSIS — M5136 Other intervertebral disc degeneration, lumbar region: Secondary | ICD-10-CM | POA: Diagnosis not present

## 2020-10-30 DIAGNOSIS — M5032 Other cervical disc degeneration, mid-cervical region, unspecified level: Secondary | ICD-10-CM | POA: Diagnosis not present

## 2020-10-30 DIAGNOSIS — Z79891 Long term (current) use of opiate analgesic: Secondary | ICD-10-CM | POA: Diagnosis not present

## 2020-10-30 DIAGNOSIS — M461 Sacroiliitis, not elsewhere classified: Secondary | ICD-10-CM | POA: Diagnosis not present

## 2020-10-30 DIAGNOSIS — M47816 Spondylosis without myelopathy or radiculopathy, lumbar region: Secondary | ICD-10-CM | POA: Diagnosis not present

## 2020-12-03 ENCOUNTER — Encounter: Payer: Self-pay | Admitting: Family Medicine

## 2020-12-03 ENCOUNTER — Telehealth (INDEPENDENT_AMBULATORY_CARE_PROVIDER_SITE_OTHER): Payer: Medicare Other | Admitting: Family Medicine

## 2020-12-03 DIAGNOSIS — J0140 Acute pansinusitis, unspecified: Secondary | ICD-10-CM

## 2020-12-03 MED ORDER — FLUTICASONE PROPIONATE 50 MCG/ACT NA SUSP: 2.0000 | Freq: Every day | NASAL | 2 refills | Status: DC

## 2020-12-03 MED ORDER — DOXYCYCLINE HYCLATE 100 MG PO TABS: 100.0000 mg | ORAL_TABLET | Freq: Two times a day (BID) | ORAL | 0 refills | Status: AC

## 2020-12-03 NOTE — Patient Instructions (Signed)

## 2020-12-03 NOTE — Progress Notes (Signed)
Virtual Visit via Telephone Note  I connected with  Rachael Douglas on 12/03/20 at 10:10 AM EDT by telephone and verified that I am speaking with the correct person using two identifiers.   I discussed the limitations, risks, security and privacy concerns of performing an evaluation and management service by telephone and the availability of in person appointments. I also discussed with the patient that there may be a patient responsible charge related to this service. The patient expressed understanding and agreed to proceed.  Participating parties included in this telephone visit include: The patient and the nurse practitioner listed.  The patient is: At home I am: In the office  Subjective:    CC: possible sinus infection   HPI: Rachael Douglas is a 65 y.o. year old female presenting today via telephone visit to discuss possible sinus infection.  Patient reports she started with a cold about 3-4 weeks ago and it has progressively worsened. She reports trying to take Zycam, Benadryl, and Sudafed without much improvement. She is reporting rhinorrhea, nasal congestion and inflammation, occasional bilateral ear pressure, headache with pansinusitis pressure of 13/10 on pain scale. She reports an occasional cough to clear the post-nasal drip, but no shortness of breath, chest pain, or fevers.    Past medical history, Surgical history, Family history not pertinant except as noted below, Social history, Allergies, and medications have been entered into the medical record, reviewed, and corrections made.   Review of Systems:  All review of systems negative except what is listed in the HPI  Objective:    General:  Patient speaking clearly in complete sentences. No shortness of breath noted.   Alert and oriented x3.   Normal judgment.  No apparent acute distress.  Impression and Recommendations:    1. Acute non-recurrent pansinusitis  Given prolonged duration of illness, will go ahead  and treat sinusitis with doxycycline. She denies any sick contacts, and would be out of window for COVID quarantine anyway. Recommend she continue supportive therapy including flonase, OTC analgesics, humidifier use, steam shower, warm compresses, saline nasal spray/rinse, rest and hydration. Educated on signs and symptoms that would require further evaluation.   - doxycycline (VIBRA-TABS) 100 MG tablet; Take 1 tablet (100 mg total) by mouth 2 (two) times daily for 7 days.  Dispense: 14 tablet; Refill: 0 - fluticasone (FLONASE) 50 MCG/ACT nasal spray; Place 2 sprays into both nostrils daily.  Dispense: 16 g; Refill: 2  Follow-up if symptoms worsen or fail to improve.    I discussed the assessment and treatment plan with the patient. The patient was provided an opportunity to ask questions and all were answered. The patient agreed with the plan and demonstrated an understanding of the instructions.   The patient was advised to call back or seek an in-person evaluation if the symptoms worsen or if the condition fails to improve as anticipated.  I provided 20 minutes of non-face-to-face time during this TELEPHONE encounter.    Lollie Marrow Reola Calkins, DNP, FNP-C

## 2020-12-13 ENCOUNTER — Other Ambulatory Visit: Payer: Self-pay | Admitting: Physician Assistant

## 2020-12-13 DIAGNOSIS — F331 Major depressive disorder, recurrent, moderate: Secondary | ICD-10-CM

## 2020-12-17 ENCOUNTER — Other Ambulatory Visit: Payer: Self-pay | Admitting: Nurse Practitioner

## 2020-12-17 ENCOUNTER — Other Ambulatory Visit: Payer: Self-pay | Admitting: Physician Assistant

## 2020-12-17 DIAGNOSIS — F419 Anxiety disorder, unspecified: Secondary | ICD-10-CM

## 2020-12-17 NOTE — Telephone Encounter (Signed)
Last written 07/09/2020 for 6 months Last appt 10/23/2020

## 2020-12-22 ENCOUNTER — Encounter: Payer: Medicare Other | Admitting: Physician Assistant

## 2021-01-03 ENCOUNTER — Other Ambulatory Visit: Payer: Self-pay | Admitting: Nurse Practitioner

## 2021-01-03 DIAGNOSIS — M810 Age-related osteoporosis without current pathological fracture: Secondary | ICD-10-CM

## 2021-01-06 ENCOUNTER — Other Ambulatory Visit: Payer: Self-pay | Admitting: Physician Assistant

## 2021-01-11 DIAGNOSIS — M47816 Spondylosis without myelopathy or radiculopathy, lumbar region: Secondary | ICD-10-CM | POA: Diagnosis not present

## 2021-01-11 DIAGNOSIS — M5136 Other intervertebral disc degeneration, lumbar region: Secondary | ICD-10-CM | POA: Diagnosis not present

## 2021-01-11 DIAGNOSIS — M5032 Other cervical disc degeneration, mid-cervical region, unspecified level: Secondary | ICD-10-CM | POA: Diagnosis not present

## 2021-01-11 DIAGNOSIS — M461 Sacroiliitis, not elsewhere classified: Secondary | ICD-10-CM | POA: Diagnosis not present

## 2021-01-11 DIAGNOSIS — Z79891 Long term (current) use of opiate analgesic: Secondary | ICD-10-CM | POA: Diagnosis not present

## 2021-01-12 ENCOUNTER — Other Ambulatory Visit: Payer: Self-pay | Admitting: Nurse Practitioner

## 2021-01-12 DIAGNOSIS — E039 Hypothyroidism, unspecified: Secondary | ICD-10-CM

## 2021-01-13 ENCOUNTER — Other Ambulatory Visit: Payer: Self-pay | Admitting: Nurse Practitioner

## 2021-01-13 DIAGNOSIS — M5416 Radiculopathy, lumbar region: Secondary | ICD-10-CM

## 2021-01-25 IMAGING — DX DG ANKLE COMPLETE 3+V*R*
3 series · 3 of 3 positions shown · non-contrast
Comparison: None.

CLINICAL DATA: Ankle pain anteriorly, no known injury, initial
encounter

EXAM:
RIGHT ANKLE - COMPLETE 3+ VIEW

[ankle ap]
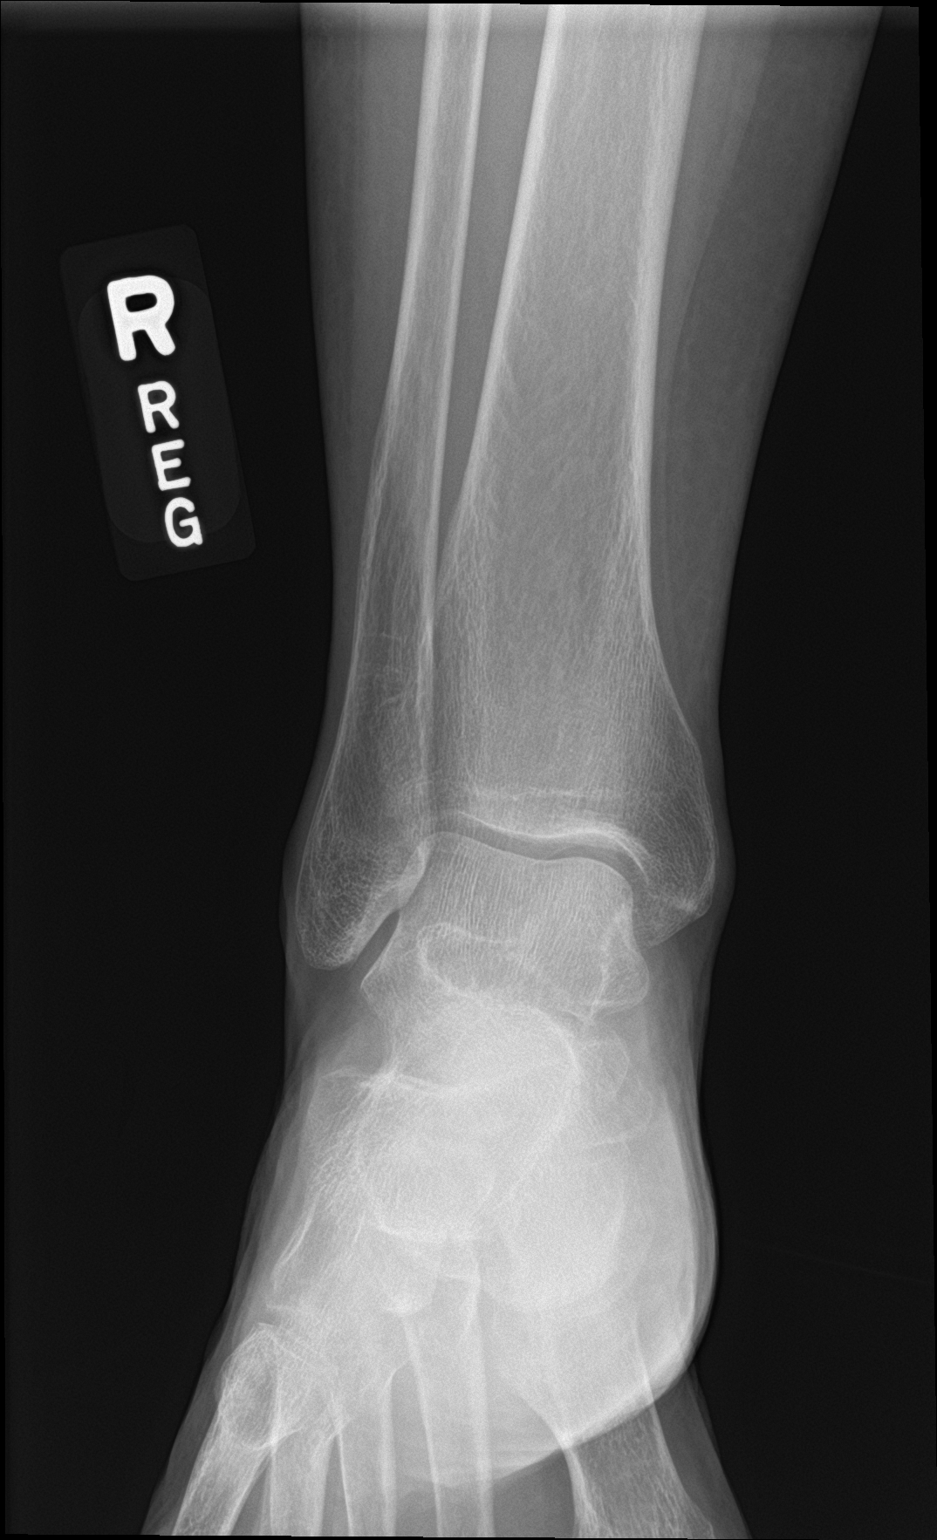

[ankle obl]
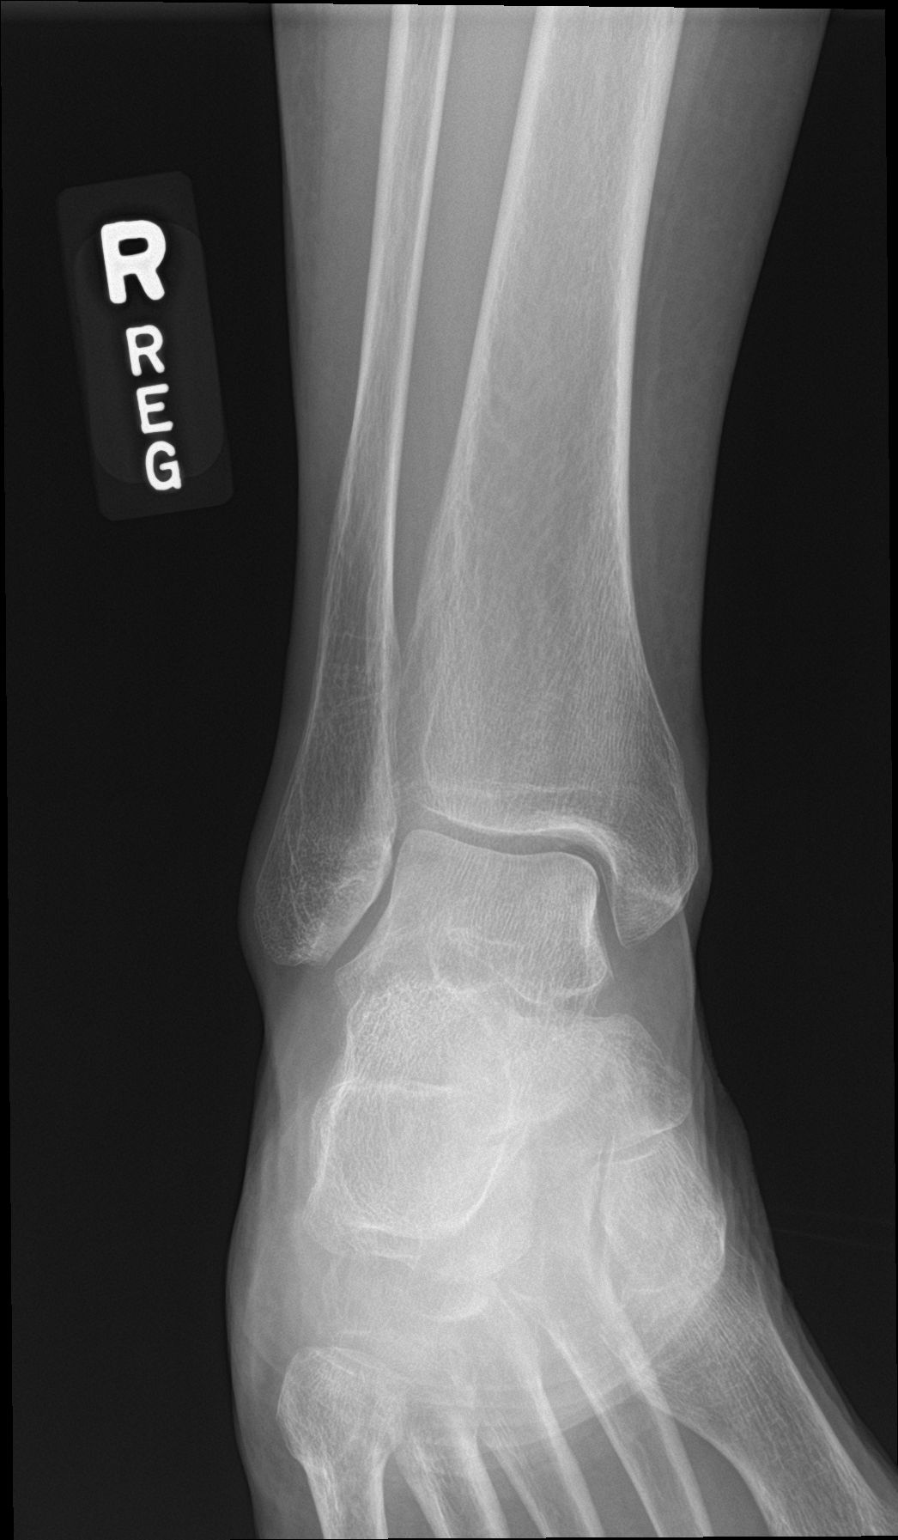

[ankle lat]
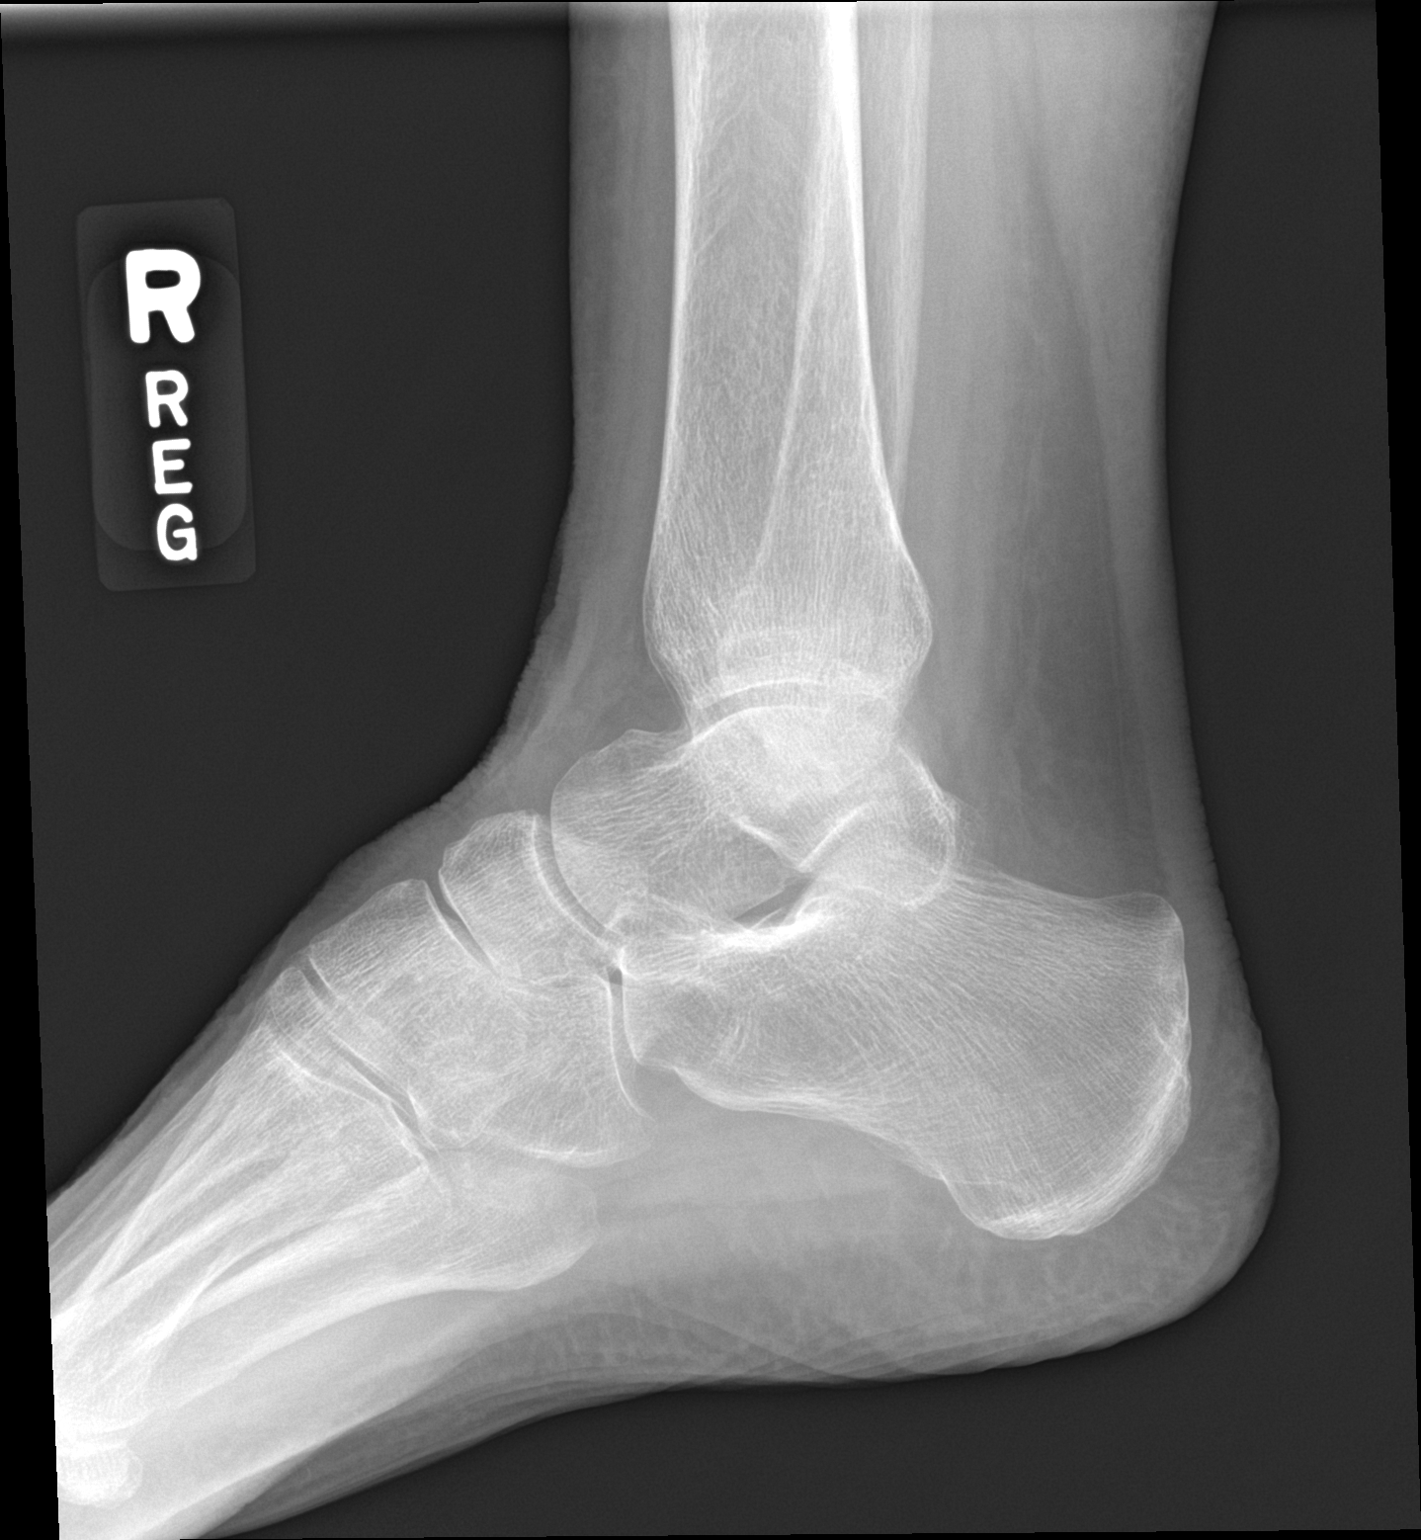

[3 of 3 positions shown; findings below may reference images not displayed]

FINDINGS: There is no evidence of fracture, dislocation, or joint effusion.
There is no evidence of arthropathy or other focal bone abnormality.
Soft tissues are unremarkable.
IMPRESSION: No acute abnormality noted.

## 2021-01-25 IMAGING — DX DG KNEE COMPLETE 4+V*R*
4 series · 4 of 4 positions shown · non-contrast
Comparison: None.

CLINICAL DATA: Right knee pain

EXAM:
RIGHT KNEE - COMPLETE 4+ VIEW

[tunnel]
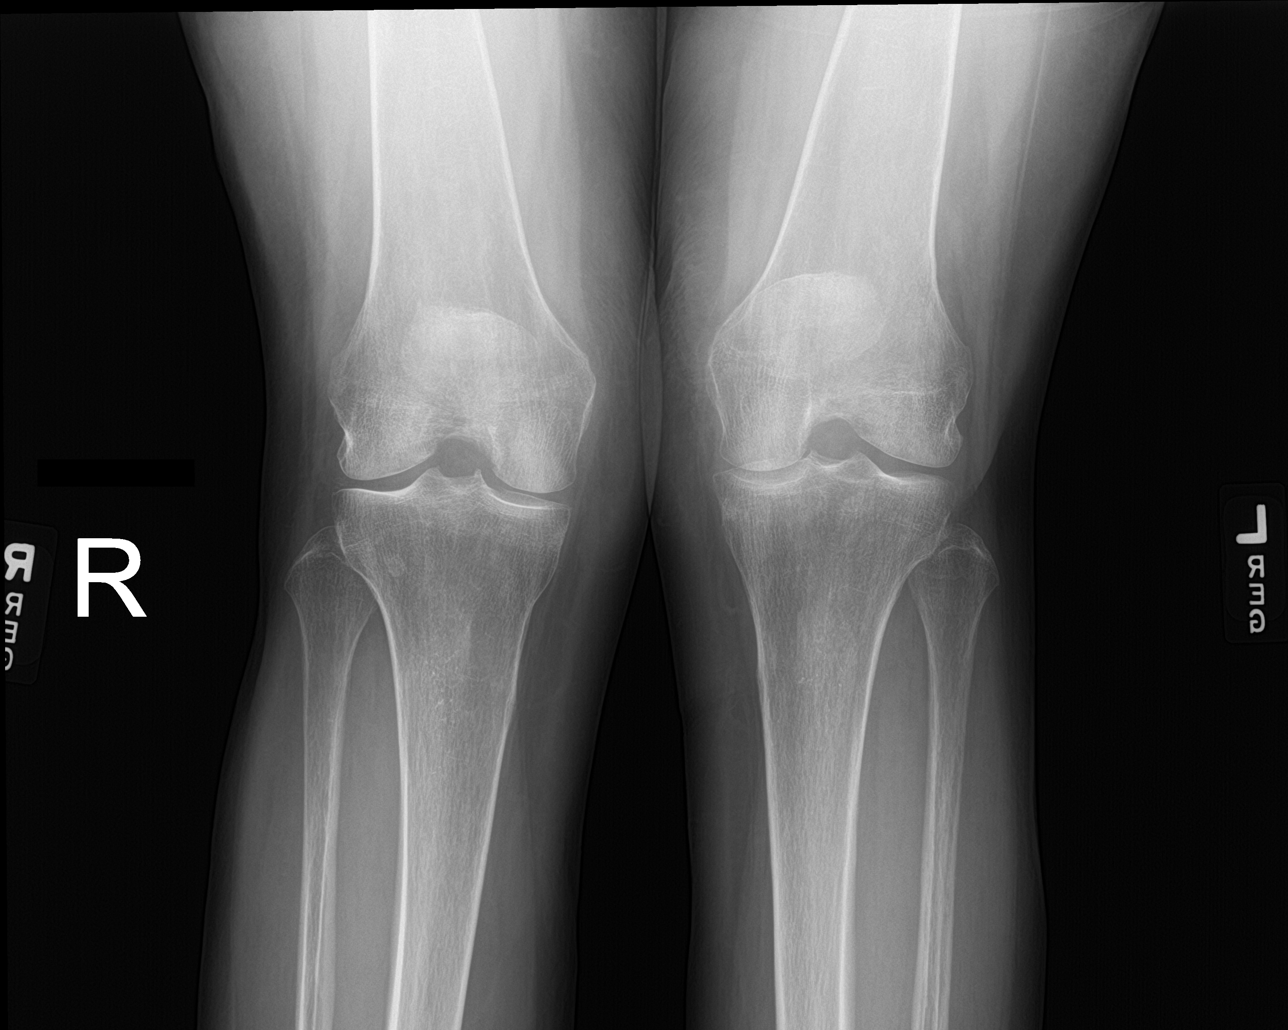

[knee lat]
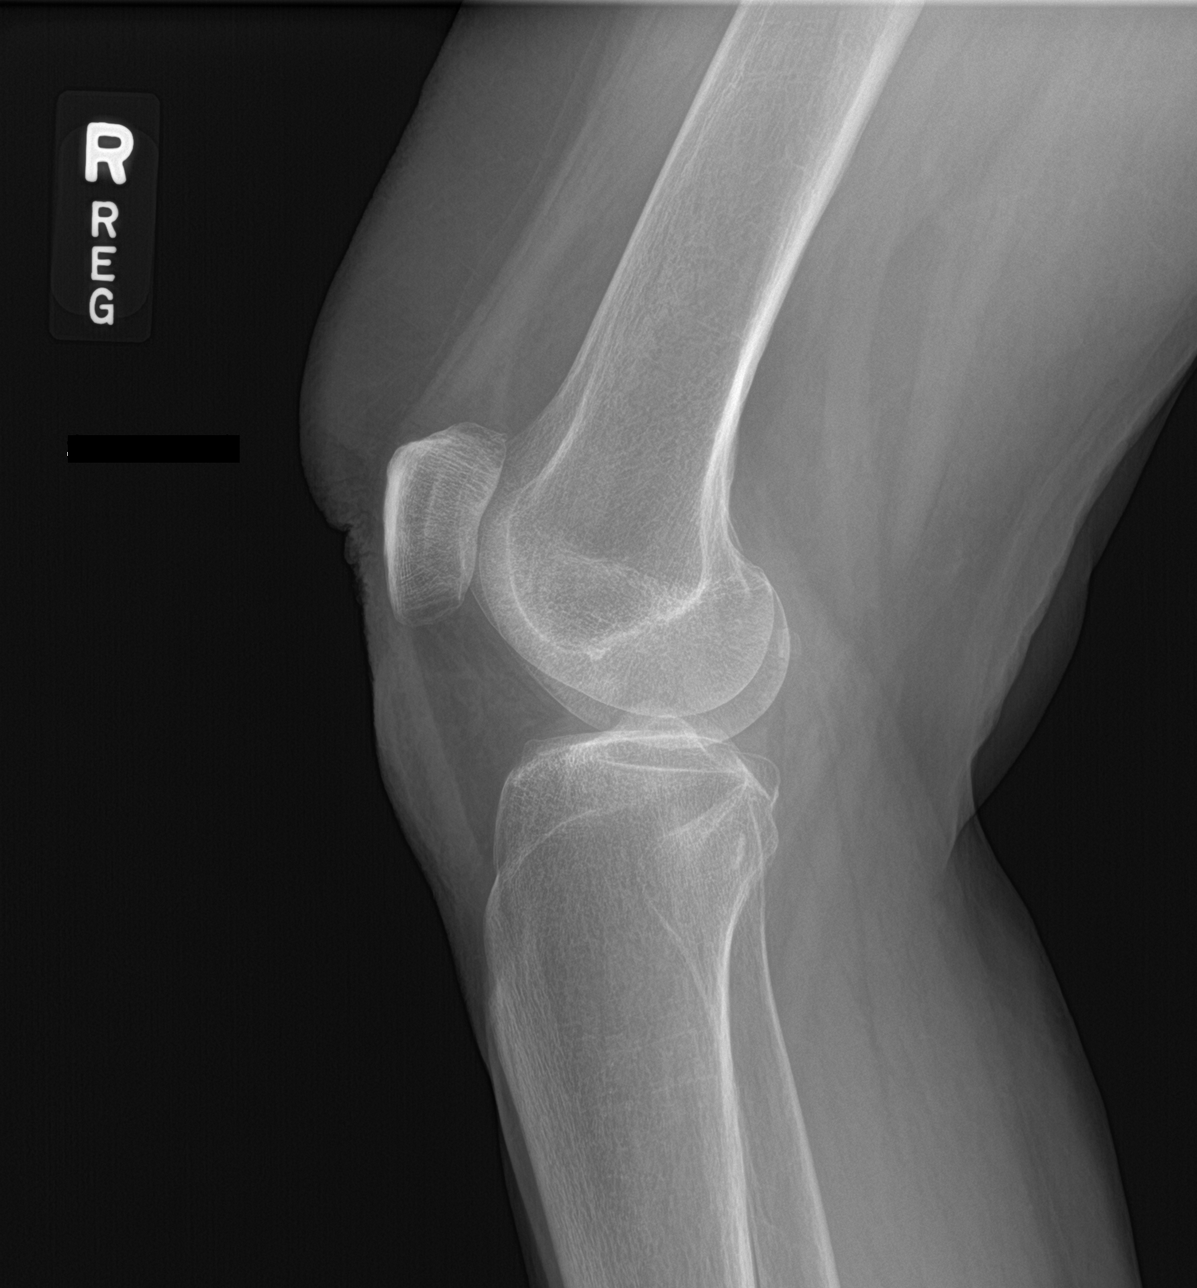

[knee sunrise]
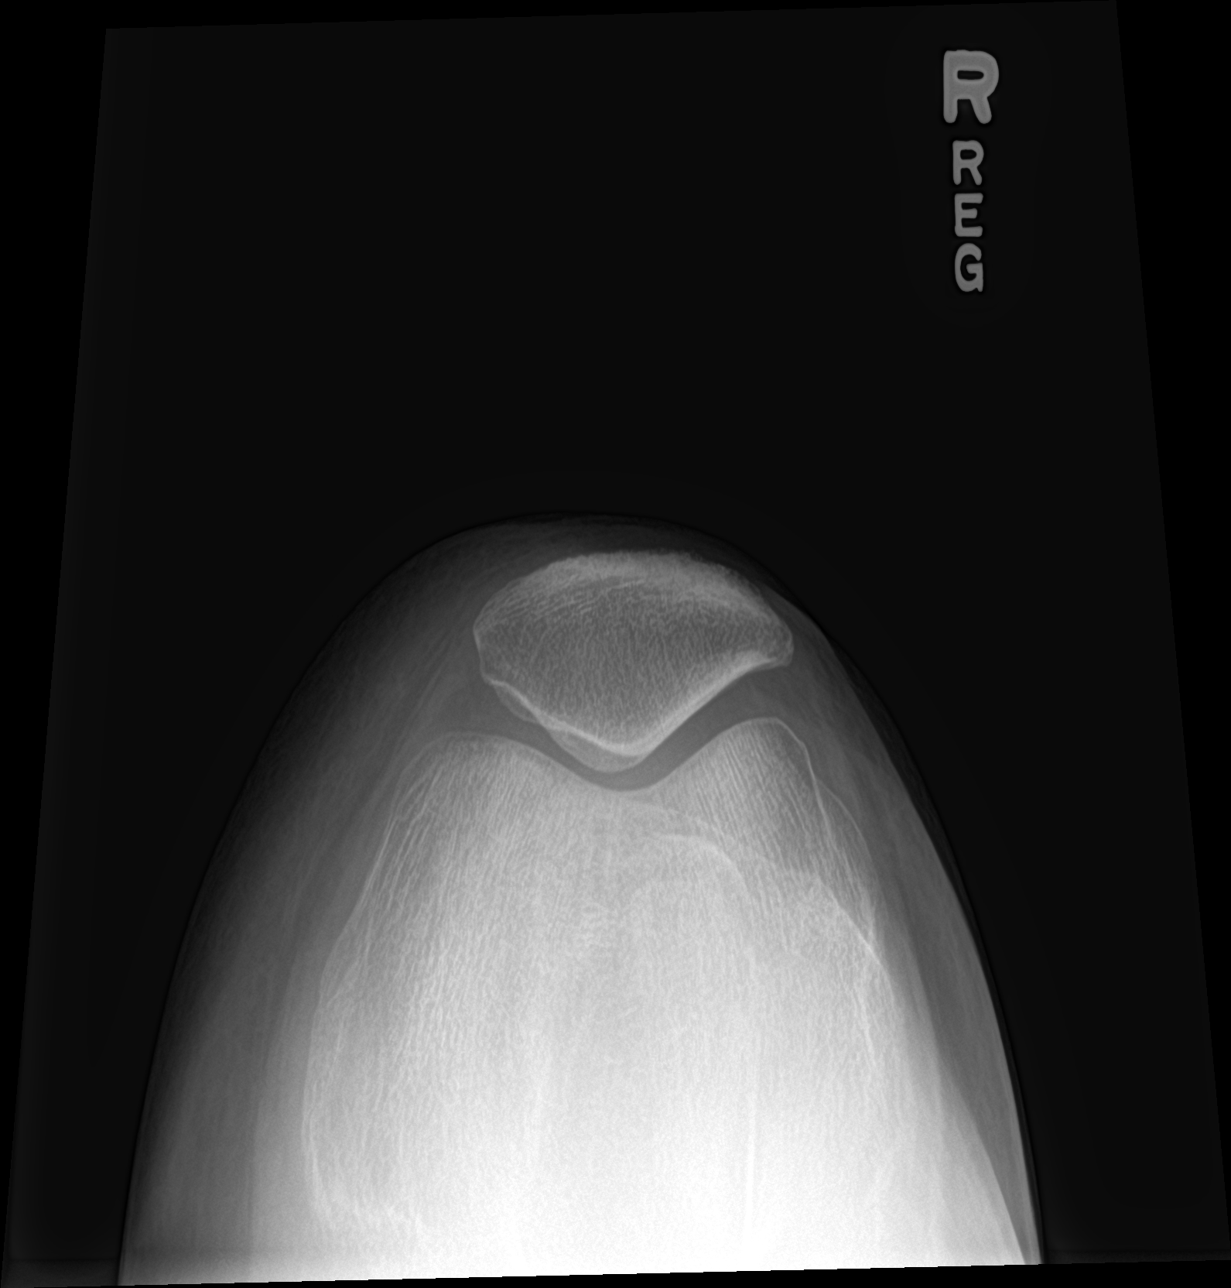

[knee ap bilat standing]
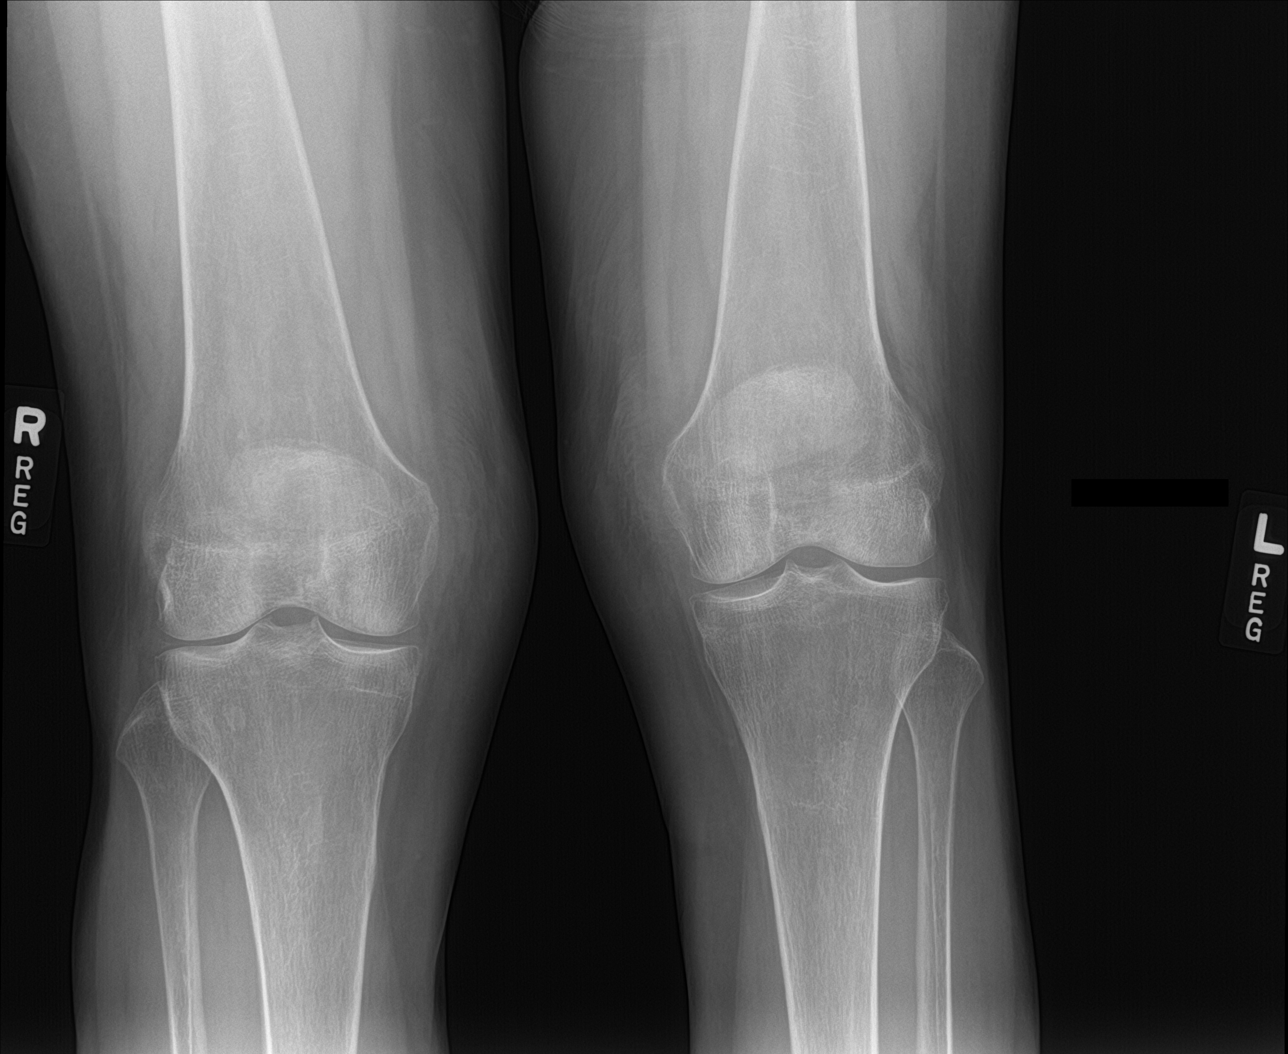

[4 of 4 positions shown; findings below may reference images not displayed]

FINDINGS: No evidence of fracture, dislocation, or joint effusion. No evidence
of arthropathy or other focal bone abnormality. Soft tissues are
unremarkable.
IMPRESSION: No acute abnormality noted.

## 2021-02-11 ENCOUNTER — Other Ambulatory Visit: Payer: Self-pay | Admitting: Nurse Practitioner

## 2021-02-20 DIAGNOSIS — I2510 Atherosclerotic heart disease of native coronary artery without angina pectoris: Secondary | ICD-10-CM | POA: Diagnosis not present

## 2021-02-20 DIAGNOSIS — K219 Gastro-esophageal reflux disease without esophagitis: Secondary | ICD-10-CM | POA: Diagnosis not present

## 2021-02-20 DIAGNOSIS — G8911 Acute pain due to trauma: Secondary | ICD-10-CM | POA: Diagnosis not present

## 2021-02-20 DIAGNOSIS — Z79899 Other long term (current) drug therapy: Secondary | ICD-10-CM | POA: Diagnosis not present

## 2021-02-20 DIAGNOSIS — Z7982 Long term (current) use of aspirin: Secondary | ICD-10-CM | POA: Diagnosis not present

## 2021-02-20 DIAGNOSIS — E079 Disorder of thyroid, unspecified: Secondary | ICD-10-CM | POA: Diagnosis not present

## 2021-02-20 DIAGNOSIS — Z880 Allergy status to penicillin: Secondary | ICD-10-CM | POA: Diagnosis not present

## 2021-02-20 DIAGNOSIS — Z87891 Personal history of nicotine dependence: Secondary | ICD-10-CM | POA: Diagnosis not present

## 2021-02-20 DIAGNOSIS — Z952 Presence of prosthetic heart valve: Secondary | ICD-10-CM | POA: Diagnosis not present

## 2021-02-20 DIAGNOSIS — S52502A Unspecified fracture of the lower end of left radius, initial encounter for closed fracture: Secondary | ICD-10-CM | POA: Diagnosis not present

## 2021-02-20 DIAGNOSIS — S52592A Other fractures of lower end of left radius, initial encounter for closed fracture: Secondary | ICD-10-CM | POA: Diagnosis not present

## 2021-02-20 DIAGNOSIS — J449 Chronic obstructive pulmonary disease, unspecified: Secondary | ICD-10-CM | POA: Diagnosis not present

## 2021-02-23 DIAGNOSIS — S52502A Unspecified fracture of the lower end of left radius, initial encounter for closed fracture: Secondary | ICD-10-CM | POA: Diagnosis not present

## 2021-02-23 DIAGNOSIS — M25532 Pain in left wrist: Secondary | ICD-10-CM | POA: Diagnosis not present

## 2021-03-01 ENCOUNTER — Ambulatory Visit (INDEPENDENT_AMBULATORY_CARE_PROVIDER_SITE_OTHER): Payer: Medicare Other | Admitting: Physician Assistant

## 2021-03-01 ENCOUNTER — Other Ambulatory Visit: Payer: Self-pay | Admitting: Neurology

## 2021-03-01 DIAGNOSIS — Z780 Asymptomatic menopausal state: Secondary | ICD-10-CM

## 2021-03-01 DIAGNOSIS — Z0000 Encounter for general adult medical examination without abnormal findings: Secondary | ICD-10-CM | POA: Diagnosis not present

## 2021-03-01 DIAGNOSIS — Z1231 Encounter for screening mammogram for malignant neoplasm of breast: Secondary | ICD-10-CM

## 2021-03-01 DIAGNOSIS — S52502A Unspecified fracture of the lower end of left radius, initial encounter for closed fracture: Secondary | ICD-10-CM | POA: Diagnosis not present

## 2021-03-01 DIAGNOSIS — R110 Nausea: Secondary | ICD-10-CM | POA: Diagnosis not present

## 2021-03-01 MED ORDER — FUROSEMIDE 40 MG PO TABS: 40.0000 mg | ORAL_TABLET | Freq: Every day | ORAL | 0 refills | Status: DC | PRN

## 2021-03-01 NOTE — Patient Instructions (Signed)
MEDICARE ANNUAL WELLNESS VISIT Health Maintenance Summary and Written Plan of Care  Ms. Podgurski ,  Thank you for allowing me to perform your Medicare Annual Wellness Visit and for your ongoing commitment to your health.   Health Maintenance & Immunization History Health Maintenance  Topic Date Due   COVID-19 Vaccine (4 - Booster for Pfizer series) 03/17/2021 (Originally 10/29/2020)   Zoster Vaccines- Shingrix (1 of 2) 06/01/2021 (Originally 12/06/1973)   MAMMOGRAM  03/01/2022 (Originally 06/06/2020)   PNA vac Low Risk Adult (2 of 2 - PPSV23) 03/01/2022 (Originally 12/07/2019)   INFLUENZA VACCINE  04/19/2021   TETANUS/TDAP  01/31/2022   COLONOSCOPY (Pts 45-49yrs Insurance coverage will need to be confirmed)  09/05/2026   DEXA SCAN  Completed   Hepatitis C Screening  Completed   HPV VACCINES  Aged Out   Immunization History  Administered Date(s) Administered   Influenza, Seasonal, Injecte, Preservative Fre 08/08/2013   Influenza,inj,Quad PF,6+ Mos 08/08/2013, 10/07/2015, 11/23/2016, 05/31/2018, 07/09/2019   Influenza,trivalent, recombinat, inj, PF 07/07/2010   PFIZER(Purple Top)SARS-COV-2 Vaccination 12/21/2019, 01/11/2020, 07/29/2020   Pneumococcal Conjugate-13 11/23/2016   Pneumococcal Polysaccharide-23 07/07/2010   Tdap 02/01/2012    These are the patient goals that we discussed:  Goals Addressed               This Visit's Progress     Patient Stated (pt-stated)        03/01/2021 AWV Goal: Exercise for General Health  Patient will verbalize understanding of the benefits of increased physical activity: Exercising regularly is important. It will improve your overall fitness, flexibility, and endurance. Regular exercise also will improve your overall health. It can help you control your weight, reduce stress, and improve your bone density. Over the next year, patient will increase physical activity as tolerated with a goal of at least 150 minutes of moderate physical  activity per week.  You can tell that you are exercising at a moderate intensity if your heart starts beating faster and you start breathing faster but can still hold a conversation. Moderate-intensity exercise ideas include: Walking 1 mile (1.6 km) in about 15 minutes Biking Hiking Golfing Dancing Water aerobics Patient will verbalize understanding of everyday activities that increase physical activity by providing examples like the following: Yard work, such as: Insurance underwriter Gardening Washing windows or floors Patient will be able to explain general safety guidelines for exercising:  Before you start a new exercise program, talk with your health care provider. Do not exercise so much that you hurt yourself, feel dizzy, or get very short of breath. Wear comfortable clothes and wear shoes with good support. Drink plenty of water while you exercise to prevent dehydration or heat stroke. Work out until your breathing and your heartbeat get faster.           This is a list of Health Maintenance Items that are overdue or due now: There are no preventive care reminders to display for this patient.   Orders/Referrals Placed Today: Orders Placed This Encounter  Procedures   Mammogram 3D SCREEN BREAST BILATERAL    Standing Status:   Future    Standing Expiration Date:   03/01/2022    Scheduling Instructions:     Please call patient to schedule. She would need an appt for sometime after July/August.    Order Specific Question:   Reason for Exam (SYMPTOM  OR DIAGNOSIS REQUIRED)    Answer:  Breast cancer screening    Order Specific Question:   Preferred imaging location?    Answer:   [facility]    Standing Status:   Future    Standing Expiration Date:   03/01/2022    Scheduling Instructions:     Please call patient to schedule. She would need an appt for sometime after  July/August.    Order Specific Question:   Reason for exam:    Answer:   post menopausal    Order Specific Question:   Preferred imaging location?    Answer:   [facility]   (Contact our referral department at [phone] if you have not spoken with someone about your referral appointment within the next 5 days)    Follow-up Plan

## 2021-03-01 NOTE — Progress Notes (Signed)
MEDICARE ANNUAL WELLNESS VISIT  03/01/2021  Telephone Visit Disclaimer This Medicare AWV was conducted by telephone due to national recommendations for restrictions regarding the COVID-19 Pandemic (e.g. social distancing).  I verified, using two identifiers, that I am speaking with Rachael Douglas or their authorized healthcare agent. I discussed the limitations, risks, security, and privacy concerns of performing an evaluation and management service by telephone and the potential availability of an in-person appointment in the future. The patient expressed understanding and agreed to proceed.  Location of Patient: Home Location of Provider (nurse):  In the office.  Subjective:    Rachael Douglas is a 65 y.o. female patient of Alden Hipp, Royetta Car, PA-C who had a [employer] Visit today via telephone. [name] is Retired and lives with their spouse. she has 2 children. she reports that she is socially active and does interact with friends/family regularly. she is minimally physically active and enjoys gardening.  Patient Care Team: Lavada Mesi as PCP - General (Family Medicine)  Advanced Directives 03/01/2021 09/25/2014  Does Patient Have a Medical Advance Directive? No No  Would patient like information on creating a medical advance directive? No - Patient declined Yes - Educational materials given    Hospital Utilization Over the Past 12 Months: # of hospitalizations or ER visits: 1 # of surgeries: 0  Review of Systems    Patient reports that her overall health is unchanged compared to last year.  History obtained from chart review and the patient  Patient Reported Readings (BP, Pulse, CBG, Weight, etc) none  Pain Assessment Pain : 0-10 Pain Score: 10-Worst pain ever Pain Type: Acute pain Pain Location: Wrist Pain Orientation: Left Pain Descriptors / Indicators: Aching, Constant Pain Onset: In the past 7 days Pain Frequency: Constant Pain Relieving  Factors: rest  Pain Relieving Factors: rest  Current Medications & Allergies (verified) Allergies as of 03/01/2021       Reactions   Penicillins Swelling   Ranitidine Other (See Comments)   Facial swelling   Alendronate Other (See Comments)   unknown   Bisphosphonates    Nausea/stomach pains.         Medication List        Accurate as of March 01, 2021  1:46 PM. If you have any questions, ask your nurse or doctor.          albuterol 108 (90 Base) MCG/ACT inhaler Commonly known as: VENTOLIN HFA INHALE 2 PUFFS EVERY 6 HOURS AS NEEDED   cyclobenzaprine 10 MG tablet Commonly known as: FLEXERIL TAKE 1 TABLET(10 MG) BY MOUTH THREE TIMES DAILY   donepezil 10 MG tablet Commonly known as: ARICEPT Take 1 tablet (10 mg total) by mouth at bedtime.   DULoxetine 60 MG capsule Commonly known as: CYMBALTA TAKE 2 CAPSULES(120 MG) BY MOUTH DAILY   Easy-Lax Plus 8.6-50 MG tablet Generic drug: senna-docusate TAKE 2 TABLETS BY MOUTH DAILY FOR 10 DAYS   fluticasone 50 MCG/ACT nasal spray Commonly known as: Flonase Place 2 sprays into both nostrils daily.   furosemide 40 MG tablet Commonly known as: LASIX Take 1 tablet (40 mg total) by mouth daily as needed (swelling).   gabapentin 600 MG tablet Commonly known as: NEURONTIN TAKE 2 TABLETS(1200 MG) BY MOUTH THREE TIMES DAILY   hydrOXYzine 25 MG tablet Commonly known as: ATARAX/VISTARIL Take 1 tablet (25 mg total) by mouth at bedtime. Needs appt   ibuprofen 800 MG tablet Commonly known as: ADVIL Take 800 mg by  mouth every 6 (six) hours as needed.   levothyroxine 75 MCG tablet Commonly known as: SYNTHROID TAKE 1 TABLET(75 MCG) BY MOUTH DAILY BEFORE BREAKFAST   Linzess 290 MCG Caps capsule Generic drug: linaclotide TAKE 1 CAPSULE(290 MCG) BY MOUTH DAILY   metoprolol tartrate 25 MG tablet Commonly known as: LOPRESSOR Take 1 tablet (25 mg total) by mouth 2 (two) times daily. appt for refills   mometasone-formoterol  200-5 MCG/ACT Aero Commonly known as: DULERA Inhale 1 puff into the lungs daily.   naloxone 4 MG/0.1ML Liqd nasal spray kit Commonly known as: NARCAN USE 1 SPRAY INTRANASALLY EVERY 2-3 MINUTES UNTIL EMERGENCY TEAM HAS ARRIVED. USE IN OPIOD EMERGENCY ONLY AS DIRECTED.   nystatin 100000 UNIT/ML suspension Commonly known as: MYCOSTATIN Take 5 mLs (500,000 Units total) by mouth 4 (four) times daily. Swish for 30 seconds and spit out.   ondansetron 4 MG disintegrating tablet Commonly known as: Zofran ODT Take 1 tablet (4 mg total) by mouth every 8 (eight) hours as needed for nausea or vomiting.   oxyCODONE 5 MG immediate release tablet Commonly known as: Oxy IR/ROXICODONE Take 5 mg by mouth 4 (four) times daily as needed.   oxyCODONE-acetaminophen 10-325 MG tablet Commonly known as: PERCOCET Take 1 tablet by mouth every 6 (six) hours as needed.   pantoprazole 40 MG tablet Commonly known as: PROTONIX Take 1 tablet (40 mg total) by mouth daily.   potassium chloride 10 MEQ tablet Commonly known as: KLOR-CON TAKE 1 TABLET(10 MEQ) BY MOUTH DAILY   pravastatin 80 MG tablet Commonly known as: PRAVACHOL TAKE 1 TABLET(80 MG) BY MOUTH DAILY   triamcinolone 0.1 % paste Commonly known as: KENALOG APPLY A THIN LAYER ON MOUTH ULCERS 2 TO 3 TIMES DAILY AFTER MEALS FOR 5 TO 7 DAYS   triamcinolone cream 0.1 % Commonly known as: KENALOG APPLY TOPICALLY TO THE AFFECTED AREA TWICE DAILY. AVOID FACE   Vitamin D (Ergocalciferol) 1.25 MG (50000 UNIT) Caps capsule Commonly known as: DRISDOL TAKE 1 CAPSULE BY MOUTH EVERY 7 DAYS/ appt for refills   zolpidem 10 MG tablet Commonly known as: AMBIEN TAKE 1 TABLET(10 MG) BY MOUTH AT BEDTIME AS NEEDED FOR SLEEP        History (reviewed): Past Medical History:  Diagnosis Date   Arthritis    Heart disease    Hyperlipidemia    Hypertension    PUD (peptic ulcer disease)    Thyroid disease    Past Surgical History:  Procedure Laterality Date    AORTIC VALVE REPLACEMENT     TOTAL ABDOMINAL HYSTERECTOMY     Family History  Problem Relation Age of Onset   Cancer Other    Depression Other    Diabetes Other    Hypertension Other    Breast cancer Other    Social History   Socioeconomic History   Marital status: Married    Spouse name: Rachael Douglas   Number of children: 2   Years of education: 12   Highest education level: 12th grade  Occupational History    Comment: Retired  Tobacco Use   Smoking status: Former    Pack years: 0.00    Types: E-cigarettes    Quit date: 05/30/2014    Years since quitting: 6.7   Smokeless tobacco: Never  Vaping Use   Vaping Use: Some days   Substances: Nicotine  Substance and Sexual Activity   Alcohol use: No    Alcohol/week: 0.0 standard drinks   Drug use: No   Sexual activity:  Not Currently  Other Topics Concern   Not on file  Social History Narrative   Lives with her husband. She enjoys yard work.    Social Determinants of Health   Financial Resource Strain: Low Risk    Difficulty of Paying Living Expenses: Not hard at all  Food Insecurity: No Food Insecurity   Worried About Charity fundraiser in the Last Year: Never true   [place] in the Last Year: Never true  Transportation Needs: No Transportation Needs   Lack of Transportation (Medical): No   Lack of Transportation (Non-Medical): No  Physical Activity: Inactive   Days of Exercise per Week: 0 days   Minutes of Exercise per Session: 0 min  Stress: No Stress Concern Present   Feeling of Stress : Only a little  Social Connections: Moderately Isolated   Frequency of Communication with Friends and Family: More than three times a week   Frequency of Social Gatherings with Friends and Family: Three times a week   Attends Religious Services: Never   Active Member of Clubs or Organizations: No   Attends Archivist Meetings: Never   Marital Status: Married    Activities of Daily Living In your present state  of health, do you have any difficulty performing the following activities: 03/01/2021  Hearing? N  Vision? N  Difficulty concentrating or making decisions? Y  Comment has difficulty remembering (she is curently on aricept)  Walking or climbing stairs? N  Dressing or bathing? Y  Comment has a broken wrist currently. (her daughter helps with bathing)  Doing errands, shopping? Y  Comment her husband/her daughter help with that due to her broken wrist  Preparing Food and eating ? Y  Comment she has a broken wrist  Using the Toilet? N  In the past six months, have you accidently leaked urine? Y  Comment Urine urgency  Do you have problems with loss of bowel control? N  Managing your Medications? Y  Comment her daughter is helping with the pill box due to her broken wrist  Managing your Finances? N  Comment her husband helps with that  Housekeeping or managing your Housekeeping? N  Comment her husband helps with that  Some recent data might be hidden    Patient Education/ Literacy How often do you need to have someone help you when you read instructions, pamphlets, or other written materials from your doctor or pharmacy?: 1 - Never What is the last grade level you completed in school?: 12th  Exercise Current Exercise Habits: The patient does not participate in regular exercise at present, Exercise limited by: orthopedic condition(s)  Diet Patient reports consuming 1 meals a day and 2 snack(s) a day Patient reports that her primary diet is: Regular Patient reports that she does have regular access to food.   Depression Screen PHQ 2/9 Scores 03/01/2021 07/24/2019 04/01/2019 01/08/2019 09/03/2018 06/25/2018 11/27/2017  PHQ - 2 Score 2 0 6 5 6 5 6   PHQ- 9 Score 3 5 18 12 15 16 18   Exception Documentation - - - - - - -     Fall Risk Fall Risk  03/01/2021 07/26/2019 04/05/2019  Falls in the past year? 1 1 1   Number falls in past yr: 1 1 1   Injury with Fall? 1 1 0  Risk for fall due to :  History of fall(s) History of fall(s) History of fall(s);Orthopedic patient  Follow up Falls evaluation completed;Education provided;Falls prevention discussed Falls prevention discussed Education  provided;Falls prevention discussed     Objective:  Haili A Straley seemed alert and oriented and she participated appropriately during our telephone visit.  Blood Pressure Weight BMI  BP Readings from Last 3 Encounters:  08/10/20 127/75  02/26/20 136/72  02/25/20 111/70   Wt Readings from Last 3 Encounters:  08/10/20 121 lb (54.9 kg)  02/26/20 121 lb 14.4 oz (55.3 kg)  01/28/20 122 lb (55.3 kg)   BMI Readings from Last 1 Encounters:  08/10/20 20.77 kg/m    *Unable to obtain current vital signs, weight, and BMI due to telephone visit type  Hearing/Vision  Zeola did not seem to have difficulty with hearing/understanding during the telephone conversation Reports that she has not had a formal eye exam by an eye care professional within the past year Reports that she has not had a formal hearing evaluation within the past year *Unable to fully assess hearing and vision during telephone visit type  Cognitive Function: 6CIT Screen 03/01/2021  What Year? 0 points  What month? 0 points  What time? 0 points  Count back from 20 0 points  Months in reverse 2 points  Repeat phrase 0 points  Total Score 2   (Normal:0-7, Significant for Dysfunction: >8)  Normal Cognitive Function Screening: Yes   Immunization & Health Maintenance Record Immunization History  Administered Date(s) Administered   Influenza, Seasonal, Injecte, Preservative Fre 08/08/2013   Influenza,inj,Quad PF,6+ Mos 08/08/2013, 10/07/2015, 11/23/2016, 05/31/2018, 07/09/2019   Influenza,trivalent, recombinat, inj, PF 07/07/2010   PFIZER(Purple Top)SARS-COV-2 Vaccination 12/21/2019, 01/11/2020, 07/29/2020   Pneumococcal Conjugate-13 11/23/2016   Pneumococcal Polysaccharide-23 07/07/2010   Tdap 02/01/2012    Health  Maintenance  Topic Date Due   COVID-19 Vaccine (4 - Booster for Olga series) 03/17/2021 (Originally 10/29/2020)   Zoster Vaccines- Shingrix (1 of 2) 06/01/2021 (Originally 12/06/1974)   MAMMOGRAM  03/01/2022 (Originally 06/06/2020)   PNA vac Low Risk Adult (2 of 2 - PPSV23) 03/01/2022 (Originally 12/07/2019)   INFLUENZA VACCINE  04/19/2021   TETANUS/TDAP  01/31/2022   COLONOSCOPY (Pts 45-13yr Insurance coverage will need to be confirmed)  09/05/2026   DEXA SCAN  Completed   Hepatitis C Screening  Completed   HPV VACCINES  Aged Out       Assessment  This is a routine wellness examination for [employer]  Health Maintenance: Due or Overdue There are no preventive care reminders to display for this patient.   Laketa A Rico does not need a referral for Community Assistance: Care Management:   no Social Work:    no Prescription Assistance:  no Nutrition/Diabetes Education:  no   Plan:  Personalized Goals  Goals Addressed               This Visit's Progress     Patient Stated (pt-stated)        03/01/2021 AWV Goal: Exercise for General Health  Patient will verbalize understanding of the benefits of increased physical activity: Exercising regularly is important. It will improve your overall fitness, flexibility, and endurance. Regular exercise also will improve your overall health. It can help you control your weight, reduce stress, and improve your bone density. Over the next year, patient will increase physical activity as tolerated with a goal of at least 150 minutes of moderate physical activity per week.  You can tell that you are exercising at a moderate intensity if your heart starts beating faster and you start breathing faster but can still hold a conversation. Moderate-intensity exercise ideas include: Walking 1  mile (1.6 km) in about 15 minutes [place] [employer] aer[employer] Patient will verbalize understanding of everyday activities that  increase physical activity by providing examples like the following: Yard work, such as: Sales promotion account executive Gardening Washing windows or floors Patient will be able to explain general safety guidelines for exercising:  Before you start a new exercise program, talk with your health care provider. Do not exercise so much that you hurt yourself, feel dizzy, or get very short of breath. Wear comfortable clothes and wear shoes with good support. Drink plenty of water while you exercise to prevent dehydration or heat stroke. Work out until your breathing and your heartbeat get faster.         Personalized Health Maintenance & Screening Recommendations  Pneumococcal vaccine  Screening mammography Shingrix vaccine   Lung Cancer Screening Recommended: no (Low Dose CT Chest recommended if Age 57-80 years, 30 pack-year currently smoking OR have quit w/in past 15 years) Hepatitis C Screening recommended: no HIV Screening recommended: no  Advanced Directives: Written information was not prepared per patient's request.  Referrals & Orders Orders Placed This Encounter  Procedures   Mammogram 3D [place]    Follow-up Plan Follow-up with Donella Stade, PA-C as planned Schedule your shingrix vaccine at your pharmacy.  Bone density and Mammogram referral has been sent.  Medicare wellness visit in one year.  AVS printed and mailed.   I have personally reviewed and noted the following in the patient's chart:   Medical and social history Use of alcohol, tobacco or illicit drugs  Current medications and supplements Functional ability and status Nutritional status Physical activity Advanced directives List of other physicians Hospitalizations, surgeries, and ER visits in previous 12 months Vitals Screenings to include cognitive, depression, and falls Referrals and  appointments  In addition, I have reviewed and discussed with Boneta A Plancarte certain preventive protocols, quality metrics, and best practice recommendations. A written personalized care plan for preventive services as well as general preventive health recommendations is available and can be mailed to the patient at her request.      Tinnie Gens, RN  03/01/2021

## 2021-03-02 ENCOUNTER — Other Ambulatory Visit: Payer: Self-pay | Admitting: Physician Assistant

## 2021-03-03 ENCOUNTER — Other Ambulatory Visit: Payer: Self-pay

## 2021-03-03 DIAGNOSIS — M5136 Other intervertebral disc degeneration, lumbar region: Secondary | ICD-10-CM | POA: Diagnosis not present

## 2021-03-03 DIAGNOSIS — M5032 Other cervical disc degeneration, mid-cervical region, unspecified level: Secondary | ICD-10-CM | POA: Diagnosis not present

## 2021-03-03 DIAGNOSIS — M461 Sacroiliitis, not elsewhere classified: Secondary | ICD-10-CM | POA: Diagnosis not present

## 2021-03-03 DIAGNOSIS — M47816 Spondylosis without myelopathy or radiculopathy, lumbar region: Secondary | ICD-10-CM | POA: Diagnosis not present

## 2021-03-03 DIAGNOSIS — Z79891 Long term (current) use of opiate analgesic: Secondary | ICD-10-CM | POA: Diagnosis not present

## 2021-03-03 MED ORDER — CYCLOBENZAPRINE HCL 10 MG PO TABS: 10.0000 mg | ORAL_TABLET | Freq: Three times a day (TID) | ORAL | 1 refills | Status: DC

## 2021-03-04 DIAGNOSIS — G8918 Other acute postprocedural pain: Secondary | ICD-10-CM | POA: Diagnosis not present

## 2021-03-04 DIAGNOSIS — S52502A Unspecified fracture of the lower end of left radius, initial encounter for closed fracture: Secondary | ICD-10-CM | POA: Diagnosis not present

## 2021-03-04 DIAGNOSIS — J449 Chronic obstructive pulmonary disease, unspecified: Secondary | ICD-10-CM | POA: Diagnosis not present

## 2021-03-04 DIAGNOSIS — I2510 Atherosclerotic heart disease of native coronary artery without angina pectoris: Secondary | ICD-10-CM | POA: Diagnosis not present

## 2021-03-04 DIAGNOSIS — M1990 Unspecified osteoarthritis, unspecified site: Secondary | ICD-10-CM | POA: Diagnosis not present

## 2021-03-04 DIAGNOSIS — E039 Hypothyroidism, unspecified: Secondary | ICD-10-CM | POA: Diagnosis not present

## 2021-03-04 DIAGNOSIS — Z79899 Other long term (current) drug therapy: Secondary | ICD-10-CM | POA: Diagnosis not present

## 2021-03-04 DIAGNOSIS — R011 Cardiac murmur, unspecified: Secondary | ICD-10-CM | POA: Diagnosis not present

## 2021-03-04 DIAGNOSIS — I10 Essential (primary) hypertension: Secondary | ICD-10-CM | POA: Diagnosis not present

## 2021-03-04 DIAGNOSIS — Z87891 Personal history of nicotine dependence: Secondary | ICD-10-CM | POA: Diagnosis not present

## 2021-03-04 DIAGNOSIS — Z880 Allergy status to penicillin: Secondary | ICD-10-CM | POA: Diagnosis not present

## 2021-03-04 DIAGNOSIS — K219 Gastro-esophageal reflux disease without esophagitis: Secondary | ICD-10-CM | POA: Diagnosis not present

## 2021-03-05 ENCOUNTER — Ambulatory Visit (INDEPENDENT_AMBULATORY_CARE_PROVIDER_SITE_OTHER): Payer: Medicare Other | Admitting: Physician Assistant

## 2021-03-05 ENCOUNTER — Ambulatory Visit (INDEPENDENT_AMBULATORY_CARE_PROVIDER_SITE_OTHER): Payer: Medicare Other

## 2021-03-05 ENCOUNTER — Other Ambulatory Visit: Payer: Self-pay

## 2021-03-05 VITALS — BP 114/59 | HR 61 | Ht 64.0 in | Wt 130.0 lb

## 2021-03-05 DIAGNOSIS — S52609D Unspecified fracture of lower end of unspecified ulna, subsequent encounter for closed fracture with routine healing: Secondary | ICD-10-CM

## 2021-03-05 DIAGNOSIS — E785 Hyperlipidemia, unspecified: Secondary | ICD-10-CM

## 2021-03-05 DIAGNOSIS — Z131 Encounter for screening for diabetes mellitus: Secondary | ICD-10-CM | POA: Diagnosis not present

## 2021-03-05 DIAGNOSIS — R413 Other amnesia: Secondary | ICD-10-CM | POA: Diagnosis not present

## 2021-03-05 DIAGNOSIS — Z780 Asymptomatic menopausal state: Secondary | ICD-10-CM

## 2021-03-05 DIAGNOSIS — F331 Major depressive disorder, recurrent, moderate: Secondary | ICD-10-CM

## 2021-03-05 DIAGNOSIS — M810 Age-related osteoporosis without current pathological fracture: Secondary | ICD-10-CM

## 2021-03-05 DIAGNOSIS — Z79899 Other long term (current) drug therapy: Secondary | ICD-10-CM

## 2021-03-05 DIAGNOSIS — E039 Hypothyroidism, unspecified: Secondary | ICD-10-CM

## 2021-03-05 DIAGNOSIS — S52509D Unspecified fracture of the lower end of unspecified radius, subsequent encounter for closed fracture with routine healing: Secondary | ICD-10-CM

## 2021-03-05 DIAGNOSIS — Z0000 Encounter for general adult medical examination without abnormal findings: Secondary | ICD-10-CM

## 2021-03-05 DIAGNOSIS — F419 Anxiety disorder, unspecified: Secondary | ICD-10-CM

## 2021-03-05 MED ORDER — DONEPEZIL HCL 10 MG PO TABS: 10.0000 mg | ORAL_TABLET | Freq: Every day | ORAL | 3 refills | Status: DC

## 2021-03-05 MED ORDER — VITAMIN D (ERGOCALCIFEROL) 1.25 MG (50000 UNIT) PO CAPS: ORAL_CAPSULE | ORAL | 1 refills | Status: DC

## 2021-03-05 MED ORDER — IBUPROFEN 800 MG PO TABS: 800.0000 mg | ORAL_TABLET | Freq: Four times a day (QID) | ORAL | 2 refills | Status: DC | PRN

## 2021-03-05 MED ORDER — FAMOTIDINE 40 MG PO TABS: 40.0000 mg | ORAL_TABLET | Freq: Every day | ORAL | 3 refills | Status: DC

## 2021-03-05 MED ORDER — HYDROXYZINE HCL 25 MG PO TABS
25.0000 mg | ORAL_TABLET | Freq: Every day | ORAL | 1 refills | Status: DC
Start: 2021-03-05 — End: 2021-08-10

## 2021-03-05 MED ORDER — DULOXETINE HCL 60 MG PO CPEP: ORAL_CAPSULE | ORAL | 1 refills | Status: DC

## 2021-03-05 NOTE — Progress Notes (Signed)
Subjective:    Patient ID: Rachael Douglas, female    DOB: [birth year], 65 y.o.   MRN: [id number]  HPI Pt is a 65 yo female with hypothyroidism, CAD, COPD, Osteoporosis, MDD who presents to the clinic for medication refills.   She recently fell and broke wrist. She had surgical repair done on 03/04/2021. She has osteoporosis but no recent bone density. She did not tolerate fosamax and never went on anything else.   She needs refills of medication. No overall concerns.    .. Active Ambulatory Problems    Diagnosis Date Noted   Hypothyroidism 05/16/2014   Anxiety disorder 05/16/2014   Aortic heart valve narrowing 04/12/2013   Arthropathia 07/10/2008   Leg pain 12/04/2012   DDD (degenerative disc disease), lumbar 05/16/2014   Anxiety and depression 05/16/2014   Gastric catarrh 08/30/2013   Acid reflux 05/16/2014   HLD (hyperlipidemia) 05/16/2014   Arteriosclerosis of coronary artery 08/30/2013   OP (osteoporosis) 05/16/2014   Major depressive disorder, recurrent episode (HCC) 05/16/2014   COPD, moderate (HCC) 09/25/2014   Stricture and stenosis of esophagus 10/23/2014   Aortic valve replaced 10/23/2014   Constipation 12/17/2014   Complication of surgery 11/13/2015   Left foot pain 02/05/2016   Failure to attend appointment 05/05/2016   Diarrhea 08/01/2016   Multiple gastric ulcers 09/05/2016   Collagenous colitis 09/09/2016   Right shoulder pain 11/23/2016   Left shoulder pain 11/23/2016   Trochanteric bursitis of both hips 12/07/2016   Primary insomnia 05/16/2017   Chronic prescription benzodiazepine use 07/04/2017   Left carotid bruit 08/15/2017   Actinic keratoses 12/03/2017   Pruritus 02/21/2018   Ankle ligament laxity, right 05/28/2018   Eczema 05/30/2018   Palpable mass of lower back 05/31/2018   Chronic bilateral low back pain without sciatica 05/31/2018   Easy bruising 09/12/2018   Family history of Alzheimer's disease 07/29/2019   Headache 02/26/2020    Closed fracture of distal end of both radius and ulna with routine healing, subsequent encounter 03/09/2021   Resolved Ambulatory Problems    Diagnosis Date Noted   Rash and nonspecific skin eruption 07/04/2017   Anxiety 05/31/2018   Past Medical History:  Diagnosis Date   Arthritis    Heart disease    Hyperlipidemia    Hypertension    PUD (peptic ulcer disease)    Thyroid disease      Review of Systems See HPI.     Objective:   Physical Exam Vitals reviewed.  Constitutional:      Appearance: Normal appearance.  HENT:     Head: Normocephalic.  Neck:     Vascular: No carotid bruit.  Cardiovascular:     Rate and Rhythm: Normal rate.     Heart sounds: Murmur heard.  Musculoskeletal:     Cervical back: Neck supple. No tenderness.     Right lower leg: No edema.     Left lower leg: No edema.     Comments: Left arm in cast from hand to elbow.   Lymphadenopathy:     Cervical: No cervical adenopathy.  Neurological:     General: No focal deficit present.     Mental Status: She is alert and oriented to person, place, and time.  Psychiatric:        Mood and Affect: Mood normal.        Behavior: Behavior normal.         .. Depression screen [facility] 2/9 03/05/2021 03/01/2021 07/24/2019 04/01/2019 01/08/2019  Decreased Interest 2  0 0 3 3  Down, Depressed, Hopeless 2 2 0 3 2  PHQ - 2 Score 4 2 0 6 5  Altered sleeping 3 0 1 1 3   Tired, decreased energy 2 0 1 3 1   Change in appetite 2 0 0 2 1  Feeling bad or failure about yourself  0 1 0 3 0  Trouble concentrating 2 0 3 3 2   Moving slowly or fidgety/restless 0 0 0 0 0  Suicidal thoughts 0 0 0 0 0  PHQ-9 Score 13 3 5 18 12   Difficult doing work/chores Somewhat difficult Not difficult at all Somewhat difficult Somewhat difficult Somewhat difficult  Some recent data might be hidden   . GAD 7 : Generalized Anxiety Score 03/05/2021 07/24/2019 04/01/2019 01/08/2019  Nervous, Anxious, on Edge 2 1 3 3   Control/stop worrying 2 2 1 3    Worry too much - different things 2 2 2 3   Trouble relaxing 2 1 2 3   Restless 2 1 0 0  Easily annoyed or irritable 2 0 0 0  Afraid - awful might happen 1 0 0 3  Total GAD 7 Score 13 7 8 15   Anxiety Difficulty Somewhat difficult Not difficult at all Not difficult at all Not difficult at all     Assessment & Plan:  6/19/202213/4/2020[name] was seen today for follow-up.  Diagnoses and all orders for this visit:  Acquired hypothyroidism -     TSH  Hyperlipidemia, unspecified hyperlipidemia type -     Lipid Panel w/reflex Direct LDL  Diabetes mellitus screening -     COMPLETE METABOLIC PANEL WITH GFR  Medication management -     CBC with Differential/Platelet -     COMPLETE METABOLIC PANEL WITH GFR -     Lipid Panel w/reflex Direct LDL -     TSH  Memory changes -     donepezil (ARICEPT) 10 MG tablet; Take 1 tablet (10 mg total) by mouth at bedtime.  Moderate episode of recurrent major depressive disorder (HCC) -     DULoxetine (CYMBALTA) 60 MG capsule; TAKE 2 CAPSULES(120 MG) BY MOUTH DAILY  Age-related osteoporosis without current pathological fracture -     Vitamin D, Ergocalciferol, (DRISDOL) 1.25 MG (50000 UNIT) CAPS capsule; TAKE 1 CAPSULE BY MOUTH EVERY 7 DAYS  Anxiety -     hydrOXYzine (ATARAX/VISTARIL) 25 MG tablet; Take 1 tablet (25 mg total) by mouth at bedtime.  Closed fracture of distal end of both radius and ulna with routine healing, subsequent encounter -     ibuprofen (ADVIL) 800 MG tablet; Take 1 tablet (800 mg total) by mouth every 6 (six) hours as needed. -     famotidine (PEPCID) 40 MG tablet; Take 1 tablet (40 mg total) by mouth at bedtime.  Continue to follow up with ortho for fracture management. Bone density ordered for today. Discussed prolia as an option for help with bone growth. Continue vitamin D and calcium. Pt on oxycodone. Ok to take some ibuprofen with food and sparingly. Pt does not smoke anymore.   Fasting labs ordered and will adjust accordingly.    Follow up in 6 months.

## 2021-03-06 LAB — COMPLETE METABOLIC PANEL WITH GFR
AG Ratio: 1.7 (calc) (ref 1.0–2.5)
ALT: 11 U/L (ref 6–29)
AST: 17 U/L (ref 10–35)
Albumin: 4 g/dL (ref 3.6–5.1)
Alkaline phosphatase (APISO): 74 U/L (ref 37–153)
BUN: 13 mg/dL (ref 7–25)
CO2: 33 mmol/L — ABNORMAL HIGH (ref 20–32)
Calcium: 9.3 mg/dL (ref 8.6–10.4)
Chloride: 103 mmol/L (ref 98–110)
Creat: 0.91 mg/dL (ref 0.50–0.99)
GFR, Est African American: 76 mL/min/{1.73_m2} (ref >=60)
GFR, Est Non African American: 66 mL/min/{1.73_m2} (ref >=60)
Globulin: 2.4 g/dL (calc) (ref 1.9–3.7)
Glucose, Bld: 84 mg/dL (ref 65–139)
Potassium: 4.2 mmol/L (ref 3.5–5.3)
Sodium: 141 mmol/L (ref 135–146)
Total Bilirubin: 0.3 mg/dL (ref 0.2–1.2)
Total Protein: 6.4 g/dL (ref 6.1–8.1)

## 2021-03-06 LAB — CBC WITH DIFFERENTIAL/PLATELET
Absolute Monocytes: 668 cells/uL (ref 200–950)
Basophils Absolute: 38 cells/uL (ref 0–200)
Basophils Relative: 0.5 %
Eosinophils Absolute: 173 cells/uL (ref 15–500)
Eosinophils Relative: 2.3 %
HCT: 31.9 % — ABNORMAL LOW (ref 35.0–45.0)
Hemoglobin: 10.2 g/dL — ABNORMAL LOW (ref 11.7–15.5)
Lymphs Abs: 2610 cells/uL (ref 850–3900)
MCH: 26 pg — ABNORMAL LOW (ref 27.0–33.0)
MCHC: 32 g/dL (ref 32.0–36.0)
MCV: 81.2 fL (ref 80.0–100.0)
MPV: 10.4 fL (ref 7.5–12.5)
Monocytes Relative: 8.9 %
Neutro Abs: 4013 cells/uL (ref 1500–7800)
Neutrophils Relative %: 53.5 %
Platelets: 301 10*3/uL (ref 140–400)
RBC: 3.93 10*6/uL (ref 3.80–5.10)
RDW: 14.4 % (ref 11.0–15.0)
Total Lymphocyte: 34.8 %
WBC: 7.5 10*3/uL (ref 3.8–10.8)

## 2021-03-06 LAB — LIPID PANEL W/REFLEX DIRECT LDL
Cholesterol: 133 mg/dL (ref <200)
HDL: 57 mg/dL (ref >=50)
LDL Cholesterol (Calc): 58 mg/dL (calc)
Non-HDL Cholesterol (Calc): 76 mg/dL (calc) (ref <130)
Total CHOL/HDL Ratio: 2.3 (calc) (ref <5.0)
Triglycerides: 95 mg/dL (ref <150)

## 2021-03-06 LAB — TSH: TSH: 2.01 mIU/L (ref 0.40–4.50)

## 2021-03-08 ENCOUNTER — Other Ambulatory Visit: Payer: Self-pay | Admitting: Neurology

## 2021-03-08 DIAGNOSIS — D649 Anemia, unspecified: Secondary | ICD-10-CM

## 2021-03-08 DIAGNOSIS — K219 Gastro-esophageal reflux disease without esophagitis: Secondary | ICD-10-CM

## 2021-03-08 MED ORDER — PANTOPRAZOLE SODIUM 40 MG PO TBEC: 40.0000 mg | DELAYED_RELEASE_TABLET | Freq: Every day | ORAL | 3 refills | Status: DC

## 2021-03-08 NOTE — Progress Notes (Signed)
Please authorize prolia for patient and get her scheduled for injection.

## 2021-03-08 NOTE — Progress Notes (Signed)
Yes start ferrous sulfate OTC 325mg  daily with breakfast.

## 2021-03-08 NOTE — Progress Notes (Signed)
You do have osteoporosis. It has not significantly worsened but would warrant a medication. You have not tolerated 2 of them in the past. Would you like to try prolia a every 6 month shot in office to help build stronger bones.

## 2021-03-08 NOTE — Progress Notes (Signed)
Marisal,   Kidney, liver, glucose look great.  Cholesterol looks fabulous.  Thyroid is perfect.  Your hemoglobin has dropped from 1 year ago.  Some could be the surgery.  Confirm you are not having any black tarry stools or bright red blood in stool?  Are you taking any iron?  We need to follow this closely to make sure not dropping. Recheck in 4 weeks or sooner if having any anemia symptoms worsen.

## 2021-03-09 ENCOUNTER — Encounter: Payer: Self-pay | Admitting: Physician Assistant

## 2021-03-09 DIAGNOSIS — S52509D Unspecified fracture of the lower end of unspecified radius, subsequent encounter for closed fracture with routine healing: Secondary | ICD-10-CM | POA: Insufficient documentation

## 2021-03-09 DIAGNOSIS — S52609D Unspecified fracture of lower end of unspecified ulna, subsequent encounter for closed fracture with routine healing: Secondary | ICD-10-CM | POA: Insufficient documentation

## 2021-03-10 ENCOUNTER — Ambulatory Visit (INDEPENDENT_AMBULATORY_CARE_PROVIDER_SITE_OTHER): Payer: Medicare Other | Admitting: Physician Assistant

## 2021-03-10 ENCOUNTER — Other Ambulatory Visit: Payer: Self-pay

## 2021-03-10 VITALS — BP 116/48 | HR 65 | Resp 20 | Ht 64.0 in | Wt 121.0 lb

## 2021-03-10 DIAGNOSIS — M810 Age-related osteoporosis without current pathological fracture: Secondary | ICD-10-CM

## 2021-03-10 MED ORDER — DENOSUMAB 60 MG/ML ~~LOC~~ SOSY
60.0000 mg | PREFILLED_SYRINGE | Freq: Once | SUBCUTANEOUS | Status: AC
  Administered 2021-03-10: 60 mg via SUBCUTANEOUS

## 2021-03-10 NOTE — Progress Notes (Signed)
Established Patient Office Visit  Subjective:  Patient ID: Rachael Douglas, female    DOB: [birth year]  Age: 65 y.o. MRN: [id number]  CC:  Chief Complaint  Patient presents with   Osteoporosis    HPI [name] presents for a Prolia injection. Pt reports taking calcium and vitamin D daily. Pt's calcium levels and kidney function was within normal limits. Prolia injected into right arm. Pt tolerated well without any apparent complications. Pt will schedule an appointment for next injection in 6 months.   Past Medical History:  Diagnosis Date   Arthritis    Heart disease    Hyperlipidemia    Hypertension    PUD (peptic ulcer disease)    Thyroid disease     Past Surgical History:  Procedure Laterality Date   AORTIC VALVE REPLACEMENT     TOTAL ABDOMINAL HYSTERECTOMY      Family History  Problem Relation Age of Onset   Cancer Other    Depression Other    Diabetes Other    Hypertension Other    Breast cancer Other     Social History   Socioeconomic History   Marital status: Married    Spouse name: [name]   Number of children: 2   Years of education: 12   Highest education level: 12th grade  Occupational History    Comment: Retired  Tobacco Use   Smoking status: Former    Pack years: 0.00    Types: E-cigarettes    Quit date: 05/30/2014    Years since quitting: 6.7   Smokeless tobacco: Never  Vaping Use   Vaping Use: Some days   Substances: Nicotine  Substance and Sexual Activity   Alcohol use: No    Alcohol/week: 0.0 standard drinks   Drug use: No   Sexual activity: Not Currently  Other Topics Concern   Not on file  Social History Narrative   Lives with her husband. She enjoys yard work.    Social Determinants of Health   Financial Resource Strain: Low Risk    Difficulty of Paying Living Expenses: Not hard at all  Food Insecurity: No Food Insecurity   Worried About Programme researcher, broadcasting/film/video in the Last Year: Never true   Ran Out of Food in the Last  Year: Never true  Transportation Needs: No Transportation Needs   Lack of Transportation (Medical): No   Lack of Transportation (Non-Medical): No  Physical Activity: Inactive   Days of Exercise per Week: 0 days   Minutes of Exercise per Session: 0 min  Stress: No Stress Concern Present   Feeling of Stress : Only a little  Social Connections: Moderately Isolated   Frequency of Communication with Friends and Family: More than three times a week   Frequency of Social Gatherings with Friends and Family: Three times a week   Attends Religious Services: Never   Active Member of Clubs or Organizations: No   Attends Banker Meetings: Never   Marital Status: Married  Catering manager Violence: Not At Risk   Fear of Current or Ex-Partner: No   Emotionally Abused: No   Physically Abused: No   Sexually Abused: No    Outpatient Medications Prior to Visit  Medication Sig Dispense Refill   albuterol (VENTOLIN HFA) 108 (90 Base) MCG/ACT inhaler INHALE 2 PUFFS EVERY 6 HOURS AS NEEDED     cyclobenzaprine (FLEXERIL) 10 MG tablet Take 1 tablet (10 mg total) by mouth 3 (three) times daily. 90 tablet 1  donepezil (ARICEPT) 10 MG tablet Take 1 tablet (10 mg total) by mouth at bedtime. 90 tablet 3   DULoxetine (CYMBALTA) 60 MG capsule TAKE 2 CAPSULES(120 MG) BY MOUTH DAILY 180 capsule 1   EASY-LAX PLUS 8.6-50 MG tablet TAKE 2 TABLETS BY MOUTH DAILY FOR 10 DAYS     famotidine (PEPCID) 40 MG tablet Take 1 tablet (40 mg total) by mouth at bedtime. 90 tablet 3   fluticasone (FLONASE) 50 MCG/ACT nasal spray Place 2 sprays into both nostrils daily. 16 g 2   furosemide (LASIX) 40 MG tablet Take 1 tablet (40 mg total) by mouth daily as needed (swelling). 90 tablet 0   gabapentin (NEURONTIN) 600 MG tablet TAKE 2 TABLETS(1200 MG) BY MOUTH THREE TIMES DAILY 540 tablet 1   hydrOXYzine (ATARAX/VISTARIL) 25 MG tablet Take 1 tablet (25 mg total) by mouth at bedtime. 90 tablet 1   ibuprofen (ADVIL) 800 MG  tablet Take 1 tablet (800 mg total) by mouth every 6 (six) hours as needed. 90 tablet 2   levothyroxine (SYNTHROID) 75 MCG tablet TAKE 1 TABLET(75 MCG) BY MOUTH DAILY BEFORE BREAKFAST 90 tablet 0   LINZESS 290 MCG CAPS capsule TAKE 1 CAPSULE(290 MCG) BY MOUTH DAILY 90 capsule 1   metoprolol tartrate (LOPRESSOR) 25 MG tablet Take 1 tablet (25 mg total) by mouth 2 (two) times daily. appt for refills 180 tablet 0   mometasone-formoterol (DULERA) 200-5 MCG/ACT AERO Inhale 1 puff into the lungs daily. 3 Inhaler 3   naloxone (NARCAN) nasal spray 4 mg/0.1 mL USE 1 SPRAY INTRANASALLY EVERY 2-3 MINUTES UNTIL EMERGENCY TEAM HAS ARRIVED. USE IN OPIOD EMERGENCY ONLY AS DIRECTED.     nystatin (MYCOSTATIN) 100000 UNIT/ML suspension Take 5 mLs (500,000 Units total) by mouth 4 (four) times daily. Swish for 30 seconds and spit out. (Patient not taking: Reported on 03/01/2021) 473 mL 0   ondansetron (ZOFRAN ODT) 4 MG disintegrating tablet Take 1 tablet (4 mg total) by mouth every 8 (eight) hours as needed for nausea or vomiting. 12 tablet 1   oxyCODONE (OXY IR/ROXICODONE) 5 MG immediate release tablet Take 5 mg by mouth 4 (four) times daily as needed.     oxyCODONE-acetaminophen (PERCOCET) 10-325 MG tablet Take 1 tablet by mouth every 6 (six) hours as needed.     pantoprazole (PROTONIX) 40 MG tablet Take 1 tablet (40 mg total) by mouth daily. 90 tablet 3   potassium chloride (KLOR-CON) 10 MEQ tablet TAKE 1 TABLET(10 MEQ) BY MOUTH DAILY 90 tablet 3   pravastatin (PRAVACHOL) 80 MG tablet TAKE 1 TABLET(80 MG) BY MOUTH DAILY 90 tablet 3   triamcinolone (KENALOG) 0.1 % paste APPLY A THIN LAYER ON MOUTH ULCERS 2 TO 3 TIMES DAILY AFTER MEALS FOR 5 TO 7 DAYS (Patient not taking: Reported on 03/01/2021) 10 g 1   triamcinolone cream (KENALOG) 0.1 % APPLY TOPICALLY TO THE AFFECTED AREA TWICE DAILY. AVOID FACE (Patient not taking: Reported on 03/01/2021) 80 g 1   Vitamin D, Ergocalciferol, (DRISDOL) 1.25 MG (50000 UNIT) CAPS capsule  TAKE 1 CAPSULE BY MOUTH EVERY 7 DAYS 12 capsule 1   zolpidem (AMBIEN) 10 MG tablet TAKE 1 TABLET(10 MG) BY MOUTH AT BEDTIME AS NEEDED FOR SLEEP 90 tablet 1   No facility-administered medications prior to visit.    Allergies  Allergen Reactions   Penicillins Swelling   Ranitidine Other (See Comments)    Facial swelling   Alendronate Other (See Comments)    unknown   Bisphosphonates  Nausea/stomach pains.     ROS Review of Systems    Objective:    Physical Exam  BP (!) 116/48 (BP Location: Left Arm, Patient Position: Sitting, Cuff Size: Normal)   Pulse 65   Resp 20   Ht 5\' 4"  (1.626 m)   Wt 121 lb (54.9 kg)   SpO2 99%   BMI 20.77 kg/m  Wt Readings from Last 3 Encounters:  03/10/21 121 lb (54.9 kg)  03/05/21 130 lb (59 kg)  08/10/20 121 lb (54.9 kg)     There are no preventive care reminders to display for this patient.  There are no preventive care reminders to display for this patient.  Lab Results  Component Value Date   TSH 2.01 03/05/2021   Lab Results  Component Value Date   WBC 7.5 03/05/2021   HGB 10.2 (L) 03/05/2021   HCT 31.9 (L) 03/05/2021   MCV 81.2 03/05/2021   PLT 301 03/05/2021   Lab Results  Component Value Date   NA 141 03/05/2021   K 4.2 03/05/2021   CO2 33 (H) 03/05/2021   GLUCOSE 84 03/05/2021   BUN 13 03/05/2021   CREATININE 0.91 03/05/2021   BILITOT 0.3 03/05/2021   ALKPHOS 61 12/07/2016   AST 17 03/05/2021   ALT 11 03/05/2021   PROT 6.4 03/05/2021   ALBUMIN 4.1 12/07/2016   CALCIUM 9.3 03/05/2021   Lab Results  Component Value Date   CHOL 133 03/05/2021   Lab Results  Component Value Date   HDL 57 03/05/2021   Lab Results  Component Value Date   LDLCALC 58 03/05/2021   Lab Results  Component Value Date   TRIG 95 03/05/2021   Lab Results  Component Value Date   CHOLHDL 2.3 03/05/2021   No results found for: HGBA1C    Assessment & Plan:  Prolia injected into right arm. Pt tolerated well without any  apparent complications. Pt will schedule an appointment for next injection in 6 months.  Problem List Items Addressed This Visit       Musculoskeletal and Integument   OP (osteoporosis) - Primary (Chronic)    Meds ordered this encounter  Medications   denosumab (PROLIA) injection 60 mg    Order Specific Question:   Patient is enrolled in [facility] program for this medication and I have provided a copy of the Prolia Medication Guide and Patient Brochure.    Answer:   Yes    Order Specific Question:   I have reviewed with the patient the information in the Prolia Medication Guide and Patient Counseling Chart including the serious risks of Prolia and symptoms of each risk.    Answer:   Yes    Order Specific Question:   I have advised the patient to seek medical attention if they have signs or symptoms of any of the serious risks.    Answer:   Yes    Follow-up: Return in about 6 months (around 09/09/2021) for Prolia Injection.    09/11/2021, CMA

## 2021-03-11 NOTE — Progress Notes (Signed)
Agree with above plan. 

## 2021-03-15 DIAGNOSIS — S52502A Unspecified fracture of the lower end of left radius, initial encounter for closed fracture: Secondary | ICD-10-CM | POA: Diagnosis not present

## 2021-03-17 ENCOUNTER — Other Ambulatory Visit: Payer: Self-pay | Admitting: Nurse Practitioner

## 2021-03-17 DIAGNOSIS — E782 Mixed hyperlipidemia: Secondary | ICD-10-CM

## 2021-03-24 ENCOUNTER — Other Ambulatory Visit: Payer: Self-pay | Admitting: Nurse Practitioner

## 2021-03-24 ENCOUNTER — Other Ambulatory Visit: Payer: Self-pay | Admitting: Physician Assistant

## 2021-03-29 ENCOUNTER — Other Ambulatory Visit: Payer: Self-pay | Admitting: Neurology

## 2021-03-29 MED ORDER — POTASSIUM CHLORIDE ER 10 MEQ PO TBCR: EXTENDED_RELEASE_TABLET | ORAL | 3 refills | Status: DC

## 2021-03-30 ENCOUNTER — Other Ambulatory Visit: Payer: Self-pay | Admitting: Neurology

## 2021-03-30 DIAGNOSIS — E782 Mixed hyperlipidemia: Secondary | ICD-10-CM

## 2021-03-30 MED ORDER — PRAVASTATIN SODIUM 80 MG PO TABS: ORAL_TABLET | ORAL | 3 refills | Status: DC

## 2021-03-31 ENCOUNTER — Other Ambulatory Visit: Payer: Self-pay | Admitting: Physician Assistant

## 2021-03-31 DIAGNOSIS — E782 Mixed hyperlipidemia: Secondary | ICD-10-CM

## 2021-03-31 DIAGNOSIS — F5101 Primary insomnia: Secondary | ICD-10-CM

## 2021-03-31 DIAGNOSIS — E039 Hypothyroidism, unspecified: Secondary | ICD-10-CM

## 2021-03-31 NOTE — Telephone Encounter (Signed)
Last written 10/13/2020 #90 with 1 refill Last appt 03/10/2021

## 2021-04-06 DIAGNOSIS — Z79891 Long term (current) use of opiate analgesic: Secondary | ICD-10-CM | POA: Diagnosis not present

## 2021-04-06 DIAGNOSIS — M47816 Spondylosis without myelopathy or radiculopathy, lumbar region: Secondary | ICD-10-CM | POA: Diagnosis not present

## 2021-04-12 DIAGNOSIS — S52502A Unspecified fracture of the lower end of left radius, initial encounter for closed fracture: Secondary | ICD-10-CM | POA: Diagnosis not present

## 2021-04-22 DIAGNOSIS — M25532 Pain in left wrist: Secondary | ICD-10-CM | POA: Diagnosis not present

## 2021-04-22 DIAGNOSIS — S52502D Unspecified fracture of the lower end of left radius, subsequent encounter for closed fracture with routine healing: Secondary | ICD-10-CM | POA: Diagnosis not present

## 2021-04-22 DIAGNOSIS — M6281 Muscle weakness (generalized): Secondary | ICD-10-CM | POA: Diagnosis not present

## 2021-04-22 DIAGNOSIS — R29898 Other symptoms and signs involving the musculoskeletal system: Secondary | ICD-10-CM | POA: Diagnosis not present

## 2021-04-23 ENCOUNTER — Other Ambulatory Visit: Payer: Self-pay

## 2021-04-23 ENCOUNTER — Telehealth (INDEPENDENT_AMBULATORY_CARE_PROVIDER_SITE_OTHER): Payer: Medicare Other | Admitting: Family Medicine

## 2021-04-23 ENCOUNTER — Encounter: Payer: Self-pay | Admitting: Family Medicine

## 2021-04-23 DIAGNOSIS — J0140 Acute pansinusitis, unspecified: Secondary | ICD-10-CM

## 2021-04-23 MED ORDER — FLUTICASONE PROPIONATE 50 MCG/ACT NA SUSP: 2.0000 | Freq: Every day | NASAL | 2 refills | Status: DC

## 2021-04-23 MED ORDER — DOXYCYCLINE HYCLATE 100 MG PO TABS
100.0000 mg | ORAL_TABLET | Freq: Two times a day (BID) | ORAL | 0 refills | Status: AC
Start: 2021-04-23 — End: 2021-04-30

## 2021-04-23 MED ORDER — CYCLOBENZAPRINE HCL 10 MG PO TABS: 10.0000 mg | ORAL_TABLET | Freq: Three times a day (TID) | ORAL | 1 refills | Status: DC

## 2021-04-23 NOTE — Progress Notes (Signed)
Virtual Visit via Telephone Note  I connected with  Rachael Douglas on 04/23/21 at  3:00 PM EDT by telephone and verified that I am speaking with the correct person using two identifiers.   I discussed the limitations, risks, security and privacy concerns of performing an evaluation and management service by telephone and the availability of in person appointments. I also discussed with the patient that there may be a patient responsible charge related to this service. The patient expressed understanding and agreed to proceed.  Participating parties included in this telephone visit include: The patient and the nurse practitioner listed.  The patient is: At home I am: In the office  Subjective:    CC: sinus problems  HPI: Rachael Douglas is a 65 y.o. year old female presenting today via telephone visit to discuss sinusitis.  Patient reports over 2 weeks ago she started having some sinus problems.  She continues to have ongoing 6 out of 10 frontal sinus pressure with headaches, sore throat, nasal congestion, rhinorrhea, occasional coughing, postnasal drip.  She denies any fevers, chest pain, trouble breathing, loss of taste or smell, fatigue, body aches.  She has been taking occasional Benadryl but this has not been helping.  She has not taken a COVID test.    Past medical history, Surgical history, Family history not pertinant except as noted below, Social history, Allergies, and medications have been entered into the medical record, reviewed, and corrections made.   Review of Systems:  All review of systems negative except what is listed in the HPI  Objective:    General:  Patient speaking clearly in complete sentences. No shortness of breath noted.   Alert and oriented x3.   Normal judgment.  No apparent acute distress.  Impression and Recommendations:    1. Acute non-recurrent pansinusitis Given duration of symptoms she is out of the window to test for COVID.  We will go ahead  and start doxycycline and recommend Flonase, rest, hydration, humidifier use, warm liquids with honey and lemon, warm compresses, over-the-counter cough medications and analgesics. If not improving over the next few days, follow-up in person for further assessment. Patient aware of signs/symptoms requiring further/urgent evaluation.  - doxycycline (VIBRA-TABS) 100 MG tablet; Take 1 tablet (100 mg total) by mouth 2 (two) times daily for 7 days.  Dispense: 14 tablet; Refill: 0 - fluticasone (FLONASE) 50 MCG/ACT nasal spray; Place 2 sprays into both nostrils daily.  Dispense: 16 g; Refill: 2    I discussed the assessment and treatment plan with the patient. The patient was provided an opportunity to ask questions and all were answered. The patient agreed with the plan and demonstrated an understanding of the instructions.   The patient was advised to call back or seek an in-person evaluation if the symptoms worsen or if the condition fails to improve as anticipated.  I provided 20 minutes of non-face-to-face time during this TELEPHONE encounter.    Lollie Marrow Reola Calkins, DNP, FNP-C

## 2021-04-23 NOTE — Progress Notes (Signed)
HPI: FU aortic valve replacement. Previously followed at [facility]. Had bioprosthetic aortic valve replacement in 2014. Apparently had no obstructive coronary disease preoperatively. She has had a previous monitor for palpitations that was normal by report. Echocardiogram December 2018 showed normal LV systolic function, bioprosthetic aortic valve with mean gradient 17 mmHg.  Carotid Dopplers December 2018 showed no significant stenosis. Left subclavian flow was disturbed.  Since last seen, patient denies dyspnea, chest pain, palpitations or syncope.  Current Outpatient Medications  Medication Sig Dispense Refill   albuterol (VENTOLIN HFA) 108 (90 Base) MCG/ACT inhaler INHALE 2 PUFFS EVERY 6 HOURS AS NEEDED     cyclobenzaprine (FLEXERIL) 10 MG tablet Take 1 tablet (10 mg total) by mouth 3 (three) times daily. 90 tablet 1   donepezil (ARICEPT) 10 MG tablet Take 1 tablet (10 mg total) by mouth at bedtime. 90 tablet 3   doxycycline (VIBRA-TABS) 100 MG tablet Take 1 tablet (100 mg total) by mouth 2 (two) times daily for 7 days. 14 tablet 0   DULoxetine (CYMBALTA) 60 MG capsule TAKE 2 CAPSULES(120 MG) BY MOUTH DAILY 180 capsule 1   EASY-LAX PLUS 8.6-50 MG tablet TAKE 2 TABLETS BY MOUTH DAILY FOR 10 DAYS     famotidine (PEPCID) 40 MG tablet Take 1 tablet (40 mg total) by mouth at bedtime. 90 tablet 3   fluticasone (FLONASE) 50 MCG/ACT nasal spray Place 2 sprays into both nostrils daily. 16 g 2   furosemide (LASIX) 40 MG tablet Take 1 tablet (40 mg total) by mouth daily as needed (swelling). 90 tablet 0   gabapentin (NEURONTIN) 600 MG tablet TAKE 2 TABLETS(1200 MG) BY MOUTH THREE TIMES DAILY 540 tablet 1   hydrOXYzine (ATARAX/VISTARIL) 25 MG tablet Take 1 tablet (25 mg total) by mouth at bedtime. 90 tablet 1   ibuprofen (ADVIL) 800 MG tablet Take 1 tablet (800 mg total) by mouth every 6 (six) hours as needed. 90 tablet 2   levothyroxine (SYNTHROID) 75 MCG tablet TAKE 1 TABLET(75 MCG) BY MOUTH DAILY  BEFORE BREAKFAST 90 tablet 3   LINZESS 290 MCG CAPS capsule TAKE 1 CAPSULE(290 MCG) BY MOUTH DAILY 90 capsule 1   metoprolol tartrate (LOPRESSOR) 25 MG tablet TAKE 1 TABLET( 25 MG TOTAL) BY MOUTH 2 TIMES DAILY 180 tablet 0   mometasone-formoterol (DULERA) 200-5 MCG/ACT AERO Inhale 1 puff into the lungs daily. 3 Inhaler 3   naloxone (NARCAN) nasal spray 4 mg/0.1 mL USE 1 SPRAY INTRANASALLY EVERY 2-3 MINUTES UNTIL EMERGENCY TEAM HAS ARRIVED. USE IN OPIOD EMERGENCY ONLY AS DIRECTED.     nystatin (MYCOSTATIN) 100000 UNIT/ML suspension Take 5 mLs (500,000 Units total) by mouth 4 (four) times daily. Swish for 30 seconds and spit out. 473 mL 0   ondansetron (ZOFRAN ODT) 4 MG disintegrating tablet Take 1 tablet (4 mg total) by mouth every 8 (eight) hours as needed for nausea or vomiting. 12 tablet 1   oxyCODONE (OXY IR/ROXICODONE) 5 MG immediate release tablet Take 5 mg by mouth 4 (four) times daily as needed.     oxyCODONE-acetaminophen (PERCOCET) 10-325 MG tablet Take 1 tablet by mouth every 6 (six) hours as needed.     pantoprazole (PROTONIX) 40 MG tablet Take 1 tablet (40 mg total) by mouth daily. 90 tablet 3   potassium chloride (KLOR-CON) 10 MEQ tablet TAKE 1 TABLET(10 MEQ) BY MOUTH DAILY 90 tablet 3   pravastatin (PRAVACHOL) 80 MG tablet TAKE 1 TABLET(80 MG) BY MOUTH DAILY 90 tablet 3  triamcinolone (KENALOG) 0.1 % paste APPLY A THIN LAYER ON MOUTH ULCERS 2 TO 3 TIMES DAILY AFTER MEALS FOR 5 TO 7 DAYS 10 g 1   triamcinolone cream (KENALOG) 0.1 % APPLY TOPICALLY TO THE AFFECTED AREA TWICE DAILY. AVOID FACE 80 g 1   Vitamin D, Ergocalciferol, (DRISDOL) 1.25 MG (50000 UNIT) CAPS capsule TAKE 1 CAPSULE BY MOUTH EVERY 7 DAYS 12 capsule 1   zolpidem (AMBIEN) 10 MG tablet TAKE 1 TABLET(10 MG) BY MOUTH AT BEDTIME AS NEEDED FOR SLEEP 90 tablet 1   No current facility-administered medications for this visit.     Past Medical History:  Diagnosis Date   Arthritis    Heart disease    Hyperlipidemia     Hypertension    PUD (peptic ulcer disease)    Thyroid disease     Past Surgical History:  Procedure Laterality Date   AORTIC VALVE REPLACEMENT     TOTAL ABDOMINAL HYSTERECTOMY      Social History   Socioeconomic History   Marital status: Married    Spouse name: [name]   Number of children: 2   Years of education: 12   Highest education level: 12th grade  Occupational History    Comment: Retired  Tobacco Use   Smoking status: Former    Types: E-cigarettes    Quit date: 05/30/2014    Years since quitting: 6.9   Smokeless tobacco: Never  Vaping Use   Vaping Use: Some days   Substances: Nicotine  Substance and Sexual Activity   Alcohol use: No    Alcohol/week: 0.0 standard drinks   Drug use: No   Sexual activity: Not Currently  Other Topics Concern   Not on file  Social History Narrative   Lives with her husband. She enjoys yard work.    Social Determinants of Health   Financial Resource Strain: Low Risk    Difficulty of Paying Living Expenses: Not hard at all  Food Insecurity: No Food Insecurity   Worried About Programme researcher, broadcasting/film/video in the Last Year: Never true   Ran Out of Food in the Last Year: Never true  Transportation Needs: No Transportation Needs   Lack of Transportation (Medical): No   Lack of Transportation (Non-Medical): No  Physical Activity: Inactive   Days of Exercise per Week: 0 days   Minutes of Exercise per Session: 0 min  Stress: No Stress Concern Present   Feeling of Stress : Only a little  Social Connections: Moderately Isolated   Frequency of Communication with Friends and Family: More than three times a week   Frequency of Social Gatherings with Friends and Family: Three times a week   Attends Religious Services: Never   Active Member of Clubs or Organizations: No   Attends Engineer, structural: Never   Marital Status: Married  Catering manager Violence: Not At Risk   Fear of Current or Ex-Partner: No   Emotionally Abused: No    Physically Abused: No   Sexually Abused: No    Family History  Problem Relation Age of Onset   Cancer Other    Depression Other    Diabetes Other    Hypertension Other    Breast cancer Other     ROS: no fevers or chills, productive cough, hemoptysis, dysphasia, odynophagia, melena, hematochezia, dysuria, hematuria, rash, seizure activity, orthopnea, PND, pedal edema, claudication. Remaining systems are negative.  Physical Exam: Well-developed well-nourished in no acute distress.  Skin is warm and dry.  HEENT is normal.  Neck is supple.  Chest is clear to auscultation with normal expansion.  Cardiovascular exam is regular rate and rhythm.  2/6 systolic murmur left sternal border.  No diastolic murmur. Abdominal exam nontender or distended. No masses palpated. Extremities show no edema. neuro grossly intact  ECG-sinus bradycardia at a rate of 58, normal axis, nonspecific ST changes.  Personally reviewed  A/P  1 status post aortic valve replacement-continue SBE prophylaxis.Repeat echocardiogram.  2 hypertension-patient's blood pressure is controlled today.  Continue present medications and follow.  3 hyperlipidemia-continue statin.  [name], MD

## 2021-04-28 ENCOUNTER — Ambulatory Visit (INDEPENDENT_AMBULATORY_CARE_PROVIDER_SITE_OTHER): Payer: Medicare Other | Admitting: Cardiology

## 2021-04-28 ENCOUNTER — Encounter: Payer: Self-pay | Admitting: Cardiology

## 2021-04-28 VITALS — BP 118/69 | HR 58 | Ht 64.0 in | Wt 118.1 lb

## 2021-04-28 DIAGNOSIS — Z952 Presence of prosthetic heart valve: Secondary | ICD-10-CM

## 2021-04-28 DIAGNOSIS — I10 Essential (primary) hypertension: Secondary | ICD-10-CM | POA: Diagnosis not present

## 2021-04-28 DIAGNOSIS — E7800 Pure hypercholesterolemia, unspecified: Secondary | ICD-10-CM

## 2021-04-28 NOTE — Patient Instructions (Signed)

## 2021-05-03 DIAGNOSIS — M47816 Spondylosis without myelopathy or radiculopathy, lumbar region: Secondary | ICD-10-CM | POA: Diagnosis not present

## 2021-05-03 DIAGNOSIS — M5032 Other cervical disc degeneration, mid-cervical region, unspecified level: Secondary | ICD-10-CM | POA: Diagnosis not present

## 2021-05-03 DIAGNOSIS — Z79891 Long term (current) use of opiate analgesic: Secondary | ICD-10-CM | POA: Diagnosis not present

## 2021-05-03 DIAGNOSIS — M5136 Other intervertebral disc degeneration, lumbar region: Secondary | ICD-10-CM | POA: Diagnosis not present

## 2021-05-07 ENCOUNTER — Other Ambulatory Visit: Payer: Self-pay | Admitting: Neurology

## 2021-05-07 DIAGNOSIS — M5416 Radiculopathy, lumbar region: Secondary | ICD-10-CM

## 2021-05-07 MED ORDER — GABAPENTIN 600 MG PO TABS: ORAL_TABLET | ORAL | 0 refills | Status: DC

## 2021-05-13 DIAGNOSIS — R278 Other lack of coordination: Secondary | ICD-10-CM | POA: Diagnosis not present

## 2021-05-13 DIAGNOSIS — S52502D Unspecified fracture of the lower end of left radius, subsequent encounter for closed fracture with routine healing: Secondary | ICD-10-CM | POA: Diagnosis not present

## 2021-05-13 DIAGNOSIS — M25631 Stiffness of right wrist, not elsewhere classified: Secondary | ICD-10-CM | POA: Diagnosis not present

## 2021-05-13 DIAGNOSIS — M6281 Muscle weakness (generalized): Secondary | ICD-10-CM | POA: Diagnosis not present

## 2021-05-13 DIAGNOSIS — M25531 Pain in right wrist: Secondary | ICD-10-CM | POA: Diagnosis not present

## 2021-05-18 ENCOUNTER — Ambulatory Visit (HOSPITAL_COMMUNITY): Payer: Medicare Other | Attending: Cardiovascular Disease

## 2021-05-18 ENCOUNTER — Other Ambulatory Visit: Payer: Self-pay

## 2021-05-18 DIAGNOSIS — Z952 Presence of prosthetic heart valve: Secondary | ICD-10-CM | POA: Insufficient documentation

## 2021-05-18 LAB — ECHOCARDIOGRAM COMPLETE
AV Mean grad: 16.8 mmHg
AV Peak grad: 29.3 mmHg
Ao pk vel: 2.71 m/s
Area-P 1/2: 2.91 cm2
S' Lateral: 2.3 cm

## 2021-05-20 DIAGNOSIS — M25531 Pain in right wrist: Secondary | ICD-10-CM | POA: Diagnosis not present

## 2021-05-20 DIAGNOSIS — S52502D Unspecified fracture of the lower end of left radius, subsequent encounter for closed fracture with routine healing: Secondary | ICD-10-CM | POA: Diagnosis not present

## 2021-05-23 ENCOUNTER — Other Ambulatory Visit: Payer: Self-pay | Admitting: Physician Assistant

## 2021-05-26 DIAGNOSIS — Z4789 Encounter for other orthopedic aftercare: Secondary | ICD-10-CM | POA: Diagnosis not present

## 2021-05-26 DIAGNOSIS — Z8781 Personal history of (healed) traumatic fracture: Secondary | ICD-10-CM | POA: Diagnosis not present

## 2021-05-28 ENCOUNTER — Other Ambulatory Visit: Payer: Self-pay

## 2021-05-28 MED ORDER — FUROSEMIDE 40 MG PO TABS: 40.0000 mg | ORAL_TABLET | Freq: Every day | ORAL | 0 refills | Status: DC | PRN

## 2021-06-04 ENCOUNTER — Other Ambulatory Visit: Payer: Self-pay | Admitting: Physician Assistant

## 2021-06-22 ENCOUNTER — Other Ambulatory Visit: Payer: Self-pay | Admitting: Physician Assistant

## 2021-06-23 ENCOUNTER — Other Ambulatory Visit: Payer: Self-pay | Admitting: Physician Assistant

## 2021-06-23 ENCOUNTER — Other Ambulatory Visit: Payer: Self-pay

## 2021-06-23 DIAGNOSIS — I2510 Atherosclerotic heart disease of native coronary artery without angina pectoris: Secondary | ICD-10-CM

## 2021-06-23 MED ORDER — METOPROLOL TARTRATE 25 MG PO TABS: ORAL_TABLET | ORAL | 0 refills | Status: DC

## 2021-06-30 ENCOUNTER — Other Ambulatory Visit: Payer: Self-pay

## 2021-06-30 ENCOUNTER — Ambulatory Visit (INDEPENDENT_AMBULATORY_CARE_PROVIDER_SITE_OTHER): Payer: Medicare Other

## 2021-06-30 DIAGNOSIS — Z1231 Encounter for screening mammogram for malignant neoplasm of breast: Secondary | ICD-10-CM | POA: Diagnosis not present

## 2021-06-30 DIAGNOSIS — Z0000 Encounter for general adult medical examination without abnormal findings: Secondary | ICD-10-CM

## 2021-07-05 DIAGNOSIS — M5032 Other cervical disc degeneration, mid-cervical region, unspecified level: Secondary | ICD-10-CM | POA: Diagnosis not present

## 2021-07-05 DIAGNOSIS — M5136 Other intervertebral disc degeneration, lumbar region: Secondary | ICD-10-CM | POA: Diagnosis not present

## 2021-07-05 DIAGNOSIS — Z79891 Long term (current) use of opiate analgesic: Secondary | ICD-10-CM | POA: Diagnosis not present

## 2021-07-05 DIAGNOSIS — M47816 Spondylosis without myelopathy or radiculopathy, lumbar region: Secondary | ICD-10-CM | POA: Diagnosis not present

## 2021-07-05 NOTE — Progress Notes (Signed)
Normal mammogram. Follow up in 1 year.

## 2021-07-22 ENCOUNTER — Other Ambulatory Visit: Payer: Self-pay | Admitting: Physician Assistant

## 2021-07-22 DIAGNOSIS — M5416 Radiculopathy, lumbar region: Secondary | ICD-10-CM

## 2021-08-10 ENCOUNTER — Other Ambulatory Visit: Payer: Self-pay

## 2021-08-10 DIAGNOSIS — F419 Anxiety disorder, unspecified: Secondary | ICD-10-CM

## 2021-08-10 MED ORDER — FUROSEMIDE 40 MG PO TABS: 40.0000 mg | ORAL_TABLET | Freq: Every day | ORAL | 0 refills | Status: DC | PRN

## 2021-08-10 MED ORDER — HYDROXYZINE HCL 25 MG PO TABS: 25.0000 mg | ORAL_TABLET | Freq: Every day | ORAL | 1 refills | Status: DC

## 2021-08-21 ENCOUNTER — Other Ambulatory Visit: Payer: Self-pay | Admitting: Physician Assistant

## 2021-08-21 DIAGNOSIS — F331 Major depressive disorder, recurrent, moderate: Secondary | ICD-10-CM

## 2021-08-25 DIAGNOSIS — Z79891 Long term (current) use of opiate analgesic: Secondary | ICD-10-CM | POA: Diagnosis not present

## 2021-08-25 DIAGNOSIS — M791 Myalgia, unspecified site: Secondary | ICD-10-CM | POA: Diagnosis not present

## 2021-08-25 DIAGNOSIS — M5136 Other intervertebral disc degeneration, lumbar region: Secondary | ICD-10-CM | POA: Diagnosis not present

## 2021-08-25 DIAGNOSIS — M5032 Other cervical disc degeneration, mid-cervical region, unspecified level: Secondary | ICD-10-CM | POA: Diagnosis not present

## 2021-08-25 DIAGNOSIS — M47816 Spondylosis without myelopathy or radiculopathy, lumbar region: Secondary | ICD-10-CM | POA: Diagnosis not present

## 2021-09-01 ENCOUNTER — Other Ambulatory Visit: Payer: Self-pay

## 2021-09-01 MED ORDER — TRIAMCINOLONE ACETONIDE 0.1 % MT PSTE
PASTE | OROMUCOSAL | 1 refills | Status: DC
Start: 2021-09-01 — End: 2022-03-08

## 2021-09-07 ENCOUNTER — Ambulatory Visit (INDEPENDENT_AMBULATORY_CARE_PROVIDER_SITE_OTHER): Payer: Medicare Other | Admitting: Physician Assistant

## 2021-09-07 DIAGNOSIS — Z91199 Patient's noncompliance with other medical treatment and regimen due to unspecified reason: Secondary | ICD-10-CM

## 2021-09-07 NOTE — Progress Notes (Signed)
No show

## 2021-09-16 ENCOUNTER — Telehealth: Payer: Self-pay

## 2021-09-16 NOTE — Telephone Encounter (Signed)
Rachael Douglas called and left a message requesting the Hydroxyzine to be increased to 4 times daily. Her daughters have moved in with her and she has more anxiety. Please advise.

## 2021-09-20 ENCOUNTER — Other Ambulatory Visit: Payer: Self-pay | Admitting: Physician Assistant

## 2021-09-20 DIAGNOSIS — F331 Major depressive disorder, recurrent, moderate: Secondary | ICD-10-CM

## 2021-09-21 NOTE — Telephone Encounter (Signed)
Too much anti-histamine can worsen constipation, dry mouth, cognition. I would rather increase a daily medication other than an as needed medication. I have not seen you in a while schedule an appt to discuss. Marylene Land please make 40 minutes.

## 2021-09-22 NOTE — Telephone Encounter (Signed)
Patient advised and scheduled.  

## 2021-09-24 ENCOUNTER — Ambulatory Visit (INDEPENDENT_AMBULATORY_CARE_PROVIDER_SITE_OTHER): Payer: 59 | Admitting: Physician Assistant

## 2021-09-24 ENCOUNTER — Other Ambulatory Visit: Payer: Self-pay

## 2021-09-24 VITALS — BP 143/64 | HR 63 | Temp 97.6°F | Ht 64.0 in | Wt 124.0 lb

## 2021-09-24 DIAGNOSIS — F331 Major depressive disorder, recurrent, moderate: Secondary | ICD-10-CM

## 2021-09-24 DIAGNOSIS — Z23 Encounter for immunization: Secondary | ICD-10-CM

## 2021-09-24 DIAGNOSIS — J0140 Acute pansinusitis, unspecified: Secondary | ICD-10-CM | POA: Diagnosis not present

## 2021-09-24 MED ORDER — DULOXETINE HCL 60 MG PO CPEP: 120.0000 mg | ORAL_CAPSULE | Freq: Every day | ORAL | 2 refills | Status: DC

## 2021-09-24 MED ORDER — AZITHROMYCIN 250 MG PO TABS: ORAL_TABLET | ORAL | 0 refills | Status: DC

## 2021-09-24 MED ORDER — ARIPIPRAZOLE 5 MG PO TABS: 5.0000 mg | ORAL_TABLET | Freq: Every day | ORAL | 2 refills | Status: DC

## 2021-09-24 MED ORDER — BUSPIRONE HCL 5 MG PO TABS: 5.0000 mg | ORAL_TABLET | Freq: Two times a day (BID) | ORAL | 2 refills | Status: DC

## 2021-09-24 NOTE — Patient Instructions (Addendum)
Added abilify 5mg  daily and buspar twice a day for mood.

## 2021-09-24 NOTE — Progress Notes (Signed)
Subjective:    Patient ID: Rachael Douglas, female    DOB: [birth year], 66 y.o.   MRN: [id number]  HPI Pt is a 66 yo female with COPD, CAD, hypothyroidism, chronic pain, MDD, GAD who presents to the clinic to discuss worsening anxiety.   Her daughter has moved in with her and it has really tested her nerves. She wants to know what else she can do. She is taking vistaril at least twice a day with buspar, cymbalta.   She is having URI symptoms for at least 2 weeks. Lots of sinus pressure and drainage. Blowing out yellow phelym. No fever, chills, body aches.   .. Active Ambulatory Problems    Diagnosis Date Noted   Hypothyroidism 05/16/2014   Anxiety disorder 05/16/2014   Aortic heart valve narrowing 04/12/2013   Arthropathia 07/10/2008   Leg pain 12/04/2012   DDD (degenerative disc disease), lumbar 05/16/2014   Anxiety and depression 05/16/2014   Gastric catarrh 08/30/2013   Acid reflux 05/16/2014   HLD (hyperlipidemia) 05/16/2014   Arteriosclerosis of coronary artery 08/30/2013   OP (osteoporosis) 05/16/2014   Major depressive disorder, recurrent episode ([place]) 05/16/2014   COPD, moderate ([place]) 09/25/2014   Stricture and stenosis of esophagus 10/23/2014   Aortic valve replaced 10/23/2014   Constipation [id number]   Complication of surgery [id number]   Left foot pain 02/05/2016   Failure to attend appointment 05/05/2016   Diarrhea 08/01/2016   Multiple gastric ulcers 09/05/2016   Collagenous colitis 09/09/2016   Right shoulder pain 11/23/2016   Left shoulder pain 11/23/2016   Trochanteric bursitis of both hips 12/07/2016   Primary insomnia 05/16/2017   Chronic prescription benzodiazepine use 07/04/2017   Left carotid bruit 08/15/2017   Actinic keratoses 12/03/2017   Pruritus 02/21/2018   Ankle ligament laxity, right 05/28/2018   Eczema 05/30/2018   Palpable mass of lower back 05/31/2018   Chronic bilateral low back pain without sciatica 05/31/2018   Easy bruising  09/12/2018   Family history of Alzheimer's disease 07/29/2019   Headache 02/26/2020   Closed fracture of distal end of both radius and ulna with routine healing, subsequent encounter 03/09/2021   Resolved Ambulatory Problems    Diagnosis Date Noted   Rash and nonspecific skin eruption 07/04/2017   Anxiety 05/31/2018   Past Medical History:  Diagnosis Date   Arthritis    Heart disease    Hyperlipidemia    Hypertension    PUD (peptic ulcer disease)    Thyroid disease      Review of Systems See HPI.     Objective:   Physical Exam Vitals reviewed.  Constitutional:      Appearance: Normal appearance.  HENT:     Head: Normocephalic.     Comments: Tenderness over the maxillary sinuses.     Right Ear: Tympanic membrane, ear canal and external ear normal. There is no impacted cerumen.     Left Ear: Tympanic membrane, ear canal and external ear normal. There is no impacted cerumen.     Nose: Congestion present.     Mouth/Throat:     Mouth: Mucous membranes are moist.     Pharynx: Posterior oropharyngeal erythema present.  Eyes:     Conjunctiva/sclera: Conjunctivae normal.  Cardiovascular:     Rate and Rhythm: Normal rate and regular rhythm.     Pulses: Normal pulses.     Heart sounds: Murmur heard.  Pulmonary:     Effort: Pulmonary effort is normal.     Breath sounds: Normal  breath sounds.  Musculoskeletal:     Right lower leg: No edema.     Left lower leg: No edema.  Neurological:     General: No focal deficit present.     Mental Status: She is alert and oriented to person, place, and time.  Psychiatric:        Mood and Affect: Mood normal.      .. Depression screen [facility] 2/9 09/24/2021 03/05/2021 03/01/2021 07/24/2019 04/01/2019  Decreased Interest 0 2 0 0 3  Down, Depressed, Hopeless 0 2 2 0 3  PHQ - 2 Score 0 4 2 0 6  Altered sleeping 1 3 0 1 1  Tired, decreased energy 2 2 0 1 3  Change in appetite 0 2 0 0 2  Feeling bad or failure about yourself  3 0 1 0 3   Trouble concentrating 3 2 0 3 3  Moving slowly or fidgety/restless 0 0 0 0 0  Suicidal thoughts 0 0 0 0 0  PHQ-9 Score 9 13 3 5 18   Difficult doing work/chores Somewhat difficult Somewhat difficult Not difficult at all Somewhat difficult Somewhat difficult  Some recent data might be hidden   .[name] GAD 7 : Generalized Anxiety Score 09/24/2021 03/05/2021 07/24/2019 04/01/2019  Nervous, Anxious, on Edge 3 2 1 3   Control/stop worrying 3 2 2 1   Worry too much - different things 3 2 2 2   Trouble relaxing 3 2 1 2   Restless 3 2 1  0  Easily annoyed or irritable 1 2 0 0  Afraid - awful might happen 1 1 0 0  Total GAD 7 Score 17 13 7 8   Anxiety Difficulty Somewhat difficult Somewhat difficult Not difficult at all Not difficult at all        Assessment & Plan:  Marland KitchenMarland KitchenViolet was seen today for anxiety.  Diagnoses and all orders for this visit:  Acute non-recurrent pansinusitis -     azithromycin (ZITHROMAX Z-PAK) 250 MG tablet; Take 2 tablets (500 mg) on  Day 1,  followed by 1 tablet (250 mg) once daily on Days 2 through 5.  Moderate episode of recurrent major depressive disorder (HCC) -     DULoxetine (CYMBALTA) 60 MG capsule; Take 2 capsules (120 mg total) by mouth daily. -     ARIPiprazole (ABILIFY) 5 MG tablet; Take 1 tablet (5 mg total) by mouth daily. -     busPIRone (BUSPAR) 5 MG tablet; Take 1 tablet (5 mg total) by mouth 2 (two) times daily.  Needs flu shot -     Flu Vaccine QUAD High Dose(Fluad)  Need for pneumococcal vaccine -     Pneumococcal conjugate vaccine 20-valent (Prevnar 20)  Sent zpak, encouraged to use flonase Discussed symptomatic care Follow up as needed.   For anxiety added  Continue cymbalta Added buspar and abilify Continue as needed hydroxyzine.  Follow up in 4 weeks.

## 2021-09-28 ENCOUNTER — Encounter: Payer: Self-pay | Admitting: Physician Assistant

## 2021-10-05 ENCOUNTER — Other Ambulatory Visit: Payer: Self-pay

## 2021-10-05 ENCOUNTER — Encounter: Payer: Self-pay | Admitting: Physician Assistant

## 2021-10-05 ENCOUNTER — Ambulatory Visit (INDEPENDENT_AMBULATORY_CARE_PROVIDER_SITE_OTHER): Payer: 59 | Admitting: Physician Assistant

## 2021-10-05 VITALS — BP 129/56 | HR 107 | Ht 64.0 in | Wt 128.1 lb

## 2021-10-05 DIAGNOSIS — N760 Acute vaginitis: Secondary | ICD-10-CM

## 2021-10-05 DIAGNOSIS — R300 Dysuria: Secondary | ICD-10-CM

## 2021-10-05 DIAGNOSIS — R339 Retention of urine, unspecified: Secondary | ICD-10-CM | POA: Diagnosis not present

## 2021-10-05 DIAGNOSIS — R052 Subacute cough: Secondary | ICD-10-CM

## 2021-10-05 DIAGNOSIS — N3000 Acute cystitis without hematuria: Secondary | ICD-10-CM

## 2021-10-05 DIAGNOSIS — R350 Frequency of micturition: Secondary | ICD-10-CM | POA: Diagnosis not present

## 2021-10-05 LAB — POCT URINALYSIS DIP (CLINITEK)
Blood, UA: NEGATIVE
Glucose, UA: NEGATIVE mg/dL
Nitrite, UA: NEGATIVE
POC PROTEIN,UA: 30 — AB
Spec Grav, UA: >=1.03 — AB (ref 1.010–1.025)
Urobilinogen, UA: 0.2 E.U./dL
pH, UA: 5.5 (ref 5.0–8.0)

## 2021-10-05 MED ORDER — NITROFURANTOIN MONOHYD MACRO 100 MG PO CAPS: 100.0000 mg | ORAL_CAPSULE | Freq: Two times a day (BID) | ORAL | 0 refills | Status: DC

## 2021-10-05 MED ORDER — METHYLPREDNISOLONE 4 MG PO TBPK: ORAL_TABLET | ORAL | 0 refills | Status: DC

## 2021-10-05 MED ORDER — FLUCONAZOLE 150 MG PO TABS: 150.0000 mg | ORAL_TABLET | Freq: Once | ORAL | 0 refills | Status: AC

## 2021-10-05 MED ORDER — AMBULATORY NON FORMULARY MEDICATION: 0 refills | Status: AC

## 2021-10-05 NOTE — Progress Notes (Addendum)
Acute Office Visit  Subjective:    Patient ID: Rachael Douglas, female    DOB: Apr 13, 1956, 66 y.o.   MRN: [id number]  Chief Complaint  Patient presents with   Dysuria    HPI Pt is a 66 yo female with hx of urinary retention and in and out caths most nights. She has not been lately because she is out of supplies. She needs supplies. Over the past 3 nights she has had more frequent urination, dysuria, and lower abdominal pressure. No flank pain, fever, chills, nausea, vomiting. She notice the urine looks darker. Not tried anything to make better. She has some vaginal itching as well but just finished zpak.   She continues to have a dry cough after recent infection. No SOB.   Past Medical History:  Diagnosis Date   Arthritis    Heart disease    Hyperlipidemia    Hypertension    PUD (peptic ulcer disease)    Thyroid disease     Past Surgical History:  Procedure Laterality Date   AORTIC VALVE REPLACEMENT     TOTAL ABDOMINAL HYSTERECTOMY      Family History  Problem Relation Age of Onset   Cancer Other    Depression Other    Diabetes Other    Hypertension Other    Breast cancer Other     Social History   Socioeconomic History   Marital status: Married    Spouse name: Fritz Pickerel   Number of children: 2   Years of education: 12   Highest education level: 12th grade  Occupational History    Comment: Retired  Tobacco Use   Smoking status: Former    Types: E-cigarettes    Quit date: 05/30/2014    Years since quitting: 7.3   Smokeless tobacco: Never  Vaping Use   Vaping Use: Some days   Substances: Nicotine  Substance and Sexual Activity   Alcohol use: No    Alcohol/week: 0.0 standard drinks   Drug use: No   Sexual activity: Not Currently  Other Topics Concern   Not on file  Social History Narrative   Lives with her husband. She enjoys yard work.    Social Determinants of Health   Financial Resource Strain: Low Risk    Difficulty of Paying Living Expenses: Not  hard at all  Food Insecurity: No Food Insecurity   Worried About Charity fundraiser in the Last Year: Never true   [place] in the Last Year: Never true  Transportation Needs: No Transportation Needs   Lack of Transportation (Medical): No   Lack of Transportation (Non-Medical): No  Physical Activity: Inactive   Days of Exercise per Week: 0 days   Minutes of Exercise per Session: 0 min  Stress: No Stress Concern Present   Feeling of Stress : Only a little  Social Connections: Moderately Isolated   Frequency of Communication with Friends and Family: More than three times a week   Frequency of Social Gatherings with Friends and Family: Three times a week   Attends Religious Services: Never   Active Member of Clubs or Organizations: No   Attends Archivist Meetings: Never   Marital Status: Married  Human resources officer Violence: Not At Risk   Fear of Current or Ex-Partner: No   Emotionally Abused: No   Physically Abused: No   Sexually Abused: No    Outpatient Medications Prior to Visit  Medication Sig Dispense Refill   albuterol (VENTOLIN HFA) 108 (90  Base) MCG/ACT inhaler INHALE 2 PUFFS EVERY 6 HOURS AS NEEDED     ARIPiprazole (ABILIFY) 5 MG tablet Take 1 tablet (5 mg total) by mouth daily. 30 tablet 2   busPIRone (BUSPAR) 5 MG tablet Take 1 tablet (5 mg total) by mouth 2 (two) times daily. 60 tablet 2   cyclobenzaprine (FLEXERIL) 10 MG tablet Take 1 tablet (10 mg total) by mouth 3 (three) times daily. 90 tablet 1   donepezil (ARICEPT) 10 MG tablet Take 1 tablet (10 mg total) by mouth at bedtime. 90 tablet 3   DULoxetine (CYMBALTA) 60 MG capsule Take 2 capsules (120 mg total) by mouth daily. 60 capsule 2   EASY-LAX PLUS 8.6-50 MG tablet TAKE 2 TABLETS BY MOUTH DAILY FOR 10 DAYS     famotidine (PEPCID) 40 MG tablet Take 1 tablet (40 mg total) by mouth at bedtime. 90 tablet 3   fluticasone (FLONASE) 50 MCG/ACT nasal spray Place 2 sprays into both nostrils daily. 16 g 2    furosemide (LASIX) 40 MG tablet Take 1 tablet (40 mg total) by mouth daily as needed (swelling). 90 tablet 0   gabapentin (NEURONTIN) 600 MG tablet TAKE 2 TABLETS(1200 MG) BY MOUTH THREE TIMES DAILY 540 tablet 0   hydrOXYzine (ATARAX/VISTARIL) 25 MG tablet Take 1 tablet (25 mg total) by mouth at bedtime. 90 tablet 1   ibuprofen (ADVIL) 800 MG tablet Take 1 tablet (800 mg total) by mouth every 6 (six) hours as needed. 90 tablet 2   levothyroxine (SYNTHROID) 75 MCG tablet TAKE 1 TABLET(75 MCG) BY MOUTH DAILY BEFORE BREAKFAST 90 tablet 3   LINZESS 290 MCG CAPS capsule TAKE 1 CAPSULE(290 MCG) BY MOUTH DAILY 90 capsule 1   metoprolol tartrate (LOPRESSOR) 25 MG tablet TAKE 1 TABLET(25 MG) BY MOUTH TWICE DAILY 180 tablet 0   mometasone-formoterol (DULERA) 200-5 MCG/ACT AERO Inhale 1 puff into the lungs daily. 3 Inhaler 3   naloxone (NARCAN) nasal spray 4 mg/0.1 mL USE 1 SPRAY INTRANASALLY EVERY 2-3 MINUTES UNTIL EMERGENCY TEAM HAS ARRIVED. USE IN OPIOD EMERGENCY ONLY AS DIRECTED.     nystatin (MYCOSTATIN) 100000 UNIT/ML suspension Take 5 mLs (500,000 Units total) by mouth 4 (four) times daily. Swish for 30 seconds and spit out. 473 mL 0   ondansetron (ZOFRAN ODT) 4 MG disintegrating tablet Take 1 tablet (4 mg total) by mouth every 8 (eight) hours as needed for nausea or vomiting. 12 tablet 1   oxyCODONE (OXY IR/ROXICODONE) 5 MG immediate release tablet Take 5 mg by mouth 4 (four) times daily as needed.     oxyCODONE-acetaminophen (PERCOCET) 10-325 MG tablet Take 1 tablet by mouth every 6 (six) hours as needed.     pantoprazole (PROTONIX) 40 MG tablet Take 1 tablet (40 mg total) by mouth daily. 90 tablet 3   potassium chloride (KLOR-CON) 10 MEQ tablet TAKE 1 TABLET(10 MEQ) BY MOUTH DAILY 90 tablet 3   pravastatin (PRAVACHOL) 80 MG tablet TAKE 1 TABLET(80 MG) BY MOUTH DAILY 90 tablet 3   triamcinolone (KENALOG) 0.1 % paste APPLY A THIN LAYER ON MOUTH ULCERS 2 TO 3 TIMES DAILY AFTER MEALS FOR 5 TO 7 DAYS 10 g  1   triamcinolone cream (KENALOG) 0.1 % APPLY TOPICALLY TO THE AFFECTED AREA TWICE DAILY. AVOID FACE 80 g 1   Vitamin D, Ergocalciferol, (DRISDOL) 1.25 MG (50000 UNIT) CAPS capsule TAKE 1 CAPSULE BY MOUTH EVERY 7 DAYS 12 capsule 1   zolpidem (AMBIEN) 10 MG tablet TAKE 1 TABLET(10 MG) BY  MOUTH AT BEDTIME AS NEEDED FOR SLEEP 90 tablet 1   azithromycin (ZITHROMAX Z-PAK) 250 MG tablet Take 2 tablets (500 mg) on  Day 1,  followed by 1 tablet (250 mg) once daily on Days 2 through 5. 6 tablet 0   No facility-administered medications prior to visit.    Allergies  Allergen Reactions   Penicillins Swelling   Ranitidine Other (See Comments)    Facial swelling   Alendronate Other (See Comments)    unknown   Bisphosphonates     Nausea/stomach pains.     Review of Systems    See HPI.  Objective:    Physical Exam Vitals reviewed.  Constitutional:      Appearance: Normal appearance.  HENT:     Head: Normocephalic.     Mouth/Throat:     Mouth: Mucous membranes are moist.  Neck:     Vascular: No carotid bruit.  Cardiovascular:     Rate and Rhythm: Normal rate and regular rhythm.     Heart sounds: Murmur heard.  Pulmonary:     Effort: Pulmonary effort is normal.     Breath sounds: Normal breath sounds.  Abdominal:     General: Bowel sounds are normal. There is no distension.     Palpations: Abdomen is soft. There is no mass.     Tenderness: There is no abdominal tenderness. There is no right CVA tenderness, left CVA tenderness, guarding or rebound.     Hernia: No hernia is present.  Musculoskeletal:     Right lower leg: No edema.     Left lower leg: No edema.  Lymphadenopathy:     Cervical: No cervical adenopathy.  Neurological:     Mental Status: She is alert.  Psychiatric:        Mood and Affect: Mood normal.    BP (!) 129/56    Pulse (!) 107    Ht 5\' 4"  (1.626 m)    Wt 128 lb 1.9 oz (58.1 kg)    SpO2 94%    BMI 21.99 kg/m  Wt Readings from Last 3 Encounters:  10/05/21 128  lb 1.9 oz (58.1 kg)  09/24/21 124 lb (56.2 kg)  04/28/21 118 lb 1.9 oz (53.6 kg)    Health Maintenance Due  Topic Date Due   Zoster Vaccines- Shingrix (1 of 2) Never done  .[name] Results for orders placed or performed in visit on 10/05/21  Urine Culture   Specimen: Urine  Result Value Ref Range   MICRO NUMBER: [id number]    SPECIMEN QUALITY: Adequate    Sample Source NOT GIVEN    STATUS: FINAL    ISOLATE 1:      Mixed genital flora isolated. These superficial bacteria are not indicative of a urinary tract infection. No further organism identification is warranted on this specimen. If clinically indicated, recollect clean-catch, mid-stream urine and transfer  immediately to Urine Culture Transport Tube.   POCT URINALYSIS DIP (CLINITEK)  Result Value Ref Range   Color, UA straw (A) yellow   Clarity, UA cloudy (A) clear   Glucose, UA negative negative mg/dL   Bilirubin, UA small (A) negative   Ketones, POC UA small (15) (A) negative mg/dL   Spec Grav, UA >=1.030 (A) 1.010 - 1.025   Blood, UA negative negative   pH, UA 5.5 5.0 - 8.0   POC PROTEIN,UA =30 (A) negative, trace   Urobilinogen, UA 0.2 0.2 or 1.0 E.U./dL   Nitrite, UA Negative Negative   Leukocytes, UA Trace (  A) Negative        Assessment & Plan:  Marland KitchenMarland KitchenViolet was seen today for dysuria.  Diagnoses and all orders for this visit:  Acute cystitis without hematuria -     nitrofurantoin, macrocrystal-monohydrate, (MACROBID) 100 MG capsule; Take 1 capsule (100 mg total) by mouth 2 (two) times daily.  Dysuria -     nitrofurantoin, macrocrystal-monohydrate, (MACROBID) 100 MG capsule; Take 1 capsule (100 mg total) by mouth 2 (two) times daily. -     POCT URINALYSIS DIP (CLINITEK) -     Urine Culture  Urinary retention -     nitrofurantoin, macrocrystal-monohydrate, (MACROBID) 100 MG capsule; Take 1 capsule (100 mg total) by mouth 2 (two) times daily. -     AMBULATORY NON FORMULARY MEDICATION; In and out catheter and supplies  to self catherize every night.  Urinary frequency  Subacute cough -     methylPREDNISolone (MEDROL DOSEPAK) 4 MG TBPK tablet; Take as directed by package insert.  Acute vaginitis -     fluconazole (DIFLUCAN) 150 MG tablet; Take 1 tablet (150 mg total) by mouth once for 1 dose. Take one after complete antibiotic.   Ua positive for leuks and bilirubin. Will culture Will treat empirically with macrobid Increase hydration In and out caths prescribed due to indefinite urinary retention it is medically necessary for her to perform CIC with 14FR straight tip catheter 1-2 times daily. She will also require non sterile gloves, 100/month of disposable underpads/chux and 100/month of adult medium pull-ons to manager her continence care needs.   Due to high fall risk and balance issues is is medically needed for patient to have a rollater to decrease her fall risk.  Diflucan for yeast after abx.   Medrol dose pack for post viral cough     Iran Planas, PA-C

## 2021-10-05 NOTE — Patient Instructions (Signed)

## 2021-10-06 ENCOUNTER — Telehealth: Payer: Self-pay | Admitting: Neurology

## 2021-10-06 NOTE — Telephone Encounter (Signed)
Catheter supplies order faxed to [employer] at [phone] with confirmation received.

## 2021-10-07 LAB — URINE CULTURE
MICRO NUMBER:: 12884252
SPECIMEN QUALITY:: ADEQUATE

## 2021-10-08 ENCOUNTER — Telehealth: Payer: Self-pay | Admitting: Physician Assistant

## 2021-10-08 NOTE — Telephone Encounter (Signed)
Rachael Douglas [phone]) calling about a referral he received for Rachael Douglas to start in and out cath nightly. They need to get the order signed and need updated notes speaking to the need. He will explain further once someone calls him back.

## 2021-10-08 NOTE — Progress Notes (Signed)
No abnormal bacteria growth on urine culture. You can stop antibiotic. How are you feeling?

## 2021-10-12 ENCOUNTER — Telehealth: Payer: Self-pay | Admitting: Physician Assistant

## 2021-10-12 MED ORDER — BENZONATATE 200 MG PO CAPS: 200.0000 mg | ORAL_CAPSULE | Freq: Three times a day (TID) | ORAL | 1 refills | Status: DC | PRN

## 2021-10-12 NOTE — Telephone Encounter (Signed)
Patient called and left a Voicemail requesting Tandy Gaw to call her in [place] for her cough that she has. Please advise

## 2021-10-14 ENCOUNTER — Other Ambulatory Visit: Payer: Self-pay | Admitting: Physician Assistant

## 2021-10-14 ENCOUNTER — Other Ambulatory Visit: Payer: Self-pay

## 2021-10-14 DIAGNOSIS — F5101 Primary insomnia: Secondary | ICD-10-CM

## 2021-10-14 DIAGNOSIS — S52609D Unspecified fracture of lower end of unspecified ulna, subsequent encounter for closed fracture with routine healing: Secondary | ICD-10-CM

## 2021-10-15 ENCOUNTER — Telehealth: Payer: Self-pay

## 2021-10-15 MED ORDER — ZOLPIDEM TARTRATE 10 MG PO TABS: 10.0000 mg | ORAL_TABLET | Freq: Every evening | ORAL | 1 refills | Status: DC | PRN

## 2021-10-15 NOTE — Telephone Encounter (Signed)
Patient also states an Horticulturist, commercial saw her and instructed her not to take benadryl due to memory. She is going to try Zyrtec and let us know if it helps nasal drainage. She may want to discuss a different memory medication.

## 2021-10-15 NOTE — Telephone Encounter (Signed)
If she cannot void she needs to be cath herself. 1/17 there was no infection. Does she have fever, chill, n/v. I would go to urgent care to make sure she does not have infection now and check her urine.

## 2021-10-15 NOTE — Telephone Encounter (Signed)
Rachael Douglas states she feels like she has to go urinate urgently. However, when she goes to the bathroom she is unable to void. She reports urinating on herself when she coughs or sneezes.   Her daughter is concerned about her memory.   Please advise.

## 2021-10-15 NOTE — Telephone Encounter (Signed)
Patient made aware of recommendations.   She states no nausea, vomiting, chills or fever. If gets worse over the weekend she will be seen at urgent care. She does have catheter.

## 2021-10-19 ENCOUNTER — Telehealth: Payer: Self-pay

## 2021-10-19 DIAGNOSIS — M5416 Radiculopathy, lumbar region: Secondary | ICD-10-CM

## 2021-10-19 DIAGNOSIS — Z9181 History of falling: Secondary | ICD-10-CM

## 2021-10-19 MED ORDER — AMBULATORY NON FORMULARY MEDICATION: 0 refills | Status: DC

## 2021-10-19 NOTE — Telephone Encounter (Signed)
Rachael Douglas would like a rollator with seat. Faxed order to [phone]

## 2021-10-20 ENCOUNTER — Other Ambulatory Visit: Payer: Self-pay | Admitting: Physician Assistant

## 2021-10-20 DIAGNOSIS — M5416 Radiculopathy, lumbar region: Secondary | ICD-10-CM

## 2021-10-21 ENCOUNTER — Telehealth: Payer: Self-pay | Admitting: Neurology

## 2021-10-21 NOTE — Telephone Encounter (Signed)
Received vm from [employer] asking for last notes on patient for rolling walker RX. Notes faxed to [phone] with confirmation received.

## 2021-11-01 ENCOUNTER — Other Ambulatory Visit: Payer: Self-pay

## 2021-11-01 ENCOUNTER — Other Ambulatory Visit: Payer: Self-pay | Admitting: Physician Assistant

## 2021-11-01 DIAGNOSIS — F331 Major depressive disorder, recurrent, moderate: Secondary | ICD-10-CM

## 2021-11-01 MED ORDER — DULOXETINE HCL 60 MG PO CPEP: 120.0000 mg | ORAL_CAPSULE | Freq: Every day | ORAL | 2 refills | Status: DC

## 2021-11-19 ENCOUNTER — Other Ambulatory Visit: Payer: Self-pay | Admitting: Physician Assistant

## 2021-11-19 DIAGNOSIS — F419 Anxiety disorder, unspecified: Secondary | ICD-10-CM

## 2021-11-19 MED ORDER — HYDROXYZINE HCL 25 MG PO TABS: 25.0000 mg | ORAL_TABLET | Freq: Every day | ORAL | 1 refills | Status: DC

## 2021-11-19 MED ORDER — CYCLOBENZAPRINE HCL 10 MG PO TABS: 10.0000 mg | ORAL_TABLET | Freq: Three times a day (TID) | ORAL | 1 refills | Status: DC

## 2021-11-19 NOTE — Addendum Note (Signed)
Addended bySilvio Pate on: 11/19/2021 01:38 PM ? ? Modules accepted: Orders ? ?

## 2021-12-02 ENCOUNTER — Telehealth: Payer: Self-pay | Admitting: Neurology

## 2021-12-02 NOTE — Telephone Encounter (Signed)
Received noted from [employer], they state patient is recommended to have bone density scan. It looks like she had in June 2022, does she need this year or wait for the two years?  ?

## 2021-12-03 NOTE — Telephone Encounter (Signed)
Bone Density every two years.  ?

## 2021-12-20 ENCOUNTER — Other Ambulatory Visit: Payer: Self-pay | Admitting: Neurology

## 2021-12-20 DIAGNOSIS — I2510 Atherosclerotic heart disease of native coronary artery without angina pectoris: Secondary | ICD-10-CM

## 2021-12-20 MED ORDER — METOPROLOL TARTRATE 25 MG PO TABS: ORAL_TABLET | ORAL | 0 refills | Status: DC

## 2021-12-23 ENCOUNTER — Other Ambulatory Visit: Payer: Self-pay | Admitting: Neurology

## 2021-12-23 DIAGNOSIS — F331 Major depressive disorder, recurrent, moderate: Secondary | ICD-10-CM

## 2021-12-23 MED ORDER — ARIPIPRAZOLE 5 MG PO TABS: 5.0000 mg | ORAL_TABLET | Freq: Every day | ORAL | 0 refills | Status: DC

## 2021-12-31 ENCOUNTER — Ambulatory Visit: Payer: 59 | Admitting: Physician Assistant

## 2022-01-03 ENCOUNTER — Ambulatory Visit: Payer: 59 | Admitting: Physician Assistant

## 2022-01-04 ENCOUNTER — Ambulatory Visit (INDEPENDENT_AMBULATORY_CARE_PROVIDER_SITE_OTHER): Payer: 59 | Admitting: Physician Assistant

## 2022-01-04 ENCOUNTER — Encounter: Payer: Self-pay | Admitting: Physician Assistant

## 2022-01-04 VITALS — BP 131/64 | HR 98 | Ht 64.0 in | Wt 128.0 lb

## 2022-01-04 DIAGNOSIS — L409 Psoriasis, unspecified: Secondary | ICD-10-CM

## 2022-01-04 DIAGNOSIS — R339 Retention of urine, unspecified: Secondary | ICD-10-CM

## 2022-01-04 DIAGNOSIS — G3184 Mild cognitive impairment, so stated: Secondary | ICD-10-CM | POA: Diagnosis not present

## 2022-01-04 DIAGNOSIS — Z9181 History of falling: Secondary | ICD-10-CM | POA: Diagnosis not present

## 2022-01-04 DIAGNOSIS — M5136 Other intervertebral disc degeneration, lumbar region: Secondary | ICD-10-CM

## 2022-01-04 DIAGNOSIS — R300 Dysuria: Secondary | ICD-10-CM

## 2022-01-04 DIAGNOSIS — M51369 Other intervertebral disc degeneration, lumbar region without mention of lumbar back pain or lower extremity pain: Secondary | ICD-10-CM

## 2022-01-04 DIAGNOSIS — D508 Other iron deficiency anemias: Secondary | ICD-10-CM

## 2022-01-04 DIAGNOSIS — E039 Hypothyroidism, unspecified: Secondary | ICD-10-CM | POA: Diagnosis not present

## 2022-01-04 DIAGNOSIS — R413 Other amnesia: Secondary | ICD-10-CM | POA: Insufficient documentation

## 2022-01-04 DIAGNOSIS — N3000 Acute cystitis without hematuria: Secondary | ICD-10-CM

## 2022-01-04 DIAGNOSIS — F419 Anxiety disorder, unspecified: Secondary | ICD-10-CM

## 2022-01-04 DIAGNOSIS — F331 Major depressive disorder, recurrent, moderate: Secondary | ICD-10-CM

## 2022-01-04 DIAGNOSIS — R2231 Localized swelling, mass and lump, right upper limb: Secondary | ICD-10-CM

## 2022-01-04 MED ORDER — BUSPIRONE HCL 10 MG PO TABS: 10.0000 mg | ORAL_TABLET | Freq: Two times a day (BID) | ORAL | 0 refills | Status: DC

## 2022-01-04 MED ORDER — DICLOFENAC SODIUM 1 % EX GEL: 4.0000 g | Freq: Four times a day (QID) | CUTANEOUS | 1 refills | Status: DC

## 2022-01-04 MED ORDER — ARIPIPRAZOLE 10 MG PO TABS: 10.0000 mg | ORAL_TABLET | Freq: Every day | ORAL | 0 refills | Status: DC

## 2022-01-04 NOTE — Patient Instructions (Signed)
Increased buspar 10mg  twice a day.  ?Increased abilify 10mg  once a day.  ? ?

## 2022-01-04 NOTE — Progress Notes (Signed)
? ?Subjective:  ? ? Patient ID: Rachael Douglas, female    DOB: [birth year], 66 y.o.   MRN: [id number] ? ?HPI ?Pt is a 66 yo female who presents to the clinic to follow up and get medication refills. She has a lot of anxiety and feels like not controlled. She wants xanax. Her daughter and a friend live with her and very stressfull for her. No Si/HC. She is taking her medication.  ? ? ?Concerned about breast mass in right axilla. No pain. Not growing. No redness.  ? ?Colonoscopy scheduled may 9th.  ? ?.. ?Active Ambulatory Problems  ?  Diagnosis Date Noted  ? Hypothyroidism 05/16/2014  ? Anxiety disorder 05/16/2014  ? Aortic heart valve narrowing 04/12/2013  ? Arthropathia 07/10/2008  ? Leg pain 12/04/2012  ? DDD (degenerative disc disease), lumbar 05/16/2014  ? Anxiety and depression 05/16/2014  ? Gastric catarrh 08/30/2013  ? Acid reflux 05/16/2014  ? HLD (hyperlipidemia) 05/16/2014  ? Arteriosclerosis of coronary artery 08/30/2013  ? OP (osteoporosis) 05/16/2014  ? Major depressive disorder, recurrent episode ([place]) 05/16/2014  ? COPD, moderate ([place]) 09/25/2014  ? Stricture and stenosis of esophagus 10/23/2014  ? Aortic valve replaced 10/23/2014  ? Constipation 12/17/2014  ? Complication of surgery 63/33/5456  ? Left foot pain 02/05/2016  ? Failure to attend appointment 05/05/2016  ? Diarrhea 08/01/2016  ? Multiple gastric ulcers 09/05/2016  ? Collagenous colitis 09/09/2016  ? Right shoulder pain 11/23/2016  ? Left shoulder pain 11/23/2016  ? Trochanteric bursitis of both hips 12/07/2016  ? Primary insomnia 05/16/2017  ? Chronic prescription benzodiazepine use 07/04/2017  ? Left carotid bruit 08/15/2017  ? Actinic keratoses 12/03/2017  ? Pruritus 02/21/2018  ? Ankle ligament laxity, right 05/28/2018  ? Eczema 05/30/2018  ? Palpable mass of lower back 05/31/2018  ? Chronic bilateral low back pain without sciatica 05/31/2018  ? Easy bruising 09/12/2018  ? Family history of Alzheimer's disease 07/29/2019  ? Headache  02/26/2020  ? Closed fracture of distal end of both radius and ulna with routine healing, subsequent encounter 03/09/2021  ? Mild cognitive impairment 01/04/2022  ? At high risk for falls 01/04/2022  ? ?Resolved Ambulatory Problems  ?  Diagnosis Date Noted  ? Rash and nonspecific skin eruption 07/04/2017  ? Anxiety 05/31/2018  ? ?Past Medical History:  ?Diagnosis Date  ? Arthritis   ? Heart disease   ? Hyperlipidemia   ? Hypertension   ? PUD (peptic ulcer disease)   ? Thyroid disease   ? ? ? ? ?Review of Systems ?See HPI.  ?   ?Objective:  ? Physical Exam ?Vitals reviewed.  ?Constitutional:   ?   Appearance: Normal appearance.  ?Cardiovascular:  ?   Rate and Rhythm: Normal rate and regular rhythm.  ?   Pulses: Normal pulses.  ?   Heart sounds: Murmur heard.  ?Neurological:  ?   General: No focal deficit present.  ?   Mental Status: She is alert.  ?Psychiatric:     ?   Mood and Affect: Mood normal.  ? ? ? ? ? ?.. ? ?  01/04/2022  ?  3:49 PM 07/24/2019  ? 11:09 AM  ?Montreal Cognitive Assessment   ?Visuospatial/ Executive (0/5) 4 1  ?Naming (0/3) 3 2  ?Attention: Read list of digits (0/2) 2 1  ?Attention: Read list of letters (0/1) 1 1  ?Attention: Serial 7 subtraction starting at 100 (0/3) 1 1  ?Language: Repeat phrase (0/2) 2 2  ?Language : Fluency (  0/1) 0 0  ?Abstraction (0/2) 2 2  ?Delayed Recall (0/5) 1 1  ?Orientation (0/6) 6 5  ?Total 22 16  ?Adjusted Score (based on education) 23 17  ? ?.. ? ?  01/04/2022  ?  4:23 PM 09/24/2021  ?  1:29 PM 03/05/2021  ? 11:26 AM 03/01/2021  ?  1:17 PM 07/24/2019  ? 11:34 AM  ?Depression screen PHQ 2/9  ?Decreased Interest 3 0 2 0 0  ?Down, Depressed, Hopeless 2 0 2 2 0  ?PHQ - 2 Score 5 0 4 2 0  ?Altered sleeping 3 1 3  0 1  ?Tired, decreased energy 3 2 2  0 1  ?Change in appetite 0 0 2 0 0  ?Feeling bad or failure about yourself  0 3 0 1 0  ?Trouble concentrating 3 3 2  0 3  ?Moving slowly or fidgety/restless 3 0 0 0 0  ?Suicidal thoughts 0 0 0 0 0  ?PHQ-9 Score 17 9 13 3 5   ?Difficult  doing work/chores Very difficult Somewhat difficult Somewhat difficult Not difficult at all Somewhat difficult  ? ?.. ? ?  01/04/2022  ?  4:23 PM 09/24/2021  ?  1:30 PM 03/05/2021  ? 11:26 AM 07/24/2019  ? 11:36 AM  ?GAD 7 : Generalized Anxiety Score  ?Nervous, Anxious, on Edge 3 3 2 1   ?Control/stop worrying 3 3 2 2   ?Worry too much - different things 2 3 2 2   ?Trouble relaxing 3 3 2 1   ?Restless 3 3 2 1   ?Easily annoyed or irritable 3 1 2  0  ?Afraid - awful might happen 3 1 1  0  ?Total GAD 7 Score 20 17 13 7   ?Anxiety Difficulty Very difficult Somewhat difficult Somewhat difficult Not difficult at all  ? ? ?.. ?Results for orders placed or performed in visit on 01/04/22  ?TSH  ?Result Value Ref Range  ? TSH 1.64 0.40 - 4.50 mIU/L  ?Fe+TIBC+Fer  ?Result Value Ref Range  ? Iron 15 (L) 45 - 160 mcg/dL  ? TIBC 462 (H) 250 - 450 mcg/dL (calc)  ? %SAT 3 (L) 16 - 45 % (calc)  ? Ferritin 2 (L) 16 - 288 ng/mL  ?CBC w/Diff/Platelet  ?Result Value Ref Range  ? WBC 6.8 3.8 - 10.8 Thousand/uL  ? RBC 4.47 3.80 - 5.10 Million/uL  ? Hemoglobin 10.3 (L) 11.7 - 15.5 g/dL  ? HCT 34.0 (L) 35.0 - 45.0 %  ? MCV 76.1 (L) 80.0 - 100.0 fL  ? MCH 23.0 (L) 27.0 - 33.0 pg  ? MCHC 30.3 (L) 32.0 - 36.0 g/dL  ? RDW 15.7 (H) 11.0 - 15.0 %  ? Platelets 371 140 - 400 Thousand/uL  ? MPV 9.9 7.5 - 12.5 fL  ? Neutro Abs 4,842 1,500 - 7,800 cells/uL  ? Lymphs Abs 1,367 850 - 3,900 cells/uL  ? Absolute Monocytes 449 200 - 950 cells/uL  ? Eosinophils Absolute 122 15 - 500 cells/uL  ? Basophils Absolute 20 0 - 200 cells/uL  ? Neutrophils Relative % 71.2 %  ? Total Lymphocyte 20.1 %  ? Monocytes Relative 6.6 %  ? Eosinophils Relative 1.8 %  ? Basophils Relative 0.3 %  ?COMPLETE METABOLIC PANEL WITH GFR  ?Result Value Ref Range  ? Glucose, Bld 93 65 - 99 mg/dL  ? BUN 11 7 - 25 mg/dL  ? Creat 0.78 0.50 - 1.05 mg/dL  ? eGFR 83 > OR = 60 mL/min/1.31m  ? BUN/Creatinine Ratio NOT APPLICABLE 6 -  22 (calc)  ? Sodium 140 135 - 146 mmol/L  ? Potassium 4.2 3.5 - 5.3  mmol/L  ? Chloride 101 98 - 110 mmol/L  ? CO2 31 20 - 32 mmol/L  ? Calcium 9.1 8.6 - 10.4 mg/dL  ? Total Protein 6.6 6.1 - 8.1 g/dL  ? Albumin 4.4 3.6 - 5.1 g/dL  ? Globulin 2.2 1.9 - 3.7 g/dL (calc)  ? AG Ratio 2.0 1.0 - 2.5 (calc)  ? Total Bilirubin 0.3 0.2 - 1.2 mg/dL  ? Alkaline phosphatase (APISO) 64 37 - 153 U/L  ? AST 15 10 - 35 U/L  ? ALT 13 6 - 29 U/L  ? ? ? ? ?Assessment & Plan:  ?..Feliz was seen today for follow-up. ? ?Diagnoses and all orders for this visit: ? ?Moderate episode of recurrent major depressive disorder ([place]) ?-     ARIPiprazole (ABILIFY) 10 MG tablet; Take 1 tablet (10 mg total) by mouth daily. ? ?Dysuria ? ?Urinary retention ? ?Acute cystitis without hematuria ? ?At high risk for falls ? ?Mild cognitive impairment ?-     TSH ?-     Fe+TIBC+Fer ?-     CBC w/Diff/Platelet ?-     COMPLETE METABOLIC PANEL WITH GFR ? ?Acquired hypothyroidism ?-     TSH ? ?Psoriasis ? ?Anxiety ?-     busPIRone (BUSPAR) 10 MG tablet; Take 1 tablet (10 mg total) by mouth 2 (two) times daily. ? ?DDD (degenerative disc disease), lumbar ?-     diclofenac Sodium (VOLTAREN) 1 % GEL; Apply 4 g topically 4 (four) times daily. To affected joint. ? ?Iron deficiency anemia secondary to inadequate dietary iron intake ?-     ferrous sulfate 325 (65 FE) MG EC tablet; Take 1 tablet (325 mg total) by mouth daily with breakfast. ? ? ?PHQ and GAD no to goal ?Increased buspar to 21m ?Increased abilify to 152m ?Continue cymbalta ?Cognitive impairment scores are better ?Continue on aricept ?Pt wants xanax but on ambien and oxycodone. Discussed coming off oxycodone and then maybe a small dose of xanax ?Labs ordered.  ?Follow up in 3 months.  ? ? ? ? ?

## 2022-01-05 LAB — COMPLETE METABOLIC PANEL WITH GFR
AG Ratio: 2 (calc) (ref 1.0–2.5)
ALT: 13 U/L (ref 6–29)
AST: 15 U/L (ref 10–35)
Albumin: 4.4 g/dL (ref 3.6–5.1)
Alkaline phosphatase (APISO): 64 U/L (ref 37–153)
BUN: 11 mg/dL (ref 7–25)
CO2: 31 mmol/L (ref 20–32)
Calcium: 9.1 mg/dL (ref 8.6–10.4)
Chloride: 101 mmol/L (ref 98–110)
Creat: 0.78 mg/dL (ref 0.50–1.05)
Globulin: 2.2 g/dL (calc) (ref 1.9–3.7)
Glucose, Bld: 93 mg/dL (ref 65–99)
Potassium: 4.2 mmol/L (ref 3.5–5.3)
Sodium: 140 mmol/L (ref 135–146)
Total Bilirubin: 0.3 mg/dL (ref 0.2–1.2)
Total Protein: 6.6 g/dL (ref 6.1–8.1)
eGFR: 83 mL/min/{1.73_m2} (ref >=60)

## 2022-01-05 LAB — CBC WITH DIFFERENTIAL/PLATELET
Absolute Monocytes: 449 cells/uL (ref 200–950)
Basophils Absolute: 20 cells/uL (ref 0–200)
Basophils Relative: 0.3 %
Eosinophils Absolute: 122 cells/uL (ref 15–500)
Eosinophils Relative: 1.8 %
HCT: 34 % — ABNORMAL LOW (ref 35.0–45.0)
Hemoglobin: 10.3 g/dL — ABNORMAL LOW (ref 11.7–15.5)
Lymphs Abs: 1367 cells/uL (ref 850–3900)
MCH: 23 pg — ABNORMAL LOW (ref 27.0–33.0)
MCHC: 30.3 g/dL — ABNORMAL LOW (ref 32.0–36.0)
MCV: 76.1 fL — ABNORMAL LOW (ref 80.0–100.0)
MPV: 9.9 fL (ref 7.5–12.5)
Monocytes Relative: 6.6 %
Neutro Abs: 4842 cells/uL (ref 1500–7800)
Neutrophils Relative %: 71.2 %
Platelets: 371 10*3/uL (ref 140–400)
RBC: 4.47 10*6/uL (ref 3.80–5.10)
RDW: 15.7 % — ABNORMAL HIGH (ref 11.0–15.0)
Total Lymphocyte: 20.1 %
WBC: 6.8 10*3/uL (ref 3.8–10.8)

## 2022-01-05 LAB — IRON,TIBC AND FERRITIN PANEL
%SAT: 3 % (calc) — ABNORMAL LOW (ref 16–45)
Ferritin: 2 ng/mL — ABNORMAL LOW (ref 16–288)
Iron: 15 ug/dL — ABNORMAL LOW (ref 45–160)
TIBC: 462 mcg/dL (calc) — ABNORMAL HIGH (ref 250–450)

## 2022-01-05 LAB — TSH: TSH: 1.64 mIU/L (ref 0.40–4.50)

## 2022-01-05 MED ORDER — FERROUS SULFATE 325 (65 FE) MG PO TBEC
325.0000 mg | DELAYED_RELEASE_TABLET | Freq: Every day | ORAL | 3 refills | Status: DC
Start: 2022-01-05 — End: 2022-09-23

## 2022-01-05 NOTE — Progress Notes (Signed)
Thyroid looks great.  ?Kidney and liver look good.  ?Glucose looks good.  ?Hemoglobin is low but has been for the last 10 months ?Serum iron and ferritin very low. ?Are you taking oral iron?

## 2022-01-05 NOTE — Progress Notes (Signed)
I sent ferrous sulfate to pharmacy to see if cheaper to take once daily with breakfast.

## 2022-01-08 ENCOUNTER — Other Ambulatory Visit: Payer: Self-pay | Admitting: Physician Assistant

## 2022-01-08 DIAGNOSIS — F331 Major depressive disorder, recurrent, moderate: Secondary | ICD-10-CM

## 2022-01-11 ENCOUNTER — Other Ambulatory Visit: Payer: Self-pay | Admitting: Physician Assistant

## 2022-01-11 DIAGNOSIS — M5416 Radiculopathy, lumbar region: Secondary | ICD-10-CM

## 2022-01-17 ENCOUNTER — Emergency Department (INDEPENDENT_AMBULATORY_CARE_PROVIDER_SITE_OTHER)
Admission: EM | Admit: 2022-01-17 | Discharge: 2022-01-17 | Disposition: A | Discharge status: Home / Self Care | Payer: 59 | Source: Home / Self Care

## 2022-01-17 DIAGNOSIS — J309 Allergic rhinitis, unspecified: Secondary | ICD-10-CM

## 2022-01-17 DIAGNOSIS — J0100 Acute maxillary sinusitis, unspecified: Secondary | ICD-10-CM | POA: Diagnosis not present

## 2022-01-17 DIAGNOSIS — R059 Cough, unspecified: Secondary | ICD-10-CM

## 2022-01-17 MED ORDER — PREDNISONE 20 MG PO TABS: ORAL_TABLET | ORAL | 0 refills | Status: DC

## 2022-01-17 MED ORDER — BENZONATATE 200 MG PO CAPS: 200.0000 mg | ORAL_CAPSULE | Freq: Three times a day (TID) | ORAL | 0 refills | Status: AC | PRN

## 2022-01-17 MED ORDER — CEFDINIR 300 MG PO CAPS: 300.0000 mg | ORAL_CAPSULE | Freq: Two times a day (BID) | ORAL | 0 refills | Status: AC

## 2022-01-17 MED ORDER — FEXOFENADINE HCL 180 MG PO TABS: 180.0000 mg | ORAL_TABLET | Freq: Every day | ORAL | 0 refills | Status: DC

## 2022-01-17 NOTE — ED Triage Notes (Signed)
Pt states that she has some facial pain, right ear pain, cough and some nasal congestion. X1 week ? ? ? ?Pt states that she is vaccinated for covid. ?Pt states that she has had flu vaccine.  ?

## 2022-01-17 NOTE — Discharge Instructions (Addendum)
Advised patient to take medication as directed with food to completion.  Instructed patient to take Prednisone and Allegra with first dose of Cefdinir for the next 5 of 10 days.  Advised may use Allegra as needed afterwards for concurrent postnasal drainage/drip.  Advised may use Tessalon Perles daily or as needed for cough.  Encouraged patient to increase daily water intake while taking these medications.  Advised patient if symptoms worsen and/or unresolved please follow-up with PCP or here for further evaluation. ?

## 2022-01-17 NOTE — ED Provider Notes (Signed)
?[facility] ? ? ? ?CSN: [id number] ?Arrival date & time: 01/17/22  1200 ? ? ?  ? ?History   ?Chief Complaint ?Chief Complaint  ?Patient presents with  ? Facial Pain  ?  Facial pain, right ear pain, cough and nasal congestion. X1 week  ? ? ?HPI ?Shaili A Cogan is a 66 y.o. female.  ? ?HPI 66 year old female presents with facial pain, right ear pain, cough, nasal congestion for 1 week.  PMH significant for CAD and HTN. ?Past Medical History:  ?Diagnosis Date  ? Arthritis   ? Heart disease   ? Hyperlipidemia   ? Hypertension   ? PUD (peptic ulcer disease)   ? Thyroid disease   ? ? ?Patient Active Problem List  ? Diagnosis Date Noted  ? Mild cognitive impairment 01/04/2022  ? At high risk for falls 01/04/2022  ? Closed fracture of distal end of both radius and ulna with routine healing, subsequent encounter 03/09/2021  ? Headache 02/26/2020  ? Family history of Alzheimer's disease 07/29/2019  ? Easy bruising 09/12/2018  ? Palpable mass of lower back 05/31/2018  ? Chronic bilateral low back pain without sciatica 05/31/2018  ? Eczema 05/30/2018  ? Ankle ligament laxity, right 05/28/2018  ? Pruritus 02/21/2018  ? Actinic keratoses 12/03/2017  ? Left carotid bruit 08/15/2017  ? Chronic prescription benzodiazepine use 07/04/2017  ? Primary insomnia 05/16/2017  ? Trochanteric bursitis of both hips 12/07/2016  ? Right shoulder pain 11/23/2016  ? Left shoulder pain 11/23/2016  ? Collagenous colitis 09/09/2016  ? Multiple gastric ulcers 09/05/2016  ? Diarrhea 08/01/2016  ? Failure to attend appointment 05/05/2016  ? Left foot pain 02/05/2016  ? Complication of surgery 11/13/2015  ? Constipation 12/17/2014  ? Stricture and stenosis of esophagus 10/23/2014  ? Aortic valve replaced 10/23/2014  ? COPD, moderate ([facility]) 09/25/2014  ? Hypothyroidism 05/16/2014  ? Anxiety disorder 05/16/2014  ? DDD (degenerative disc disease), lumbar 05/16/2014  ? Anxiety and depression 05/16/2014  ? Acid reflux 05/16/2014  ? HLD  (hyperlipidemia) 05/16/2014  ? OP (osteoporosis) 05/16/2014  ? Major depressive disorder, recurrent episode ([facility]) 05/16/2014  ? Gastric catarrh 08/30/2013  ? Arteriosclerosis of coronary artery 08/30/2013  ? Aortic heart valve narrowing 04/12/2013  ? Leg pain 12/04/2012  ? Arthropathia 07/10/2008  ? ? ?Past Surgical History:  ?Procedure Laterality Date  ? AORTIC VALVE REPLACEMENT    ? TOTAL ABDOMINAL HYSTERECTOMY    ? ? ?OB History   ?No obstetric history on file. ?  ? ? ? ?Home Medications   ? ?Prior to Admission medications   ?Medication Sig Start Date End Date Taking? Authorizing Provider  ?albuterol (VENTOLIN HFA) 108 (90 Base) MCG/ACT inhaler INHALE 2 PUFFS EVERY 6 HOURS AS NEEDED 04/23/14  Yes [provider]  ?AMBULATORY NON FORMULARY MEDICATION In and out catheter and supplies to self catherize every night. 10/05/21  Yes Breeback, Jade L, PA-C  ?AMBULATORY NON FORMULARY MEDICATION Rollator with seat. 10/19/21  Yes Breeback, Jade L, PA-C  ?ARIPiprazole (ABILIFY) 10 MG tablet Take 1 tablet (10 mg total) by mouth daily. 01/04/22  Yes Breeback, Jade L, PA-C  ?benzonatate (TESSALON) 200 MG capsule Take 1 capsule (200 mg total) by mouth 3 (three) times daily as needed for up to 7 days for cough. 01/17/22 01/24/22 Yes Trevor Iha, FNP  ?busPIRone (BUSPAR) 10 MG tablet Take 1 tablet (10 mg total) by mouth 2 (two) times daily. 01/04/22  Yes Breeback, Jade L, PA-C  ?busPIRone (BUSPAR) 5 MG tablet  Take 1 tablet (5 mg total) by mouth 2 (two) times daily. 09/24/21  Yes Breeback, Jade L, PA-C  ?cefdinir (OMNICEF) 300 MG capsule Take 1 capsule (300 mg total) by mouth 2 (two) times daily for 10 days. 01/17/22 01/27/22 Yes Trevor Ihaagan, Ladena Jacquez, FNP  ?cyclobenzaprine (FLEXERIL) 10 MG tablet Take 1 tablet (10 mg total) by mouth 3 (three) times daily. 11/19/21  Yes Breeback, Jade L, PA-C  ?diclofenac Sodium (VOLTAREN) 1 % GEL Apply 4 g topically 4 (four) times daily. To affected joint. 01/04/22  Yes Breeback, Jade L, PA-C  ?donepezil  (ARICEPT) 10 MG tablet Take 1 tablet (10 mg total) by mouth at bedtime. 03/05/21  Yes Breeback, Jade L, PA-C  ?DULoxetine (CYMBALTA) 60 MG capsule Take 2 capsules (120 mg total) by mouth daily. 11/01/21  Yes Breeback, Jade L, PA-C  ?EASY-LAX PLUS 8.6-50 MG tablet TAKE 2 TABLETS BY MOUTH DAILY FOR 10 DAYS 02/20/21  Yes [provider]  ?famotidine (PEPCID) 40 MG tablet Take 1 tablet (40 mg total) by mouth at bedtime. 03/05/21  Yes Breeback, Jade L, PA-C  ?ferrous sulfate 325 (65 FE) MG EC tablet Take 1 tablet (325 mg total) by mouth daily with breakfast. 01/05/22  Yes Breeback, Jade L, PA-C  ?fexofenadine (ALLEGRA ALLERGY) 180 MG tablet Take 1 tablet (180 mg total) by mouth daily for 15 days. 01/17/22 02/01/22 Yes Trevor Ihaagan, Shirlyn [place]in, FNP  ?fluticasone (FLONASE) 50 MCG/ACT nasal spray Place 2 sprays into both nostrils daily. 04/23/21  Yes Hyman HopesBeck, Taylor B, NP  ?furosemide (LASIX) 40 MG tablet TAKE 1 TABLET(40 MG) BY MOUTH DAILY AS NEEDED FOR SWELLING 11/01/21  Yes Breeback, Jade L, PA-C  ?gabapentin (NEURONTIN) 600 MG tablet TAKE 2 TABLETS(1200 MG) BY MOUTH THREE TIMES DAILY 10/20/21  Yes Breeback, Jade L, PA-C  ?hydrOXYzine (ATARAX) 25 MG tablet Take 1 tablet (25 mg total) by mouth at bedtime. 11/19/21  Yes Breeback, Jade L, PA-C  ?ibuprofen (ADVIL) 800 MG tablet TAKE 1 TABLET(800 MG) BY MOUTH EVERY 6 HOURS AS NEEDED 10/14/21  Yes Breeback, Jade L, PA-C  ?levothyroxine (SYNTHROID) 75 MCG tablet TAKE 1 TABLET(75 MCG) BY MOUTH DAILY BEFORE BREAKFAST 04/01/21  Yes Breeback, Jade L, PA-C  ?LINZESS 290 MCG CAPS capsule TAKE 1 CAPSULE(290 MCG) BY MOUTH DAILY 11/19/21  Yes Breeback, Jade L, PA-C  ?metoprolol tartrate (LOPRESSOR) 25 MG tablet TAKE 1 TABLET(25 MG) BY MOUTH TWICE DAILY 12/20/21  Yes Breeback, Jade L, PA-C  ?mometasone-formoterol (DULERA) 200-5 MCG/ACT AERO Inhale 1 puff into the lungs daily. 03/10/15  Yes Hommel, Sean, DO  ?naloxone (NARCAN) nasal spray 4 mg/0.1 mL USE 1 SPRAY INTRANASALLY EVERY 2-3 MINUTES UNTIL EMERGENCY TEAM  HAS ARRIVED. USE IN OPIOD EMERGENCY ONLY AS DIRECTED. 10/30/20  Yes [provider]  ?nystatin (MYCOSTATIN) 100000 UNIT/ML suspension Take 5 mLs (500,000 Units total) by mouth 4 (four) times daily. Swish for 30 seconds and spit out. 09/14/20  Yes Breeback, Jade L, PA-C  ?ondansetron (ZOFRAN ODT) 4 MG disintegrating tablet Take 1 tablet (4 mg total) by mouth every 8 (eight) hours as needed for nausea or vomiting. 03/02/20  Yes Breeback, Jade L, PA-C  ?OTEZLA 30 MG TABS Take 1 tablet by mouth 2 (two) times daily. 12/17/21  Yes [provider]  ?oxyCODONE (OXY IR/ROXICODONE) 5 MG immediate release tablet Take 5 mg by mouth 4 (four) times daily as needed. 02/19/21  Yes [provider]  ?oxyCODONE-acetaminophen (PERCOCET) 10-325 MG tablet Take 1 tablet by mouth every 6 (six) hours as needed. 11/21/19  Yes  [provider]  ?pantoprazole (PROTONIX) 40 MG tablet Take 1 tablet (40 mg total) by mouth daily. 03/08/21  Yes Breeback, Jade L, PA-C  ?potassium chloride (KLOR-CON) 10 MEQ tablet TAKE 1 TABLET(10 MEQ) BY MOUTH DAILY 03/29/21  Yes Breeback, Jade L, PA-C  ?pravastatin (PRAVACHOL) 80 MG tablet TAKE 1 TABLET(80 MG) BY MOUTH DAILY 03/30/21  Yes Breeback, Jade L, PA-C  ?predniSONE (DELTASONE) 20 MG tablet Take 2 tabs PO daily x 5 days. 01/17/22  Yes Trevor Iha, FNP  ?triamcinolone (KENALOG) 0.1 % paste APPLY A THIN LAYER ON MOUTH ULCERS 2 TO 3 TIMES DAILY AFTER MEALS FOR 5 TO 7 DAYS 09/01/21  Yes Breeback, Jade L, PA-C  ?triamcinolone cream (KENALOG) 0.1 % APPLY TOPICALLY TO THE AFFECTED AREA TWICE DAILY. AVOID FACE 04/22/20  Yes Early, Sung Amabile, NP  ?Vitamin D, Ergocalciferol, (DRISDOL) 1.25 MG (50000 UNIT) CAPS capsule TAKE 1 CAPSULE BY MOUTH EVERY 7 DAYS 03/05/21  Yes Breeback, Jade L, PA-C  ?zolpidem (AMBIEN) 10 MG tablet Take 1 tablet (10 mg total) by mouth at bedtime as needed for sleep. 10/15/21  Yes Breeback, Jade L, PA-C  ? ? ?Family History ?Family History  ?Problem Relation Age of Onset  ?  Cancer Other   ? Depression Other   ? Diabetes Other   ? Hypertension Other   ? Breast cancer Other   ? ? ?Social History ?Social History  ? ?Tobacco Use  ? Smoking status: Former  ?  Types: E-cigarettes  ?  Quit date:

## 2022-01-18 ENCOUNTER — Other Ambulatory Visit: Payer: Self-pay | Admitting: Physician Assistant

## 2022-01-18 ENCOUNTER — Ambulatory Visit: Payer: 59 | Admitting: Physician Assistant

## 2022-01-18 DIAGNOSIS — F331 Major depressive disorder, recurrent, moderate: Secondary | ICD-10-CM

## 2022-01-21 ENCOUNTER — Other Ambulatory Visit: Payer: Self-pay | Admitting: Physician Assistant

## 2022-01-21 ENCOUNTER — Encounter: Payer: Self-pay | Admitting: Physician Assistant

## 2022-01-21 DIAGNOSIS — D508 Other iron deficiency anemias: Secondary | ICD-10-CM | POA: Insufficient documentation

## 2022-01-21 DIAGNOSIS — L409 Psoriasis, unspecified: Secondary | ICD-10-CM | POA: Insufficient documentation

## 2022-01-21 DIAGNOSIS — F331 Major depressive disorder, recurrent, moderate: Secondary | ICD-10-CM

## 2022-01-21 DIAGNOSIS — R2231 Localized swelling, mass and lump, right upper limb: Secondary | ICD-10-CM | POA: Insufficient documentation

## 2022-01-21 DIAGNOSIS — M5416 Radiculopathy, lumbar region: Secondary | ICD-10-CM

## 2022-02-05 ENCOUNTER — Other Ambulatory Visit: Payer: Self-pay | Admitting: Physician Assistant

## 2022-02-05 DIAGNOSIS — R413 Other amnesia: Secondary | ICD-10-CM

## 2022-02-05 DIAGNOSIS — S52509D Unspecified fracture of the lower end of unspecified radius, subsequent encounter for closed fracture with routine healing: Secondary | ICD-10-CM

## 2022-02-05 DIAGNOSIS — K219 Gastro-esophageal reflux disease without esophagitis: Secondary | ICD-10-CM

## 2022-02-07 ENCOUNTER — Other Ambulatory Visit: Payer: Self-pay | Admitting: Physician Assistant

## 2022-02-07 DIAGNOSIS — E039 Hypothyroidism, unspecified: Secondary | ICD-10-CM

## 2022-02-07 MED ORDER — PANTOPRAZOLE SODIUM 40 MG PO TBEC: 40.0000 mg | DELAYED_RELEASE_TABLET | Freq: Every day | ORAL | 3 refills | Status: DC

## 2022-02-15 ENCOUNTER — Telehealth: Payer: Self-pay | Admitting: Neurology

## 2022-02-15 DIAGNOSIS — E039 Hypothyroidism, unspecified: Secondary | ICD-10-CM

## 2022-02-15 MED ORDER — LEVOTHYROXINE SODIUM 75 MCG PO TABS: ORAL_TABLET | ORAL | 3 refills | Status: DC

## 2022-02-15 NOTE — Telephone Encounter (Signed)
Received call from [facility] stating "someone deleted" patient's RX for Levothyroxine this weekend and they would like [place] to resend. RX sent.

## 2022-02-17 ENCOUNTER — Other Ambulatory Visit: Payer: Self-pay | Admitting: Neurology

## 2022-02-17 DIAGNOSIS — F419 Anxiety disorder, unspecified: Secondary | ICD-10-CM

## 2022-02-17 NOTE — Telephone Encounter (Signed)
Patient left vm stating at last visit that we would send Hydroxyzine for her to take during the day as well as at night but RX just states at night. Can you send new RX for increased dosage?

## 2022-02-18 MED ORDER — HYDROXYZINE HCL 25 MG PO TABS: 25.0000 mg | ORAL_TABLET | Freq: Three times a day (TID) | ORAL | 1 refills | Status: DC | PRN

## 2022-02-23 ENCOUNTER — Telehealth: Payer: Self-pay

## 2022-02-23 NOTE — Telephone Encounter (Signed)
Pt would like to know if PCP would be willing to prescribe tramadol for her and her not have to go to pain management.  Please advise.  Tiajuana Amass, CMA

## 2022-02-25 NOTE — Telephone Encounter (Signed)
Spoke with patient. She states she has been off oxycodone for 2 months. She states that she "lost her medication" so she has been out of it. Has been using [facility] tramadol - taking 2 tablets twice daily. Appt made to discuss pain. Aware we will not send anything until appt and expressed understanding. [place].

## 2022-02-25 NOTE — Telephone Encounter (Signed)
Pt states that she needs a refill of tramadol at this time.  Charyl Bigger, CMA

## 2022-02-25 NOTE — Telephone Encounter (Signed)
..  PDMP reviewed during this encounter. 6/12 she should be out of pain medication. You should have 3 more days. We could send on Monday but we have to set you up on a pain contract to get her started. 150 tablets of oxycodone to tramadol twice a day is a BIG change. Is she prepared for the difference?

## 2022-03-02 ENCOUNTER — Ambulatory Visit: Payer: 59 | Admitting: Physician Assistant

## 2022-03-02 ENCOUNTER — Telehealth: Payer: Self-pay | Admitting: Physician Assistant

## 2022-03-02 NOTE — Telephone Encounter (Signed)
Pt called and said she is sorry for missing her appt today. She said she totally forgot about the appt. She wants 2 things.   To come in earlier then 6/27  To not get charge a no-show fee for today's missed appt  Please advise.

## 2022-03-03 ENCOUNTER — Other Ambulatory Visit: Payer: Self-pay | Admitting: Neurology

## 2022-03-03 DIAGNOSIS — F5101 Primary insomnia: Secondary | ICD-10-CM

## 2022-03-03 NOTE — Telephone Encounter (Signed)
We can not fit her in sooner than that appointment if there are no appointments available. I will defer to [facility] on no show charge.

## 2022-03-03 NOTE — Telephone Encounter (Signed)
Last written 10/15/2021 #90 with 1 refill Last appt 01/04/2022 Please advise.

## 2022-03-04 MED ORDER — ZOLPIDEM TARTRATE 10 MG PO TABS: 10.0000 mg | ORAL_TABLET | Freq: Every evening | ORAL | 1 refills | Status: DC | PRN

## 2022-03-07 ENCOUNTER — Ambulatory Visit (INDEPENDENT_AMBULATORY_CARE_PROVIDER_SITE_OTHER): Payer: 59 | Admitting: Physician Assistant

## 2022-03-07 DIAGNOSIS — Z0000 Encounter for general adult medical examination without abnormal findings: Secondary | ICD-10-CM

## 2022-03-07 NOTE — Progress Notes (Signed)
MEDICARE ANNUAL WELLNESS VISIT  03/07/2022  Telephone Visit Disclaimer This Medicare AWV was conducted by telephone due to national recommendations for restrictions regarding the COVID-19 Pandemic (e.g. social distancing).  I verified, using two identifiers, that I am speaking with Rachael Douglas or their authorized healthcare agent. I discussed the limitations, risks, security, and privacy concerns of performing an evaluation and management service by telephone and the potential availability of an in-person appointment in the future. The patient expressed understanding and agreed to proceed.  Location of Patient: Home Location of Provider (nurse):  In the office.  Subjective:    Rachael Douglas is a 66 y.o. female patient of Rachael Douglas, Rachael Car, PA-C who had a [employer] Visit today via telephone. Rachael Douglas is Retired and lives with their spouse and her daughter. she has 2 children. she reports that she is socially active and does interact with friends/family regularly. she is minimally physically active and enjoys watching television and reading.  Patient Care Team: Rachael Douglas as PCP - General (Family Medicine)     03/07/2022    1:12 PM 03/01/2021    1:16 PM 09/25/2014   10:19 AM  Advanced Directives  Does Patient Have a Medical Advance Directive? No No No  Would patient like information on creating a medical advance directive? No - Patient declined No - Patient declined Yes - Educational materials given    Hospital Utilization Over the Past 12 Months: # of hospitalizations or ER visits: 0 # of surgeries: 0  Review of Systems    Patient reports that her overall health is unchanged compared to last year.  History obtained from chart review and the patient  Patient Reported Readings (BP, Pulse, CBG, Weight, etc) none  Pain Assessment Pain : 0-10 Pain Score: 8  Pain Type: Chronic pain Pain Location: Back Pain Orientation: Lower Pain Descriptors /  Indicators: Aching Pain Onset: More than a month ago Pain Frequency: Constant Pain Relieving Factors: medication  Pain Relieving Factors: medication  Current Medications & Allergies (verified) Allergies as of 03/07/2022       Reactions   Penicillins Swelling   Ranitidine Other (See Comments)   Facial swelling   Alendronate Other (See Comments)   unknown   Bisphosphonates    Nausea/stomach pains.         Medication List        Accurate as of March 07, 2022  1:23 PM. If you have any questions, ask your nurse or doctor.          albuterol 108 (90 Base) MCG/ACT inhaler Commonly known as: VENTOLIN HFA INHALE 2 PUFFS EVERY 6 HOURS AS NEEDED   AMBULATORY NON FORMULARY MEDICATION In and out catheter and supplies to self catherize every night.   AMBULATORY NON FORMULARY MEDICATION Rollator with seat.   ARIPiprazole 10 MG tablet Commonly known as: Abilify Take 1 tablet (10 mg total) by mouth daily.   busPIRone 5 MG tablet Commonly known as: BUSPAR Take 1 tablet (5 mg total) by mouth 2 (two) times daily.   busPIRone 10 MG tablet Commonly known as: BUSPAR Take 1 tablet (10 mg total) by mouth 2 (two) times daily.   cyclobenzaprine 10 MG tablet Commonly known as: FLEXERIL Take 1 tablet (10 mg total) by mouth 3 (three) times daily.   diclofenac Sodium 1 % Gel Commonly known as: VOLTAREN Apply 4 g topically 4 (four) times daily. To affected joint.   donepezil 10 MG tablet Commonly known as:  ARICEPT TAKE 1 TABLET(10 MG) BY MOUTH AT BEDTIME   DULoxetine 60 MG capsule Commonly known as: CYMBALTA TAKE 2 CAPSULES(120 MG) BY MOUTH DAILY   Easy-Lax Plus 8.6-50 MG tablet Generic drug: senna-docusate TAKE 2 TABLETS BY MOUTH DAILY FOR 10 DAYS   famotidine 40 MG tablet Commonly known as: PEPCID TAKE 1 TABLET(40 MG) BY MOUTH AT BEDTIME   ferrous sulfate 325 (65 FE) MG EC tablet Take 1 tablet (325 mg total) by mouth daily with breakfast.   fexofenadine 180 MG  tablet Commonly known as: Allegra Allergy Take 1 tablet (180 mg total) by mouth daily for 15 days.   fluticasone 50 MCG/ACT nasal spray Commonly known as: Flonase Place 2 sprays into both nostrils daily.   furosemide 40 MG tablet Commonly known as: LASIX TAKE 1 TABLET(40 MG) BY MOUTH DAILY AS NEEDED FOR SWELLING What changed: See the new instructions.   gabapentin 600 MG tablet Commonly known as: NEURONTIN TAKE 2 TABLETS(1200 MG) BY MOUTH THREE TIMES DAILY   hydrOXYzine 25 MG tablet Commonly known as: ATARAX Take 1 tablet (25 mg total) by mouth every 8 (eight) hours as needed for anxiety.   ibuprofen 800 MG tablet Commonly known as: ADVIL TAKE 1 TABLET(800 MG) BY MOUTH EVERY 6 HOURS AS NEEDED   levothyroxine 75 MCG tablet Commonly known as: SYNTHROID TAKE 1 TABLET(75 MCG) BY MOUTH DAILY BEFORE BREAKFAST   Linzess 290 MCG Caps capsule Generic drug: linaclotide TAKE 1 CAPSULE(290 MCG) BY MOUTH DAILY   metoprolol tartrate 25 MG tablet Commonly known as: LOPRESSOR TAKE 1 TABLET(25 MG) BY MOUTH TWICE DAILY   mometasone-formoterol 200-5 MCG/ACT Aero Commonly known as: DULERA Inhale 1 puff into the lungs daily.   naloxone 4 MG/0.1ML Liqd nasal spray kit Commonly known as: NARCAN USE 1 SPRAY INTRANASALLY EVERY 2-3 MINUTES UNTIL EMERGENCY TEAM HAS ARRIVED. USE IN OPIOD EMERGENCY ONLY AS DIRECTED.   nystatin 100000 UNIT/ML suspension Commonly known as: MYCOSTATIN Take 5 mLs (500,000 Units total) by mouth 4 (four) times daily. Swish for 30 seconds and spit out.   ondansetron 4 MG disintegrating tablet Commonly known as: Zofran ODT Take 1 tablet (4 mg total) by mouth every 8 (eight) hours as needed for nausea or vomiting.   Otezla 30 MG Tabs Generic drug: Apremilast Take 1 tablet by mouth 2 (two) times daily.   oxyCODONE 5 MG immediate release tablet Commonly known as: Oxy IR/ROXICODONE Take 5 mg by mouth 4 (four) times daily as needed.   oxyCODONE-acetaminophen  10-325 MG tablet Commonly known as: PERCOCET Take 1 tablet by mouth every 6 (six) hours as needed.   pantoprazole 40 MG tablet Commonly known as: PROTONIX Take 1 tablet (40 mg total) by mouth daily.   potassium chloride 10 MEQ tablet Commonly known as: KLOR-CON TAKE 1 TABLET(10 MEQ) BY MOUTH DAILY   pravastatin 80 MG tablet Commonly known as: PRAVACHOL TAKE 1 TABLET(80 MG) BY MOUTH DAILY   predniSONE 20 MG tablet Commonly known as: DELTASONE Take 2 tabs PO daily x 5 days.   triamcinolone 0.1 % paste Commonly known as: KENALOG APPLY A THIN LAYER ON MOUTH ULCERS 2 TO 3 TIMES DAILY AFTER MEALS FOR 5 TO 7 DAYS   triamcinolone cream 0.1 % Commonly known as: KENALOG APPLY TOPICALLY TO THE AFFECTED AREA TWICE DAILY. AVOID FACE   Vitamin D (Ergocalciferol) 1.25 MG (50000 UNIT) Caps capsule Commonly known as: DRISDOL TAKE 1 CAPSULE BY MOUTH EVERY 7 DAYS   zolpidem 10 MG tablet Commonly known as: AMBIEN  Take 1 tablet (10 mg total) by mouth at bedtime as needed for sleep.        History (reviewed): Past Medical History:  Diagnosis Date   Arthritis    Heart disease    Hyperlipidemia    Hypertension    PUD (peptic ulcer disease)    Thyroid disease    Past Surgical History:  Procedure Laterality Date   AORTIC VALVE REPLACEMENT     TOTAL ABDOMINAL HYSTERECTOMY     Family History  Problem Relation Age of Onset   Cancer Other    Depression Other    Diabetes Other    Hypertension Other    Breast cancer Other    Social History   Socioeconomic History   Marital status: Married    Spouse name: Fritz Pickerel   Number of children: 2   Years of education: 12   Highest education level: 12th grade  Occupational History    Comment: Retired  Tobacco Use   Smoking status: Former    Types: E-cigarettes    Quit date: 05/30/2014    Years since quitting: 7.7   Smokeless tobacco: Never  Vaping Use   Vaping Use: Some days   Substances: Nicotine  Substance and Sexual Activity    Alcohol use: No    Alcohol/week: 0.0 standard drinks of alcohol   Drug use: No   Sexual activity: Not Currently  Other Topics Concern   Not on file  Social History Narrative   Lives with her husband. She enjoys reading and watching television.   Social Determinants of Health   Financial Resource Strain: Low Risk  (03/07/2022)   Overall Financial Resource Strain (CARDIA)    Difficulty of Paying Living Expenses: Not hard at all  Food Insecurity: No Food Insecurity (03/07/2022)   Hunger Vital Sign    Worried About Running Out of Food in the Last Year: Never true    Ran Out of Food in the Last Year: Never true  Transportation Needs: No Transportation Needs (03/07/2022)   PRAPARE - Hydrologist (Medical): No    Lack of Transportation (Non-Medical): No  Physical Activity: Inactive (03/07/2022)   Exercise Vital Sign    Days of Exercise per Week: 0 days    Minutes of Exercise per Session: 0 min  Stress: No Stress Concern Present (03/07/2022)   [place]    Feeling of Stress : Not at all  Social Connections: Moderately Isolated (03/07/2022)   Social Connection and Isolation Panel [NHANES]    Frequency of Communication with Friends and Family: More than three times a week    Frequency of Social Gatherings with Friends and Family: More than three times a week    Attends Religious Services: Never    Marine scientist or Organizations: No    Attends Archivist Meetings: Never    Marital Status: Married    Activities of Daily Living    03/07/2022    1:18 PM  In your present state of health, do you have any difficulty performing the following activities:  Hearing? 0  Vision? 1  Comment she was just diagnosed with macular degeneration.  Difficulty concentrating or making decisions? 0  Walking or climbing stairs? 0  Dressing or bathing? 0  Doing errands, shopping? 0  Preparing Food  and eating ? N  Using the Toilet? N  In the past six months, have you accidently leaked urine? Y  Comment sometimes.  Do you have problems with loss of bowel control? N  Managing your Medications? N  Managing your Finances? N  Housekeeping or managing your Housekeeping? N    Patient Education/ Literacy How often do you need to have someone help you when you read instructions, pamphlets, or other written materials from your doctor or pharmacy?: 1 - Never What is the last grade level you completed in school?: 12th grade  Exercise Current Exercise Habits: The patient does not participate in regular exercise at present, Exercise limited by: orthopedic condition(s) (arthritis and back pain)  Diet Patient reports consuming 1 meals a day and 1 snack(s) a day Patient reports that her primary diet is: Regular Patient reports that she does have regular access to food.   Depression Screen    03/07/2022    1:17 PM 03/07/2022    1:12 PM 01/04/2022    4:23 PM 09/24/2021    1:29 PM 03/05/2021   11:26 AM 03/01/2021    1:17 PM 07/24/2019   11:34 AM  PHQ 2/9 Scores  PHQ - 2 Score 0 0 5 0 4 2 0  PHQ- 9 Score 0  17 9 13 3 5      Fall Risk    03/07/2022    1:12 PM 01/04/2022    3:48 PM 03/05/2021   11:27 AM 03/01/2021    1:17 PM 07/26/2019    2:22 PM  Fall Risk   Falls in the past year? 1 1 1 1 1   Number falls in past yr: 1 1 1 1 1   Injury with Fall? 0 1 1 1 1   Risk for fall due to : History of fall(s) History of fall(s) History of fall(s) History of fall(s) History of fall(s)  Follow up Falls evaluation completed;Education provided;Falls prevention discussed Falls evaluation completed Falls evaluation completed Falls evaluation completed;Education provided;Falls prevention discussed Falls prevention discussed     Objective:  Cobi A Mowbray seemed alert and oriented and she participated appropriately during our telephone visit.  Blood Pressure Weight BMI  BP Readings from Last 3 Encounters:   01/17/22 102/61  01/04/22 131/64  10/05/21 (!) 129/56   Wt Readings from Last 3 Encounters:  01/17/22 130 lb (59 kg)  01/04/22 128 lb (58.1 kg)  10/05/21 128 lb 1.9 oz (58.1 kg)   BMI Readings from Last 1 Encounters:  01/17/22 22.31 kg/m    *Unable to obtain current vital signs, weight, and BMI due to telephone visit type  Hearing/Vision  Maridee did not seem to have difficulty with hearing/understanding during the telephone conversation Reports that she has had a formal eye exam by an eye care professional within the past year Reports that she has not had a formal hearing evaluation within the past year *Unable to fully assess hearing and vision during telephone visit type  Cognitive Function:    03/07/2022    1:20 PM 03/01/2021    1:30 PM  6CIT Screen  What Year? 0 points 0 points  What month? 0 points 0 points  What time? 0 points 0 points  Count back from 20 0 points 0 points  Months in reverse 0 points 2 points  Repeat phrase 2 points 0 points  Total Score 2 points 2 points   (Normal:0-7, Significant for Dysfunction: >8)  Normal Cognitive Function Screening: Yes   Immunization & Health Maintenance Record Immunization History  Administered Date(s) Administered   Fluad Quad(high Dose 65+) 09/24/2021   Influenza, Seasonal, Injecte, Preservative Fre 08/08/2013   Influenza,inj,Quad PF,6+ Mos  08/08/2013, 10/07/2015, 11/23/2016, 05/31/2018, 07/09/2019   Influenza,trivalent, recombinat, inj, PF 07/07/2010   PFIZER(Purple Top)SARS-COV-2 Vaccination 12/21/2019, 01/11/2020, 07/29/2020   PNEUMOCOCCAL CONJUGATE-20 09/24/2021   Pneumococcal Conjugate-13 11/23/2016   Pneumococcal Polysaccharide-23 07/07/2010   Tdap 02/01/2012    Health Maintenance  Topic Date Due   COVID-19 Vaccine (4 - Booster for Pfizer series) 03/23/2022 (Originally 09/23/2020)   Zoster Vaccines- Shingrix (1 of 2) 04/05/2022 (Originally 12/06/1974)   TETANUS/TDAP  03/08/2023 (Originally 01/31/2022)    INFLUENZA VACCINE  04/19/2022   MAMMOGRAM  07/01/2023   COLONOSCOPY (Pts 45-78yr Insurance coverage will need to be confirmed)  09/05/2026   Pneumonia Vaccine 67 Years old  Completed   DEXA SCAN  Completed   Hepatitis C Screening  Completed   HPV VACCINES  Aged Out       Assessment  This is a routine wellness examination for [employer]  Health Maintenance: Due or Overdue There are no preventive care reminders to display for this patient.   Sharlet A Finder does not need a referral for Community Assistance: Care Management:   no Social Work:    no Prescription Assistance:  no Nutrition/Diabetes Education:  no   Plan:  Personalized Goals  Goals Addressed               This Visit's Progress     Patient Stated (pt-stated)        03/07/2022 AWV Goal: Exercise for General Health  Patient will verbalize understanding of the benefits of increased physical activity: Exercising regularly is important. It will improve your overall fitness, flexibility, and endurance. Regular exercise also will improve your overall health. It can help you control your weight, reduce stress, and improve your bone density. Over the next year, patient will increase physical activity as tolerated with a goal of at least 150 minutes of moderate physical activity per week.  You can tell that you are exercising at a moderate intensity if your heart starts beating faster and you start breathing faster but can still hold a conversation. Moderate-intensity exercise ideas include: Walking 1 mile (1.6 km) in about 15 minutes Biking Hiking Golfing Dancing Water aerobics Patient will verbalize understanding of everyday activities that increase physical activity by providing examples like the following: Yard work, such as: PSales promotion account executiveGardening Washing windows or floors Patient will be able to explain general  safety guidelines for exercising:  Before you start a new exercise program, talk with your health care provider. Do not exercise so much that you hurt yourself, feel dizzy, or get very short of breath. Wear comfortable clothes and wear shoes with good support. Drink plenty of water while you exercise to prevent dehydration or heat stroke. Work out until your breathing and your heartbeat get faster.        Personalized Health Maintenance & Screening Recommendations  Td vaccine Shingrix vaccine  Lung Cancer Screening Recommended: yes (Low Dose CT Chest recommended if Age 67-80years, 30 pack-year currently smoking OR have quit w/in past 15 years) Hepatitis C Screening recommended: no HIV Screening recommended: no  Advanced Directives: Written information was not prepared per patient's request.  Referrals & Orders No orders of the defined types were placed in this encounter.   Follow-up Plan Follow-up with BDonella Stade PA-C as planned Schedule your appointment at your pharmacy at tetanus and shingrix vaccine. Medicare wellness visit in one year. AVS printed and mailed to the patient.  I have personally reviewed and noted the following in the patient's chart:   Medical and social history Use of alcohol, tobacco or illicit drugs  Current medications and supplements Functional ability and status Nutritional status Physical activity Advanced directives List of other physicians Hospitalizations, surgeries, and ER visits in previous 12 months Vitals Screenings to include cognitive, depression, and falls Referrals and appointments  In addition, I have reviewed and discussed with Yohanna A Dunivan certain preventive protocols, quality metrics, and best practice recommendations. A written personalized care plan for preventive services as well as general preventive health recommendations is available and can be mailed to the patient at her request.      Tinnie Gens,  RN-BSN  03/07/2022

## 2022-03-07 NOTE — Patient Instructions (Addendum)
MEDICARE ANNUAL WELLNESS VISIT Health Maintenance Summary and Written Plan of Care  Rachael Douglas ,  Thank you for allowing me to perform your Medicare Annual Wellness Visit and for your ongoing commitment to your health.   Health Maintenance & Immunization History Health Maintenance  Topic Date Due  . COVID-19 Vaccine (4 - Booster for Pfizer series) 03/23/2022 (Originally 09/23/2020)  . Zoster Vaccines- Shingrix (1 of 2) 04/05/2022 (Originally 12/06/1974)  . TETANUS/TDAP  03/08/2023 (Originally 01/31/2022)  . INFLUENZA VACCINE  04/19/2022  . MAMMOGRAM  07/01/2023  . COLONOSCOPY (Pts 45-23yrs Insurance coverage will need to be confirmed)  09/05/2026  . Pneumonia Vaccine 74+ Years old  Completed  . DEXA SCAN  Completed  . Hepatitis C Screening  Completed  . HPV VACCINES  Aged Out   Immunization History  Administered Date(s) Administered  . Fluad Quad(high Dose 65+) 09/24/2021  . Influenza, Seasonal, Injecte, Preservative Fre 08/08/2013  . Influenza,inj,Quad PF,6+ Mos 08/08/2013, 10/07/2015, 11/23/2016, 05/31/2018, 07/09/2019  . Influenza,trivalent, recombinat, inj, PF 07/07/2010  . PFIZER(Purple Top)SARS-COV-2 Vaccination 12/21/2019, 01/11/2020, 07/29/2020  . PNEUMOCOCCAL CONJUGATE-20 09/24/2021  . Pneumococcal Conjugate-13 11/23/2016  . Pneumococcal Polysaccharide-23 07/07/2010  . Tdap 02/01/2012    These are the patient goals that we discussed:  Goals Addressed              This Visit's Progress   .  Patient Stated (pt-stated)        03/07/2022 AWV Goal: Exercise for General Health  Patient will verbalize understanding of the benefits of increased physical activity: Exercising regularly is important. It will improve your overall fitness, flexibility, and endurance. Regular exercise also will improve your overall health. It can help you control your weight, reduce stress, and improve your bone density. Over the next year, patient will increase physical activity as tolerated  with a goal of at least 150 minutes of moderate physical activity per week.  You can tell that you are exercising at a moderate intensity if your heart starts beating faster and you start breathing faster but can still hold a conversation. Moderate-intensity exercise ideas include: Walking 1 mile (1.6 km) in about 15 minutes Biking Hiking Golfing Dancing Water aerobics Patient will verbalize understanding of everyday activities that increase physical activity by providing examples like the following: Yard work, such as: Insurance underwriter Gardening Washing windows or floors Patient will be able to explain general safety guidelines for exercising:  Before you start a new exercise program, talk with your health care provider. Do not exercise so much that you hurt yourself, feel dizzy, or get very short of breath. Wear comfortable clothes and wear shoes with good support. Drink plenty of water while you exercise to prevent dehydration or heat stroke. Work out until your breathing and your heartbeat get faster.         This is a list of Health Maintenance Items that are overdue or due now: Td vaccine Shingrix vaccine  Orders/Referrals Placed Today: No orders of the defined types were placed in this encounter.  (Contact our referral department at [phone] if you have not spoken with someone about your referral appointment within the next 5 days)    Follow-up Plan Follow-up with Jomarie Longs, PA-C as planned Schedule your appointment at your pharmacy at tetanus and shingrix vaccine. Medicare wellness visit in one year. AVS printed and mailed to the patient.      Health Maintenance, Female Adopting  a healthy lifestyle and getting preventive care are important in promoting health and wellness. Ask your health care provider about: The right schedule for you to have regular tests and  exams. Things you can do on your own to prevent diseases and keep yourself healthy. What should I know about diet, weight, and exercise? Eat a healthy diet  Eat a diet that includes plenty of vegetables, fruits, low-fat dairy products, and lean protein. Do not eat a lot of foods that are high in solid fats, added sugars, or sodium. Maintain a healthy weight Body mass index (BMI) is used to identify weight problems. It estimates body fat based on height and weight. Your health care provider can help determine your BMI and help you achieve or maintain a healthy weight. Get regular exercise Get regular exercise. This is one of the most important things you can do for your health. Most adults should: Exercise for at least 150 minutes each week. The exercise should increase your heart rate and make you sweat (moderate-intensity exercise). Do strengthening exercises at least twice a week. This is in addition to the moderate-intensity exercise. Spend less time sitting. Even light physical activity can be beneficial. Watch cholesterol and blood lipids Have your blood tested for lipids and cholesterol at 67 years of age, then have this test every 5 years. Have your cholesterol levels checked more often if: Your lipid or cholesterol levels are high. You are older than 67 years of age. You are at high risk for heart disease. What should I know about cancer screening? Depending on your health history and family history, you may need to have cancer screening at various ages. This may include screening for: Breast cancer. Cervical cancer. Colorectal cancer. Skin cancer. Lung cancer. What should I know about heart disease, diabetes, and high blood pressure? Blood pressure and heart disease High blood pressure causes heart disease and increases the risk of stroke. This is more likely to develop in people who have high blood pressure readings or are overweight. Have your blood pressure checked: Every  3-5 years if you are 94-45 years of age. Every year if you are 1 years old or older. Diabetes Have regular diabetes screenings. This checks your fasting blood sugar level. Have the screening done: Once every three years after age 47 if you are at a normal weight and have a low risk for diabetes. More often and at a younger age if you are overweight or have a high risk for diabetes. What should I know about preventing infection? Hepatitis B If you have a higher risk for hepatitis B, you should be screened for this virus. Talk with your health care provider to find out if you are at risk for hepatitis B infection. Hepatitis C Testing is recommended for: Everyone born from [birth year] through 1966. Anyone with known risk factors for hepatitis C. Sexually transmitted infections (STIs) Get screened for STIs, including gonorrhea and chlamydia, if: You are sexually active and are younger than 67 years of age. You are older than 67 years of age and your health care provider tells you that you are at risk for this type of infection. Your sexual activity has changed since you were last screened, and you are at increased risk for chlamydia or gonorrhea. Ask your health care provider if you are at risk. Ask your health care provider about whether you are at high risk for HIV. Your health care provider may recommend a prescription medicine to help prevent HIV infection. If you  choose to take medicine to prevent HIV, you should first get tested for HIV. You should then be tested every 3 months for as long as you are taking the medicine. Pregnancy If you are about to stop having your period (premenopausal) and you may become pregnant, seek counseling before you get pregnant. Take 400 to 800 micrograms (mcg) of folic acid every day if you become pregnant. Ask for birth control (contraception) if you want to prevent pregnancy. Osteoporosis and menopause Osteoporosis is a disease in which the bones lose minerals and  strength with aging. This can result in bone fractures. If you are 91 years old or older, or if you are at risk for osteoporosis and fractures, ask your health care provider if you should: Be screened for bone loss. Take a calcium or vitamin D supplement to lower your risk of fractures. Be given hormone replacement therapy (HRT) to treat symptoms of menopause. Follow these instructions at home: Alcohol use Do not drink alcohol if: Your health care provider tells you not to drink. You are pregnant, may be pregnant, or are planning to become pregnant. If you drink alcohol: Limit how much you have to: 0-1 drink a day. Know how much alcohol is in your drink. In the [place] one drink equals one 12 oz bottle of beer (355 mL), one 5 oz glass of wine (148 mL), or one 1 oz glass of hard liquor (44 mL). Lifestyle Do not use any products that contain nicotine or tobacco. These products include cigarettes, chewing tobacco, and vaping devices, such as e-cigarettes. If you need help quitting, ask your health care provider. Do not use street drugs. Do not share needles. Ask your health care provider for help if you need support or information about quitting drugs. General instructions Schedule regular health, dental, and eye exams. Stay current with your vaccines. Tell your health care provider if: You often feel depressed. You have ever been abused or do not feel safe at home. Summary Adopting a healthy lifestyle and getting preventive care are important in promoting health and wellness. Follow your health care provider's instructions about healthy diet, exercising, and getting tested or screened for diseases. Follow your health care provider's instructions on monitoring your cholesterol and blood pressure. This information is not intended to replace advice given to you by your health care provider. Make sure you discuss any questions you have with your health care provider. Document Revised:  01/25/2021 Document Reviewed: 01/25/2021 Elsevier Patient Education  2023 [employer].

## 2022-03-08 ENCOUNTER — Encounter: Payer: Self-pay | Admitting: Emergency Medicine

## 2022-03-08 ENCOUNTER — Emergency Department (INDEPENDENT_AMBULATORY_CARE_PROVIDER_SITE_OTHER)
Admission: EM | Admit: 2022-03-08 | Discharge: 2022-03-08 | Disposition: A | Discharge status: Home / Self Care | Payer: 59 | Source: Home / Self Care

## 2022-03-08 DIAGNOSIS — R350 Frequency of micturition: Secondary | ICD-10-CM

## 2022-03-08 DIAGNOSIS — G894 Chronic pain syndrome: Secondary | ICD-10-CM

## 2022-03-08 DIAGNOSIS — R300 Dysuria: Secondary | ICD-10-CM

## 2022-03-08 LAB — POCT URINALYSIS DIP (MANUAL ENTRY)
Bilirubin, UA: NEGATIVE
Glucose, UA: NEGATIVE mg/dL
Ketones, POC UA: NEGATIVE mg/dL
Leukocytes, UA: NEGATIVE
Nitrite, UA: NEGATIVE
Protein Ur, POC: NEGATIVE mg/dL
Spec Grav, UA: <=1.005 — AB (ref 1.010–1.025)
Urobilinogen, UA: 0.2 E.U./dL
pH, UA: 6 (ref 5.0–8.0)

## 2022-03-08 MED ORDER — TRAMADOL HCL 50 MG PO TABS: 50.0000 mg | ORAL_TABLET | Freq: Two times a day (BID) | ORAL | 0 refills | Status: DC | PRN

## 2022-03-08 MED ORDER — CEPHALEXIN 500 MG PO CAPS: 500.0000 mg | ORAL_CAPSULE | Freq: Two times a day (BID) | ORAL | 0 refills | Status: DC

## 2022-03-08 NOTE — Discharge Instructions (Signed)
Continue to drink lots of water Take the antibiotic 2 times a day for 5 days We will send your urine for culture.  You will be notified of the culture report  I have refilled tramadol 2 times a day for 10 days.  Number 20 tablets only.  This will not be refilled. You need to see your primary care doctor as scheduled in 1 week.
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
## 2022-03-08 NOTE — ED Triage Notes (Addendum)
Pt c/o urinary frequency and urgency and strong odor to her urine for about the last week. States she does get frequent UTI and thinks she had one about 2 months ago. She admits she is supposed to in/out cath each night and has not been doing this.

## 2022-03-10 ENCOUNTER — Other Ambulatory Visit: Payer: Self-pay | Admitting: Physician Assistant

## 2022-03-10 DIAGNOSIS — I2510 Atherosclerotic heart disease of native coronary artery without angina pectoris: Secondary | ICD-10-CM

## 2022-03-10 DIAGNOSIS — F331 Major depressive disorder, recurrent, moderate: Secondary | ICD-10-CM

## 2022-03-11 LAB — URINE CULTURE
MICRO NUMBER:: 13548179
SPECIMEN QUALITY:: ADEQUATE

## 2022-03-15 ENCOUNTER — Ambulatory Visit: Payer: 59 | Admitting: Physician Assistant

## 2022-03-15 ENCOUNTER — Telehealth: Payer: Self-pay | Admitting: Physician Assistant

## 2022-03-15 NOTE — Telephone Encounter (Signed)
Pt called.  She apologized for missing her appointment and asked that we not charge her the no show fee.

## 2022-03-17 ENCOUNTER — Ambulatory Visit (INDEPENDENT_AMBULATORY_CARE_PROVIDER_SITE_OTHER): Payer: 59 | Admitting: Physician Assistant

## 2022-03-17 ENCOUNTER — Encounter: Payer: Self-pay | Admitting: Physician Assistant

## 2022-03-17 ENCOUNTER — Ambulatory Visit: Payer: 59 | Admitting: Physician Assistant

## 2022-03-17 VITALS — BP 120/84 | HR 60 | Ht 64.0 in | Wt 132.0 lb

## 2022-03-17 DIAGNOSIS — F419 Anxiety disorder, unspecified: Secondary | ICD-10-CM | POA: Diagnosis not present

## 2022-03-17 DIAGNOSIS — M5136 Other intervertebral disc degeneration, lumbar region: Secondary | ICD-10-CM

## 2022-03-17 DIAGNOSIS — M545 Low back pain, unspecified: Secondary | ICD-10-CM

## 2022-03-17 DIAGNOSIS — N3000 Acute cystitis without hematuria: Secondary | ICD-10-CM

## 2022-03-17 DIAGNOSIS — F331 Major depressive disorder, recurrent, moderate: Secondary | ICD-10-CM | POA: Diagnosis not present

## 2022-03-17 DIAGNOSIS — G8929 Other chronic pain: Secondary | ICD-10-CM

## 2022-03-17 DIAGNOSIS — G894 Chronic pain syndrome: Secondary | ICD-10-CM

## 2022-03-17 LAB — POCT URINALYSIS DIP (CLINITEK)
Bilirubin, UA: NEGATIVE
Blood, UA: NEGATIVE
Glucose, UA: NEGATIVE mg/dL
Ketones, POC UA: NEGATIVE mg/dL
Leukocytes, UA: NEGATIVE
Nitrite, UA: NEGATIVE
POC PROTEIN,UA: NEGATIVE
Spec Grav, UA: 1.015 (ref 1.010–1.025)
Urobilinogen, UA: 0.2 E.U./dL
pH, UA: 7.5 (ref 5.0–8.0)

## 2022-03-17 MED ORDER — BUSPIRONE HCL 10 MG PO TABS: 10.0000 mg | ORAL_TABLET | Freq: Three times a day (TID) | ORAL | 1 refills | Status: DC

## 2022-03-17 MED ORDER — OXYCODONE-ACETAMINOPHEN 10-325 MG PO TABS: 1.0000 | ORAL_TABLET | Freq: Three times a day (TID) | ORAL | 0 refills | Status: DC | PRN

## 2022-03-17 NOTE — Progress Notes (Signed)
Established Patient Office Visit  Subjective   Patient ID: Rachael Douglas, female    DOB: 1956/04/03  Age: 66 y.o. MRN: [id number]  Chief Complaint  Patient presents with   Follow-up    HPI Pt is a 66 yo female who presents to the clinic with her daughter to discuss chronic pain. Pt was dismissed from pain clinic due to losing her prescription bottle. Patient and patient daughter claim it was stolen. Tramadol was given to her but "not helping". She would like to go back on oxycodone.   Her anxiety is still not controlled and wants to know if anything else she can take.   .. Active Ambulatory Problems    Diagnosis Date Noted   Hypothyroidism 05/16/2014   Anxiety disorder 05/16/2014   Aortic heart valve narrowing 04/12/2013   Arthropathia 07/10/2008   Leg pain 12/04/2012   DDD (degenerative disc disease), lumbar 05/16/2014   Anxiety and depression 05/16/2014   Gastric catarrh 08/30/2013   Acid reflux 05/16/2014   HLD (hyperlipidemia) 05/16/2014   Arteriosclerosis of coronary artery 08/30/2013   OP (osteoporosis) 05/16/2014   Major depressive disorder, recurrent episode (HCC) 05/16/2014   COPD, moderate (HCC) 09/25/2014   Stricture and stenosis of esophagus 10/23/2014   Aortic valve replaced 10/23/2014   Constipation 12/17/2014   Left foot pain 02/05/2016   Diarrhea 08/01/2016   Multiple gastric ulcers 09/05/2016   Collagenous colitis 09/09/2016   Right shoulder pain 11/23/2016   Left shoulder pain 11/23/2016   Trochanteric bursitis of both hips 12/07/2016   Primary insomnia 05/16/2017   Chronic prescription benzodiazepine use 07/04/2017   Left carotid bruit 08/15/2017   Actinic keratoses 12/03/2017   Pruritus 02/21/2018   Ankle ligament laxity, right 05/28/2018   Eczema 05/30/2018   Palpable mass of lower back 05/31/2018   Chronic bilateral low back pain without sciatica 05/31/2018   Anxiety 05/31/2018   Easy bruising 09/12/2018   Family history of Alzheimer's  disease 07/29/2019   Headache 02/26/2020   Closed fracture of distal end of both radius and ulna with routine healing, subsequent encounter 03/09/2021   Mild cognitive impairment 01/04/2022   At high risk for falls 01/04/2022   Psoriasis 01/21/2022   Iron deficiency anemia secondary to inadequate dietary iron intake 01/21/2022   Axillary mass, right 01/21/2022   Chronic pain syndrome 03/17/2022   Resolved Ambulatory Problems    Diagnosis Date Noted   Complication of surgery 11/13/2015   Failure to attend appointment 05/05/2016   Rash and nonspecific skin eruption 07/04/2017   Past Medical History:  Diagnosis Date   Arthritis    Heart disease    Hyperlipidemia    Hypertension    PUD (peptic ulcer disease)    Thyroid disease      Review of Systems  All other systems reviewed and are negative.     Objective:     BP 120/84   Pulse 60   Ht 5\' 4"  (1.626 m)   Wt 132 lb (59.9 kg)   SpO2 100%   BMI 22.66 kg/m  BP Readings from Last 3 Encounters:  03/17/22 120/84  03/08/22 118/80  01/17/22 102/61    ..    03/07/2022    1:17 PM 03/07/2022    1:12 PM 01/04/2022    4:23 PM 09/24/2021    1:29 PM 03/05/2021   11:26 AM  Depression screen PHQ 2/9  Decreased Interest 0 0 3 0 2  Down, Depressed, Hopeless 0 0 2 0 2  PHQ -  2 Score 0 0 5 0 4  Altered sleeping 0  3 1 3   Tired, decreased energy 0  3 2 2   Change in appetite 0  0 0 2  Feeling bad or failure about yourself  0  0 3 0  Trouble concentrating 0  3 3 2   Moving slowly or fidgety/restless 0  3 0 0  Suicidal thoughts 0  0 0 0  PHQ-9 Score 0  17 9 13   Difficult doing work/chores Not difficult at all  Very difficult Somewhat difficult Somewhat difficult   .    01/04/2022    4:23 PM 09/24/2021    1:30 PM 03/05/2021   11:26 AM 07/24/2019   11:36 AM  GAD 7 : Generalized Anxiety Score  Nervous, Anxious, on Edge 3 3 2 1   Control/stop worrying 3 3 2 2   Worry too much - different things 2 3 2 2   Trouble relaxing 3 3 2 1    Restless 3 3 2 1   Easily annoyed or irritable 3 1 2  0  Afraid - awful might happen 3 1 1  0  Total GAD 7 Score 20 17 13 7   Anxiety Difficulty Very difficult Somewhat difficult Somewhat difficult Not difficult at all    .01/06/2022 Results for orders placed or performed in visit on 03/17/22  POCT URINALYSIS DIP (CLINITEK)  Result Value Ref Range   Color, UA yellow yellow   Clarity, UA clear clear   Glucose, UA negative negative mg/dL   Bilirubin, UA negative negative   Ketones, POC UA negative negative mg/dL   Spec Grav, UA 03/07/2021 13/12/2018 - 1.025   Blood, UA negative negative   pH, UA 7.5 5.0 - 8.0   POC PROTEIN,UA negative negative, trace   Urobilinogen, UA 0.2 0.2 or 1.0 E.U./dL   Nitrite, UA Negative Negative   Leukocytes, UA Negative Negative     Physical Exam Constitutional:      Appearance: Normal appearance.  HENT:     Head: Normocephalic.  Cardiovascular:     Rate and Rhythm: Normal rate and regular rhythm.     Pulses: Normal pulses.     Heart sounds: Murmur heard.  Pulmonary:     Effort: Pulmonary effort is normal.     Breath sounds: Normal breath sounds.  Neurological:     General: No focal deficit present.     Mental Status: She is alert and oriented to person, place, and time.  Psychiatric:        Mood and Affect: Mood normal.       Assessment & Plan:    Mahum was seen today for follow-up.  Diagnoses and all orders for this visit:  Chronic pain syndrome -     oxyCODONE-acetaminophen (PERCOCET) 10-325 MG tablet; Take 1 tablet by mouth every 8 (eight) hours as needed. -     oxyCODONE-acetaminophen (PERCOCET) 10-325 MG tablet; Take 1 tablet by mouth every 8 (eight) hours as needed for pain. -     oxyCODONE-acetaminophen (PERCOCET) 10-325 MG tablet; Take 1 tablet by mouth every 8 (eight) hours as needed for pain. -     DRUG MONITORING, PANEL 6 WITH CONFIRMATION, URINE  Anxiety -     busPIRone (BUSPAR) 10 MG tablet; Take 1 tablet (10 mg total) by mouth 3  (three) times daily.  Acute cystitis without hematuria -     POCT URINALYSIS DIP (CLINITEK) -     Urine Culture  Moderate episode of recurrent major depressive disorder (HCC)  DDD (degenerative disc  disease), lumbar -     oxyCODONE-acetaminophen (PERCOCET) 10-325 MG tablet; Take 1 tablet by mouth every 8 (eight) hours as needed. -     oxyCODONE-acetaminophen (PERCOCET) 10-325 MG tablet; Take 1 tablet by mouth every 8 (eight) hours as needed for pain. -     oxyCODONE-acetaminophen (PERCOCET) 10-325 MG tablet; Take 1 tablet by mouth every 8 (eight) hours as needed for pain.  Chronic bilateral low back pain without sciatica -     oxyCODONE-acetaminophen (PERCOCET) 10-325 MG tablet; Take 1 tablet by mouth every 8 (eight) hours as needed. -     oxyCODONE-acetaminophen (PERCOCET) 10-325 MG tablet; Take 1 tablet by mouth every 8 (eight) hours as needed for pain. -     oxyCODONE-acetaminophen (PERCOCET) 10-325 MG tablet; Take 1 tablet by mouth every 8 (eight) hours as needed for pain.   Discussed pain contract and keeping her medications safe Will not fill early and patient is aware of this Must follow up every 3 months .Marland KitchenPDMP reviewed during this encounter. Last fill of oxycodone was 01/28/2022 Last fill of tramadol was just a few days ago Going back to oxycodone from tramadol due to efficacy and history .[name]  Opioid Risk Tool - 03/17/22 1025       Family History of Substance Abuse   Alcohol Negative    Illegal Drugs Negative    Rx Drugs Negative      Personal History of Substance Abuse   Alcohol Negative    Illegal Drugs Negative    Rx Drugs Negative      Age   Age between 16-45 years  No   PT IS 33     History of Preadolescent Sexual Abuse   History of Preadolescent Sexual Abuse Negative or Female      Psychological Disease   Psychological Disease Negative    Depression Positive      Total Score   Opioid Risk Tool Scoring 1    Opioid Risk Interpretation Low Risk             Recent UTI UA looks good today Sent for culture to confirm clearance Pt asymptomatic  Discussed anxiety, reviewed medication list Increased buspar to 10mg  three times a day  , PA-C

## 2022-03-17 NOTE — Patient Instructions (Signed)
Increase buspar to 10mg  three times a day On pain contract Follow up in 3 months

## 2022-03-18 LAB — DRUG MONITORING, PANEL 6 WITH CONFIRMATION, URINE
6 Acetylmorphine: NEGATIVE ng/mL (ref <10)
Alcohol Metabolites: NEGATIVE ng/mL (ref <500)
Amphetamines: NEGATIVE ng/mL (ref <500)
Barbiturates: NEGATIVE ng/mL (ref <300)
Benzodiazepines: NEGATIVE ng/mL (ref <100)
Cocaine Metabolite: NEGATIVE ng/mL (ref <150)
Creatinine: 40.5 mg/dL (ref >=20.0)
Marijuana Metabolite: NEGATIVE ng/mL (ref <20)
Methadone Metabolite: NEGATIVE ng/mL (ref <100)
Opiates: NEGATIVE ng/mL (ref <100)
Oxidant: NEGATIVE ug/mL (ref <200)
Oxycodone: NEGATIVE ng/mL (ref <100)
Phencyclidine: NEGATIVE ng/mL (ref <25)
pH: 7.3 (ref 4.5–9.0)

## 2022-03-18 LAB — DM TEMPLATE

## 2022-03-19 LAB — URINE CULTURE
MICRO NUMBER:: 13593114
SPECIMEN QUALITY:: ADEQUATE

## 2022-03-21 ENCOUNTER — Other Ambulatory Visit: Payer: Self-pay | Admitting: Neurology

## 2022-03-21 DIAGNOSIS — R413 Other amnesia: Secondary | ICD-10-CM

## 2022-03-21 MED ORDER — DONEPEZIL HCL 10 MG PO TABS: ORAL_TABLET | ORAL | 3 refills | Status: DC

## 2022-03-21 NOTE — Progress Notes (Signed)
Urine culture is negative.  No sign of urinary tract infection.

## 2022-04-05 ENCOUNTER — Telehealth: Payer: Self-pay | Admitting: Cardiology

## 2022-04-05 DIAGNOSIS — Z952 Presence of prosthetic heart valve: Secondary | ICD-10-CM

## 2022-04-05 NOTE — Telephone Encounter (Signed)
Patient called wanting to get an Echo prior to her next appointment with Dr. Jens Som.

## 2022-04-05 NOTE — Telephone Encounter (Signed)
Spoke with pt, she reports getting out of breath more than normal for the last several months. She reports SOB walking to the mailbox. She denies edema or orthopnea. She reports when she had her valve replaced they told her that it should last her 10 years and this year is the 10th year and her family is bugging her to get it checked. Aware will forward to dr Jens Som for okay to order.

## 2022-04-05 NOTE — Telephone Encounter (Signed)
Echo order placed.

## 2022-04-06 ENCOUNTER — Ambulatory Visit: Payer: 59 | Admitting: Physician Assistant

## 2022-04-07 ENCOUNTER — Other Ambulatory Visit: Payer: Self-pay | Admitting: Physician Assistant

## 2022-04-07 DIAGNOSIS — F331 Major depressive disorder, recurrent, moderate: Secondary | ICD-10-CM

## 2022-04-09 ENCOUNTER — Other Ambulatory Visit: Payer: Self-pay | Admitting: Physician Assistant

## 2022-04-09 DIAGNOSIS — M5416 Radiculopathy, lumbar region: Secondary | ICD-10-CM

## 2022-04-09 DIAGNOSIS — F331 Major depressive disorder, recurrent, moderate: Secondary | ICD-10-CM

## 2022-04-11 ENCOUNTER — Other Ambulatory Visit: Payer: Self-pay | Admitting: Neurology

## 2022-04-11 DIAGNOSIS — E782 Mixed hyperlipidemia: Secondary | ICD-10-CM

## 2022-04-11 MED ORDER — PRAVASTATIN SODIUM 80 MG PO TABS: ORAL_TABLET | ORAL | 0 refills | Status: DC

## 2022-04-11 MED ORDER — POTASSIUM CHLORIDE ER 10 MEQ PO TBCR: EXTENDED_RELEASE_TABLET | ORAL | 0 refills | Status: DC

## 2022-04-13 LAB — HM COLONOSCOPY

## 2022-04-18 ENCOUNTER — Ambulatory Visit (HOSPITAL_COMMUNITY): Payer: 59 | Attending: Cardiology

## 2022-04-18 DIAGNOSIS — Z952 Presence of prosthetic heart valve: Secondary | ICD-10-CM | POA: Insufficient documentation

## 2022-04-18 LAB — ECHOCARDIOGRAM COMPLETE
AR max vel: 0.86 cm2
AV Area VTI: 0.84 cm2
AV Area mean vel: 0.85 cm2
AV Mean grad: 19.7 mmHg
AV Peak grad: 41.5 mmHg
Ao pk vel: 3.22 m/s
Area-P 1/2: 2.59 cm2

## 2022-04-21 ENCOUNTER — Encounter: Payer: Self-pay | Admitting: *Deleted

## 2022-04-21 ENCOUNTER — Other Ambulatory Visit: Payer: Self-pay | Admitting: Physician Assistant

## 2022-04-21 DIAGNOSIS — F331 Major depressive disorder, recurrent, moderate: Secondary | ICD-10-CM

## 2022-04-27 ENCOUNTER — Other Ambulatory Visit: Payer: Self-pay

## 2022-04-27 DIAGNOSIS — F331 Major depressive disorder, recurrent, moderate: Secondary | ICD-10-CM

## 2022-04-27 MED ORDER — LINACLOTIDE 290 MCG PO CAPS: 290.0000 ug | ORAL_CAPSULE | Freq: Every day | ORAL | 1 refills | Status: DC

## 2022-04-27 MED ORDER — CYCLOBENZAPRINE HCL 10 MG PO TABS: 10.0000 mg | ORAL_TABLET | Freq: Three times a day (TID) | ORAL | 1 refills | Status: DC

## 2022-04-27 MED ORDER — ARIPIPRAZOLE 10 MG PO TABS: 10.0000 mg | ORAL_TABLET | Freq: Every day | ORAL | 0 refills | Status: DC

## 2022-05-05 ENCOUNTER — Telehealth: Payer: Self-pay | Admitting: Neurology

## 2022-05-05 NOTE — Telephone Encounter (Signed)
Called patient and she went ahead and scheduled her follow up on 06/17/22. AMUCK

## 2022-05-05 NOTE — Telephone Encounter (Signed)
Patient lvm on my line asking when she needed to follow up for appt. Last seen 03/17/2022. Needs to follow up 3 months from that date. Please call to schedule.

## 2022-05-11 ENCOUNTER — Encounter: Payer: Self-pay | Admitting: Sports Medicine

## 2022-05-11 ENCOUNTER — Ambulatory Visit (INDEPENDENT_AMBULATORY_CARE_PROVIDER_SITE_OTHER): Payer: 59

## 2022-05-11 ENCOUNTER — Other Ambulatory Visit: Payer: Self-pay | Admitting: Sports Medicine

## 2022-05-11 ENCOUNTER — Ambulatory Visit (INDEPENDENT_AMBULATORY_CARE_PROVIDER_SITE_OTHER): Payer: 59 | Admitting: Sports Medicine

## 2022-05-11 VITALS — BP 134/74 | HR 66 | Ht 64.0 in | Wt 131.0 lb

## 2022-05-11 DIAGNOSIS — M1711 Unilateral primary osteoarthritis, right knee: Secondary | ICD-10-CM | POA: Diagnosis not present

## 2022-05-11 DIAGNOSIS — Z09 Encounter for follow-up examination after completed treatment for conditions other than malignant neoplasm: Secondary | ICD-10-CM | POA: Diagnosis not present

## 2022-05-11 DIAGNOSIS — N390 Urinary tract infection, site not specified: Secondary | ICD-10-CM | POA: Diagnosis not present

## 2022-05-11 DIAGNOSIS — R300 Dysuria: Secondary | ICD-10-CM

## 2022-05-11 LAB — POCT URINALYSIS DIP (CLINITEK)

## 2022-05-11 MED ORDER — CELECOXIB 200 MG PO CAPS: ORAL_CAPSULE | ORAL | 2 refills | Status: DC

## 2022-05-11 MED ORDER — LEVOFLOXACIN 750 MG PO TABS: 750.0000 mg | ORAL_TABLET | Freq: Every day | ORAL | 0 refills | Status: DC

## 2022-05-11 NOTE — Assessment & Plan Note (Signed)
Well and also has psoriasis, osteoarthritis. She has chronic right knee pain, medial joint line with gelling. She also has some weakness in her right ankle, unfortunately this has resulted in her not trusting her legs and she has become quite sedentary. Adding updated x-rays, Celebrex, formal physical therapy to work on her ankles, knees, balance. The ultimate goal will be for her to get on the track and walk with her husband and ultimately get to the [employer] [facility].

## 2022-05-11 NOTE — Progress Notes (Signed)
    Procedures performed today:    None.  Independent interpretation of notes and tests performed by another provider:   None.  Brief History, Exam, Impression, and Recommendations:    Recurrent UTI This is a very pleasant 66 year old female, she has history of multiple UTIs, she has urinary retention, has to catheterize nightly with disposable catheters, she did see a urologist recently, no additional investigation or intervention was recommended. More recently she has had some pelvic pain, dysuria. She cathed herself and there was some blood but no leukocytes or nitrites, we will culture the urine. Previous urinalyses and urine cultures have grown out pansensitive E. coli. Levaquin has the best MIC so we will do this for 10 days. Return to see PCP for this, maybe we could try to taper her off of medications that can worsen urinary retention.  Primary osteoarthritis of right knee Well and also has psoriasis, osteoarthritis. She has chronic right knee pain, medial joint line with gelling. She also has some weakness in her right ankle, unfortunately this has resulted in her not trusting her legs and she has become quite sedentary. Adding updated x-rays, Celebrex, formal physical therapy to work on her ankles, knees, balance. The ultimate goal will be for her to get on the track and walk with her husband and ultimately get to the [employer].    ____________________________________________ Ihor Austin. Benjamin Stain, M.D., ABFM., CAQSM., AME. Primary Care and Sports Medicine [facility]  Adjunct Professor of Family Medicine  [place] [employer]  Restaurant manager, fast food

## 2022-05-11 NOTE — Assessment & Plan Note (Signed)
This is a very pleasant 65 year old female, she has history of multiple UTIs, she has urinary retention, has to catheterize nightly with disposable catheters, she did see a urologist recently, no additional investigation or intervention was recommended. More recently she has had some pelvic pain, dysuria. She cathed herself and there was some blood but no leukocytes or nitrites, we will culture the urine. Previous urinalyses and urine cultures have grown out pansensitive E. coli. Levaquin has the best MIC so we will do this for 10 days. Return to see PCP for this, maybe we could try to taper her off of medications that can worsen urinary retention.

## 2022-05-14 LAB — HOUSE ACCOUNT TRACKING

## 2022-05-14 LAB — URINE CULTURE
MICRO NUMBER:: 13823037
SPECIMEN QUALITY:: ADEQUATE

## 2022-05-16 ENCOUNTER — Ambulatory Visit: Payer: 59 | Admitting: Rehabilitative and Restorative Service Providers"

## 2022-05-16 NOTE — Therapy (Deleted)
OUTPATIENT PHYSICAL THERAPY LOWER EXTREMITY EVALUATION   Patient Name: Rachael Douglas MRN: [id number] DOB:June 01, 1956, 66 y.o., female Today's Date: 05/16/2022    Past Medical History:  Diagnosis Date   Arthritis    Heart disease    Hyperlipidemia    Hypertension    PUD (peptic ulcer disease)    Thyroid disease    Past Surgical History:  Procedure Laterality Date   AORTIC VALVE REPLACEMENT     TOTAL ABDOMINAL HYSTERECTOMY     Patient Active Problem List   Diagnosis Date Noted   Recurrent UTI 05/11/2022   Primary osteoarthritis of right knee 05/11/2022   Chronic pain syndrome 03/17/2022   Psoriasis 01/21/2022   Iron deficiency anemia secondary to inadequate dietary iron intake 01/21/2022   Axillary mass, right 01/21/2022   Mild cognitive impairment 01/04/2022   At high risk for falls 01/04/2022   Closed fracture of distal end of both radius and ulna with routine healing, subsequent encounter 03/09/2021   Headache 02/26/2020   Family history of Alzheimer's disease 07/29/2019   Easy bruising 09/12/2018   Palpable mass of lower back 05/31/2018   Chronic bilateral low back pain without sciatica 05/31/2018   Anxiety 05/31/2018   Eczema 05/30/2018   Ankle ligament laxity, right 05/28/2018   Pruritus 02/21/2018   Actinic keratoses 12/03/2017   Left carotid bruit 08/15/2017   Chronic prescription benzodiazepine use 07/04/2017   Primary insomnia 05/16/2017   Trochanteric bursitis of both hips 12/07/2016   Right shoulder pain 11/23/2016   Left shoulder pain 11/23/2016   Collagenous colitis 09/09/2016   Multiple gastric ulcers 09/05/2016   Diarrhea 08/01/2016   Left foot pain 02/05/2016   Constipation 12/17/2014   Stricture and stenosis of esophagus 10/23/2014   Aortic valve replaced 10/23/2014   COPD, moderate (HCC) 09/25/2014   Hypothyroidism 05/16/2014   Anxiety disorder 05/16/2014   DDD (degenerative disc disease), lumbar 05/16/2014   Anxiety and depression  05/16/2014   Acid reflux 05/16/2014   HLD (hyperlipidemia) 05/16/2014   OP (osteoporosis) 05/16/2014   Major depressive disorder, recurrent episode (HCC) 05/16/2014   Gastric catarrh 08/30/2013   Arteriosclerosis of coronary artery 08/30/2013   Aortic heart valve narrowing 04/12/2013   Leg pain 12/04/2012   Arthropathia 07/10/2008    PCP: Tandy Gaw, PA-C  REFERRING PROVIDER: Dr Lemmie Evens  REFERRING DIAG: OA Rt knee   THERAPY DIAG:  No diagnosis found.  Rationale for Evaluation and Treatment Rehabilitation  ONSET DATE: ***  SUBJECTIVE:   SUBJECTIVE STATEMENT: ***  PERTINENT HISTORY: ***  PAIN:  Are you having pain? Yes: NPRS scale: ***/10 Pain location: *** Pain description: *** Aggravating factors: *** Relieving factors: ***  PRECAUTIONS: {Therapy precautions:24002}  WEIGHT BEARING RESTRICTIONS No  FALLS:  Has patient fallen in last 6 months? {fallsyesno:27318}  LIVING ENVIRONMENT: Lives with: lives with their family and lives with their spouse Lives in: House/apartment Stairs: {opstairs:27293} Has following equipment at home: {Assistive devices:23999}  OCCUPATION: ***  PLOF: {PLOF:24004}  PATIENT GOALS ***   OBJECTIVE:   DIAGNOSTIC FINDINGS: X-ray 05/11/22: Osteopenia. No acute fracture or dislocation. Joint spaces and alignment are maintained for age with minimal joint space narrowing bilaterally. No area of erosion or osseous destruction. No unexpected radiopaque foreign body. No joint effusion. Faint calcifications of the RIGHT knee joint space with differential considerations including meniscal calcification versus Chondrocalcinosis. No acute fracture or dislocation.  PATIENT SURVEYS:  FOTO ***  COGNITION:  Overall cognitive status: Within functional limits for tasks assessed  SENSATION: WFL  EDEMA:  {edema:24020}  MUSCLE LENGTH: Hamstrings: Right *** deg; Left *** deg Maisie Fus test: Right *** deg; Left ***  deg  POSTURE: {posture:25561}  PALPATION: ***  LOWER EXTREMITY ROM:  Active ROM Right eval Left eval  Hip flexion    Hip extension    Hip abduction    Hip adduction    Hip internal rotation    Hip external rotation    Knee flexion    Knee extension    Ankle dorsiflexion    Ankle plantarflexion    Ankle inversion    Ankle eversion     (Blank rows = not tested)  LOWER EXTREMITY MMT:  MMT Right eval Left eval  Hip flexion    Hip extension    Hip abduction    Hip adduction    Hip internal rotation    Hip external rotation    Knee flexion    Knee extension    Ankle dorsiflexion    Ankle plantarflexion    Ankle inversion    Ankle eversion     (Blank rows = not tested)  FUNCTIONAL TESTS:  SLS : Rt ;  Lt   GAIT:  Distance walked: *** Assistive device utilized: {Assistive devices:23999} Level of assistance: {Levels of assistance:24026} Comments: ***    TODAY'S TREATMENT: ***   PATIENT EDUCATION:  Education details: POC; HEP Person educated: Patient Education method: Programmer, multimedia, Facilities manager, Actor cues, Verbal cues, and Handouts Education comprehension: verbalized understanding, returned demonstration, verbal cues required, tactile cues required, and needs further education   HOME EXERCISE PROGRAM: ***  ASSESSMENT:  CLINICAL IMPRESSION: Patient is a 66 y.o. female who was seen today for physical therapy evaluation and treatment for OA of Rt knee.     OBJECTIVE IMPAIRMENTS {opptimpairments:25111}.   ACTIVITY LIMITATIONS {activitylimitations:27494}  PARTICIPATION LIMITATIONS: {participationrestrictions:25113}  PERSONAL FACTORS {Personal factors:25162} are also affecting patient's functional outcome.   REHAB POTENTIAL: Good  CLINICAL DECISION MAKING: Stable/uncomplicated  EVALUATION COMPLEXITY: Low   GOALS: Goals reviewed with patient? Yes    LONG TERM GOALS: Target date: 07/11/2022   *** Baseline:  Goal status: INITIAL  2.   *** Baseline:  Goal status: INITIAL  3.  *** Baseline:  Goal status: INITIAL  4.  *** Baseline:  Goal status: INITIAL  5.  *** Baseline:  Goal status: INITIAL  6.  *** Baseline:  Goal status: INITIAL   PLAN: PT FREQUENCY: 2x/week  PT DURATION: 8 weeks  PLANNED INTERVENTIONS: Therapeutic exercises, Therapeutic activity, Neuromuscular re-education, Balance training, Gait training, Patient/Family education, Self Care, Joint mobilization, Stair training, DME instructions, Aquatic Therapy, Dry Needling, Electrical stimulation, Cryotherapy, Moist heat, Taping, Ultrasound, Manual therapy, and Re-evaluation  PLAN FOR NEXT SESSION: review and progress exercises; manual work/DN; modalities as indicated.   Val Riles, PT, MPH  05/16/2022, 9:08 AM

## 2022-05-25 ENCOUNTER — Other Ambulatory Visit: Payer: Self-pay | Admitting: Physician Assistant

## 2022-05-25 DIAGNOSIS — I2510 Atherosclerotic heart disease of native coronary artery without angina pectoris: Secondary | ICD-10-CM

## 2022-05-27 NOTE — Progress Notes (Deleted)
HPI: FU aortic valve replacement. Previously followed at [facility]. Had bioprosthetic aortic valve replacement in 2014. Apparently had no obstructive coronary disease preoperatively. She has had a previous monitor for palpitations that was normal by report. Echocardiogram December 2018 showed normal LV systolic function, bioprosthetic aortic valve with mean gradient 17 mmHg.  Carotid Dopplers December 2018 showed no significant stenosis. Left subclavian flow was disturbed.  Echo 7/23 showed normal LV function; grade 1 DD; s/p AVR with mean gradient 20 mmgHg and no AI. Recently contacted the office with increased dyspnea and added to my schedule. Since last seen,   Current Outpatient Medications  Medication Sig Dispense Refill   albuterol (VENTOLIN HFA) 108 (90 Base) MCG/ACT inhaler INHALE 2 PUFFS EVERY 6 HOURS AS NEEDED     AMBULATORY NON FORMULARY MEDICATION In and out catheter and supplies to self catherize every night. 100 Device 0   AMBULATORY NON FORMULARY MEDICATION Rollator with seat. 1 each 0   ARIPiprazole (ABILIFY) 10 MG tablet Take 1 tablet (10 mg total) by mouth daily. 90 tablet 0   busPIRone (BUSPAR) 10 MG tablet Take 1 tablet (10 mg total) by mouth 3 (three) times daily. 270 tablet 1   celecoxib (CELEBREX) 200 MG capsule One to 2 tablets by mouth daily as needed for pain. 60 capsule 2   cyclobenzaprine (FLEXERIL) 10 MG tablet Take 1 tablet (10 mg total) by mouth 3 (three) times daily. 90 tablet 1   diclofenac Sodium (VOLTAREN) 1 % GEL Apply 4 g topically 4 (four) times daily. To affected joint. 100 g 1   donepezil (ARICEPT) 10 MG tablet TAKE 1 TABLET(10 MG) BY MOUTH AT BEDTIME 90 tablet 3   DULoxetine (CYMBALTA) 60 MG capsule TAKE 2 CAPSULES BY MOUTH DAILY 180 capsule 0   famotidine (PEPCID) 40 MG tablet TAKE 1 TABLET(40 MG) BY MOUTH AT BEDTIME 90 tablet 3   ferrous sulfate 325 (65 FE) MG EC tablet Take 1 tablet (325 mg total) by mouth daily with breakfast. 90 tablet 3    fluticasone (FLONASE) 50 MCG/ACT nasal spray Place 2 sprays into both nostrils daily. 16 g 2   furosemide (LASIX) 40 MG tablet TAKE 1 TABLET(40 MG) BY MOUTH DAILY AS NEEDED FOR SWELLING 90 tablet 0   gabapentin (NEURONTIN) 600 MG tablet TAKE 2 TABLETS(1200 MG) BY MOUTH THREE TIMES DAILY 540 tablet 0   hydrOXYzine (ATARAX) 25 MG tablet Take 1 tablet (25 mg total) by mouth every 8 (eight) hours as needed for anxiety. 90 tablet 1   levofloxacin (LEVAQUIN) 750 MG tablet Take 1 tablet (750 mg total) by mouth daily. 10 tablet 0   levothyroxine (SYNTHROID) 75 MCG tablet TAKE 1 TABLET(75 MCG) BY MOUTH DAILY BEFORE BREAKFAST 90 tablet 3   linaclotide (LINZESS) 290 MCG CAPS capsule Take 1 capsule (290 mcg total) by mouth daily before breakfast. 90 capsule 1   metoprolol tartrate (LOPRESSOR) 25 MG tablet TAKE 1 TABLET(25 MG) BY MOUTH TWICE DAILY 180 tablet 0   naloxone (NARCAN) nasal spray 4 mg/0.1 mL USE 1 SPRAY INTRANASALLY EVERY 2-3 MINUTES UNTIL EMERGENCY TEAM HAS ARRIVED. USE IN OPIOD EMERGENCY ONLY AS DIRECTED.     ondansetron (ZOFRAN ODT) 4 MG disintegrating tablet Take 1 tablet (4 mg total) by mouth every 8 (eight) hours as needed for nausea or vomiting. 12 tablet 1   OTEZLA 30 MG TABS Take 1 tablet by mouth 2 (two) times daily.     oxyCODONE-acetaminophen (PERCOCET) 10-325 MG tablet Take 1 tablet by  mouth every 8 (eight) hours as needed. 90 tablet 0   oxyCODONE-acetaminophen (PERCOCET) 10-325 MG tablet Take 1 tablet by mouth every 8 (eight) hours as needed for pain. 90 tablet 0   oxyCODONE-acetaminophen (PERCOCET) 10-325 MG tablet Take 1 tablet by mouth every 8 (eight) hours as needed for pain. 90 tablet 0   pantoprazole (PROTONIX) 40 MG tablet Take 1 tablet (40 mg total) by mouth daily. 90 tablet 3   potassium chloride (KLOR-CON) 10 MEQ tablet TAKE 1 TABLET(10 MEQ) BY MOUTH DAILY 90 tablet 0   pravastatin (PRAVACHOL) 80 MG tablet TAKE 1 TABLET(80 MG) BY MOUTH DAILY 90 tablet 0   zolpidem (AMBIEN) 10  MG tablet Take 1 tablet (10 mg total) by mouth at bedtime as needed for sleep. 90 tablet 1   No current facility-administered medications for this visit.     Past Medical History:  Diagnosis Date   Arthritis    Heart disease    Hyperlipidemia    Hypertension    PUD (peptic ulcer disease)    Thyroid disease     Past Surgical History:  Procedure Laterality Date   AORTIC VALVE REPLACEMENT     TOTAL ABDOMINAL HYSTERECTOMY      Social History   Socioeconomic History   Marital status: Married    Spouse name: [name]   Number of children: 2   Years of education: 12   Highest education level: 12th grade  Occupational History    Comment: Retired  Tobacco Use   Smoking status: Former    Types: E-cigarettes    Quit date: 05/30/2014    Years since quitting: 7.9   Smokeless tobacco: Never  Vaping Use   Vaping Use: Some days   Substances: Nicotine  Substance and Sexual Activity   Alcohol use: No    Alcohol/week: 0.0 standard drinks of alcohol   Drug use: No   Sexual activity: Not Currently  Other Topics Concern   Not on file  Social History Narrative   Lives with her husband. She enjoys reading and watching television.   Social Determinants of Health   Financial Resource Strain: Low Risk  (03/07/2022)   Overall Financial Resource Strain (CARDIA)    Difficulty of Paying Living Expenses: Not hard at all  Food Insecurity: No Food Insecurity (03/07/2022)   Hunger Vital Sign    Worried About Running Out of Food in the Last Year: Never true    Ran Out of Food in the Last Year: Never true  Transportation Needs: No Transportation Needs (03/07/2022)   PRAPARE - Administrator, Civil Service (Medical): No    Lack of Transportation (Non-Medical): No  Physical Activity: Inactive (03/07/2022)   Exercise Vital Sign    Days of Exercise per Week: 0 days    Minutes of Exercise per Session: 0 min  Stress: No Stress Concern Present (03/07/2022)   [employer] of  Occupational Health - Occupational Stress Questionnaire    Feeling of Stress : Not at all  Social Connections: Moderately Isolated (03/07/2022)   Social Connection and Isolation Panel [NHANES]    Frequency of Communication with Friends and Family: More than three times a week    Frequency of Social Gatherings with Friends and Family: More than three times a week    Attends Religious Services: Never    Database administrator or Organizations: No    Attends Banker Meetings: Never    Marital Status: Married  Catering manager Violence: Not At Risk (  03/07/2022)   Humiliation, Afraid, Rape, and Kick questionnaire    Fear of Current or Ex-Partner: No    Emotionally Abused: No    Physically Abused: No    Sexually Abused: No    Family History  Problem Relation Age of Onset   Cancer Other    Depression Other    Diabetes Other    Hypertension Other    Breast cancer Other     ROS: no fevers or chills, productive cough, hemoptysis, dysphasia, odynophagia, melena, hematochezia, dysuria, hematuria, rash, seizure activity, orthopnea, PND, pedal edema, claudication. Remaining systems are negative.  Physical Exam: Well-developed well-nourished in no acute distress.  Skin is warm and dry.  HEENT is normal.  Neck is supple.  Chest is clear to auscultation with normal expansion.  Cardiovascular exam is regular rate and rhythm.  Abdominal exam nontender or distended. No masses palpated. Extremities show no edema. neuro grossly intact  ECG- personally reviewed  A/P  1 s/p AVR-Normal function on recent echo; continue SBE prophylaxis.  2 Hypertension-BP controlled; continue present meds.  3 hyperlipidemia-continue statin.  4 dyspnea-  [name], MD

## 2022-05-30 ENCOUNTER — Ambulatory Visit: Payer: 59 | Attending: Cardiology | Admitting: Cardiology

## 2022-05-30 ENCOUNTER — Other Ambulatory Visit: Payer: Self-pay | Admitting: Neurology

## 2022-05-30 DIAGNOSIS — F419 Anxiety disorder, unspecified: Secondary | ICD-10-CM

## 2022-05-30 MED ORDER — HYDROXYZINE HCL 25 MG PO TABS: 25.0000 mg | ORAL_TABLET | Freq: Three times a day (TID) | ORAL | 1 refills | Status: DC | PRN

## 2022-06-17 ENCOUNTER — Encounter: Payer: Self-pay | Admitting: Physician Assistant

## 2022-06-17 ENCOUNTER — Ambulatory Visit (INDEPENDENT_AMBULATORY_CARE_PROVIDER_SITE_OTHER): Payer: 59 | Admitting: Physician Assistant

## 2022-06-17 VITALS — BP 130/69 | HR 82 | Ht 64.0 in | Wt 130.0 lb

## 2022-06-17 DIAGNOSIS — M1711 Unilateral primary osteoarthritis, right knee: Secondary | ICD-10-CM | POA: Diagnosis not present

## 2022-06-17 DIAGNOSIS — R339 Retention of urine, unspecified: Secondary | ICD-10-CM

## 2022-06-17 DIAGNOSIS — Z23 Encounter for immunization: Secondary | ICD-10-CM

## 2022-06-17 DIAGNOSIS — M545 Low back pain, unspecified: Secondary | ICD-10-CM | POA: Diagnosis not present

## 2022-06-17 DIAGNOSIS — M5136 Other intervertebral disc degeneration, lumbar region: Secondary | ICD-10-CM

## 2022-06-17 DIAGNOSIS — G894 Chronic pain syndrome: Secondary | ICD-10-CM | POA: Diagnosis not present

## 2022-06-17 DIAGNOSIS — G8929 Other chronic pain: Secondary | ICD-10-CM

## 2022-06-17 DIAGNOSIS — M51369 Other intervertebral disc degeneration, lumbar region without mention of lumbar back pain or lower extremity pain: Secondary | ICD-10-CM

## 2022-06-17 NOTE — Progress Notes (Addendum)
Subjective:    Patient ID: Rachael Douglas, female    DOB: 03-05-1956, 66 y.o.   MRN: [id number]  HPI Patient is a 66 year old female with COPD, Chronic pain syndrome due to DDD and anxiety/depression who presents to the clinic for medication mangagement.   She is doing ok with pain. She does need refills.   She is struggling with anxiety. She is not taking buspar three times a day. Her daughter lives with her and it is very stressfull.   Pt continues to use self cathers and changing at least once a day.   .. Active Ambulatory Problems    Diagnosis Date Noted   Hypothyroidism 05/16/2014   Anxiety disorder 05/16/2014   Aortic heart valve narrowing 04/12/2013   Arthropathia 07/10/2008   Leg pain 12/04/2012   DDD (degenerative disc disease), lumbar 05/16/2014   Anxiety and depression 05/16/2014   Gastric catarrh 08/30/2013   Acid reflux 05/16/2014   HLD (hyperlipidemia) 05/16/2014   Arteriosclerosis of coronary artery 08/30/2013   OP (osteoporosis) 05/16/2014   Major depressive disorder, recurrent episode ([facility]) 05/16/2014   COPD, moderate ([facility]) 09/25/2014   Stricture and stenosis of esophagus 10/23/2014   Aortic valve replaced 10/23/2014   Constipation 12/17/2014   Left foot pain 02/05/2016   Diarrhea 08/01/2016   Multiple gastric ulcers 09/05/2016   Collagenous colitis 09/09/2016   Right shoulder pain 11/23/2016   Left shoulder pain 11/23/2016   Trochanteric bursitis of both hips 12/07/2016   Primary insomnia 05/16/2017   Chronic prescription benzodiazepine use 07/04/2017   Left carotid bruit 08/15/2017   Actinic keratoses 12/03/2017   Pruritus 02/21/2018   Ankle ligament laxity, right 05/28/2018   Eczema 05/30/2018   Palpable mass of lower back 05/31/2018   Chronic bilateral low back pain without sciatica 05/31/2018   Anxiety 05/31/2018   Easy bruising 09/12/2018   Family history of Alzheimer's disease 07/29/2019   Headache 02/26/2020   Closed fracture of distal  end of both radius and ulna with routine healing, subsequent encounter 03/09/2021   Mild cognitive impairment 01/04/2022   At high risk for falls 01/04/2022   Psoriasis 01/21/2022   Iron deficiency anemia secondary to inadequate dietary iron intake 01/21/2022   Axillary mass, right 01/21/2022   Chronic pain syndrome 03/17/2022   Recurrent UTI 05/11/2022   Primary osteoarthritis of right knee 05/11/2022   Acute stress reaction 09/28/2022   Urinary retention 09/28/2022   Resolved Ambulatory Problems    Diagnosis Date Noted   Complication of surgery [id number]   Failure to attend appointment 05/05/2016   Rash and nonspecific skin eruption 07/04/2017   Past Medical History:  Diagnosis Date   Arthritis    Heart disease    Hyperlipidemia    Hypertension    PUD (peptic ulcer disease)    Thyroid disease        Review of Systems  All other systems reviewed and are negative.      Objective:   Physical Exam Constitutional:      Appearance: Normal appearance.  HENT:     Head: Normocephalic.  Cardiovascular:     Rate and Rhythm: Normal rate and regular rhythm.     Pulses: Normal pulses.     Heart sounds: Murmur heard.  Pulmonary:     Effort: Pulmonary effort is normal.     Breath sounds: Normal breath sounds.  Neurological:     General: No focal deficit present.     Mental Status: She is alert and oriented to person,  place, and time.  Psychiatric:        Mood and Affect: Mood normal.           Assessment & Plan:  Marland KitchenMarland KitchenViolet was seen today for pain management.  Diagnoses and all orders for this visit:  Chronic pain syndrome -     oxyCODONE-acetaminophen (PERCOCET) 10-325 MG tablet; Take 1 tablet by mouth every 8 (eight) hours as needed. -     oxyCODONE-acetaminophen (PERCOCET) 10-325 MG tablet; Take 1 tablet by mouth every 8 (eight) hours as needed for pain. -     Discontinue: oxyCODONE-acetaminophen (PERCOCET) 10-325 MG tablet; Take 1 tablet by mouth every 8  (eight) hours as needed for pain.  Need for immunization against influenza -     Flu Vaccine QUAD High Dose(Fluad)  Chronic bilateral low back pain without sciatica -     oxyCODONE-acetaminophen (PERCOCET) 10-325 MG tablet; Take 1 tablet by mouth every 8 (eight) hours as needed. -     oxyCODONE-acetaminophen (PERCOCET) 10-325 MG tablet; Take 1 tablet by mouth every 8 (eight) hours as needed for pain. -     Discontinue: oxyCODONE-acetaminophen (PERCOCET) 10-325 MG tablet; Take 1 tablet by mouth every 8 (eight) hours as needed for pain.  Primary osteoarthritis of right knee -     oxyCODONE-acetaminophen (PERCOCET) 10-325 MG tablet; Take 1 tablet by mouth every 8 (eight) hours as needed. -     oxyCODONE-acetaminophen (PERCOCET) 10-325 MG tablet; Take 1 tablet by mouth every 8 (eight) hours as needed for pain. -     Discontinue: oxyCODONE-acetaminophen (PERCOCET) 10-325 MG tablet; Take 1 tablet by mouth every 8 (eight) hours as needed for pain.  DDD (degenerative disc disease), lumbar -     oxyCODONE-acetaminophen (PERCOCET) 10-325 MG tablet; Take 1 tablet by mouth every 8 (eight) hours as needed. -     oxyCODONE-acetaminophen (PERCOCET) 10-325 MG tablet; Take 1 tablet by mouth every 8 (eight) hours as needed for pain. -     Discontinue: oxyCODONE-acetaminophen (PERCOCET) 10-325 MG tablet; Take 1 tablet by mouth every 8 (eight) hours as needed for pain.  Urinary retention   .Marland KitchenPDMP reviewed during this encounter. No concerns Pain contract UTD UDS UTD Sent 3 month refills Follow up in 3 months due to [place] controlled substance laws  Discussed anxiety  Increased buspar to three times a day   Self cath once a day ok for refills as needed

## 2022-06-17 NOTE — Patient Instructions (Addendum)
Continue on oxycodone.  Increase buspar to three times a day.

## 2022-06-20 ENCOUNTER — Other Ambulatory Visit: Payer: Self-pay

## 2022-06-20 ENCOUNTER — Encounter: Payer: Self-pay | Admitting: Physician Assistant

## 2022-06-20 DIAGNOSIS — M545 Low back pain, unspecified: Secondary | ICD-10-CM

## 2022-06-20 DIAGNOSIS — M5136 Other intervertebral disc degeneration, lumbar region: Secondary | ICD-10-CM

## 2022-06-20 DIAGNOSIS — G894 Chronic pain syndrome: Secondary | ICD-10-CM

## 2022-06-20 MED ORDER — OXYCODONE-ACETAMINOPHEN 10-325 MG PO TABS: 1.0000 | ORAL_TABLET | Freq: Three times a day (TID) | ORAL | 0 refills | Status: DC | PRN

## 2022-06-20 NOTE — Telephone Encounter (Signed)
Last visit 06/17/2022

## 2022-06-22 ENCOUNTER — Ambulatory Visit: Payer: 59 | Admitting: Sports Medicine

## 2022-06-24 ENCOUNTER — Other Ambulatory Visit: Payer: Self-pay | Admitting: Physician Assistant

## 2022-06-24 DIAGNOSIS — F331 Major depressive disorder, recurrent, moderate: Secondary | ICD-10-CM

## 2022-06-24 DIAGNOSIS — E782 Mixed hyperlipidemia: Secondary | ICD-10-CM

## 2022-07-25 ENCOUNTER — Other Ambulatory Visit: Payer: Self-pay | Admitting: Physician Assistant

## 2022-07-25 DIAGNOSIS — F419 Anxiety disorder, unspecified: Secondary | ICD-10-CM

## 2022-07-28 ENCOUNTER — Ambulatory Visit: Payer: 59 | Admitting: Cardiology

## 2022-08-05 ENCOUNTER — Ambulatory Visit (INDEPENDENT_AMBULATORY_CARE_PROVIDER_SITE_OTHER): Payer: 59 | Admitting: Physician Assistant

## 2022-08-05 ENCOUNTER — Encounter: Payer: Self-pay | Admitting: Physician Assistant

## 2022-08-05 VITALS — BP 126/62 | HR 62 | Temp 98.7°F | Ht 64.0 in | Wt 132.0 lb

## 2022-08-05 DIAGNOSIS — J441 Chronic obstructive pulmonary disease with (acute) exacerbation: Secondary | ICD-10-CM | POA: Diagnosis not present

## 2022-08-05 MED ORDER — PREDNISONE 50 MG PO TABS: ORAL_TABLET | ORAL | 0 refills | Status: DC

## 2022-08-05 MED ORDER — AZITHROMYCIN 250 MG PO TABS: ORAL_TABLET | ORAL | 0 refills | Status: DC

## 2022-08-05 MED ORDER — BENZONATATE 200 MG PO CAPS: 200.0000 mg | ORAL_CAPSULE | Freq: Three times a day (TID) | ORAL | 0 refills | Status: DC | PRN

## 2022-08-05 MED ORDER — ALBUTEROL SULFATE HFA 108 (90 BASE) MCG/ACT IN AERS: 1.0000 | INHALATION_SPRAY | Freq: Four times a day (QID) | RESPIRATORY_TRACT | 2 refills | Status: AC | PRN

## 2022-08-05 NOTE — Patient Instructions (Signed)

## 2022-08-05 NOTE — Progress Notes (Signed)
Acute Office Visit  Subjective:     Patient ID: Rachael Douglas, female    DOB: [birth year], 66 y.o.   MRN: [id number]  Chief Complaint  Patient presents with   Sinus Problem    HPI Patient is in today for chest congestion, productive cough, sinus pressure, headache, nasal congestion and shortness of breath. Pt had quit smoking but started back. She is smoking 1/2 pack a day. No fever, chills, body aches. No covid or flu testing done. 3 weeks of symptoms. She is out of her albuterol inhaler. She is taking OTC medications with little relief.  .. Active Ambulatory Problems    Diagnosis Date Noted   Hypothyroidism 05/16/2014   Anxiety disorder 05/16/2014   Aortic heart valve narrowing 04/12/2013   Arthropathia 07/10/2008   Leg pain 12/04/2012   DDD (degenerative disc disease), lumbar 05/16/2014   Anxiety and depression 05/16/2014   Gastric catarrh 08/30/2013   Acid reflux 05/16/2014   HLD (hyperlipidemia) 05/16/2014   Arteriosclerosis of coronary artery 08/30/2013   OP (osteoporosis) 05/16/2014   Major depressive disorder, recurrent episode ([facility]) 05/16/2014   COPD, moderate ([facility]) 09/25/2014   Stricture and stenosis of esophagus 10/23/2014   Aortic valve replaced 10/23/2014   Constipation 12/17/2014   Left foot pain 02/05/2016   Diarrhea 08/01/2016   Multiple gastric ulcers 09/05/2016   Collagenous colitis 09/09/2016   Right shoulder pain 11/23/2016   Left shoulder pain 11/23/2016   Trochanteric bursitis of both hips 12/07/2016   Primary insomnia 05/16/2017   Chronic prescription benzodiazepine use 07/04/2017   Left carotid bruit 08/15/2017   Actinic keratoses 12/03/2017   Pruritus 02/21/2018   Ankle ligament laxity, right 05/28/2018   Eczema 05/30/2018   Palpable mass of lower back 05/31/2018   Chronic bilateral low back pain without sciatica 05/31/2018   Anxiety 05/31/2018   Easy bruising 09/12/2018   Family history of Alzheimer's disease 07/29/2019   Headache  02/26/2020   Closed fracture of distal end of both radius and ulna with routine healing, subsequent encounter 03/09/2021   Mild cognitive impairment 01/04/2022   At high risk for falls 01/04/2022   Psoriasis 01/21/2022   Iron deficiency anemia secondary to inadequate dietary iron intake 01/21/2022   Axillary mass, right 01/21/2022   Chronic pain syndrome 03/17/2022   Recurrent UTI 05/11/2022   Primary osteoarthritis of right knee 05/11/2022   Resolved Ambulatory Problems    Diagnosis Date Noted   Complication of surgery 11/13/2015   Failure to attend appointment 05/05/2016   Rash and nonspecific skin eruption 07/04/2017   Past Medical History:  Diagnosis Date   Arthritis    Heart disease    Hyperlipidemia    Hypertension    PUD (peptic ulcer disease)    Thyroid disease      ROS  See HPI.     Objective:    BP 126/62   Pulse 62   Temp 98.7 F (37.1 C) (Oral)   Ht 5\' 4"  (1.626 m)   Wt 132 lb (59.9 kg)   SpO2 99%   BMI 22.66 kg/m  BP Readings from Last 3 Encounters:  08/05/22 126/62  06/17/22 130/69  05/11/22 134/74   Wt Readings from Last 3 Encounters:  08/05/22 132 lb (59.9 kg)  06/17/22 130 lb (59 kg)  05/11/22 131 lb (59.4 kg)      Physical Exam Constitutional:      Appearance: Normal appearance.  HENT:     Head: Normocephalic.     Right Ear:  Tympanic membrane, ear canal and external ear normal. There is no impacted cerumen.     Left Ear: Tympanic membrane, ear canal and external ear normal. There is no impacted cerumen.     Nose: Congestion present.     Mouth/Throat:     Mouth: Mucous membranes are moist.     Pharynx: Posterior oropharyngeal erythema present.  Eyes:     Conjunctiva/sclera: Conjunctivae normal.  Neck:     Vascular: No carotid bruit.  Cardiovascular:     Rate and Rhythm: Normal rate.  Pulmonary:     Effort: Pulmonary effort is normal.     Comments: Coarse breath sounds.  Musculoskeletal:     Cervical back: Normal range of  motion and neck supple. No tenderness.     Right lower leg: No edema.     Left lower leg: No edema.  Lymphadenopathy:     Cervical: No cervical adenopathy.  Neurological:     General: No focal deficit present.     Mental Status: She is alert.  Psychiatric:        Mood and Affect: Mood normal.           Assessment & Plan:  Marland KitchenMarland KitchenViolet was seen today for sinus problem.  Diagnoses and all orders for this visit:  COPD exacerbation ([facility]) -     predniSONE (DELTASONE) 50 MG tablet; One tab PO daily for 5 days. -     azithromycin (ZITHROMAX Z-PAK) 250 MG tablet; Take 2 tablets (500 mg) on  Day 1,  followed by 1 tablet (250 mg) once daily on Days 2 through 5. -     benzonatate (TESSALON) 200 MG capsule; Take 1 capsule (200 mg total) by mouth 3 (three) times daily as needed. -     albuterol (VENTOLIN HFA) 108 (90 Base) MCG/ACT inhaler; Inhale 1-2 puffs into the lungs every 6 (six) hours as needed for wheezing or shortness of breath. INHALE 2 PUFFS EVERY 6 HOURS AS NEEDED   Vitals look good today Zpak, prednisone, tessalon pearls and albuterol given today.  Rest and hydrate. Encouraged smoking cessation. Follow up as needed or if symptoms persist or worsen.   Tandy Gaw, PA-C

## 2022-08-13 ENCOUNTER — Other Ambulatory Visit: Payer: Self-pay | Admitting: Physician Assistant

## 2022-08-13 DIAGNOSIS — I2510 Atherosclerotic heart disease of native coronary artery without angina pectoris: Secondary | ICD-10-CM

## 2022-08-25 ENCOUNTER — Encounter: Payer: Self-pay | Admitting: Family Medicine

## 2022-08-25 ENCOUNTER — Ambulatory Visit (INDEPENDENT_AMBULATORY_CARE_PROVIDER_SITE_OTHER): Payer: 59 | Admitting: Family Medicine

## 2022-08-25 VITALS — BP 139/74 | HR 73 | Temp 98.2°F | Ht 64.0 in | Wt 132.2 lb

## 2022-08-25 DIAGNOSIS — J329 Chronic sinusitis, unspecified: Secondary | ICD-10-CM

## 2022-08-25 MED ORDER — METHYLPREDNISOLONE 4 MG PO TBPK: ORAL_TABLET | ORAL | 0 refills | Status: DC

## 2022-08-25 MED ORDER — DOXYCYCLINE HYCLATE 100 MG PO TABS: 100.0000 mg | ORAL_TABLET | Freq: Two times a day (BID) | ORAL | 0 refills | Status: DC

## 2022-08-25 NOTE — Progress Notes (Signed)
Acute Office Visit  Subjective:     Patient ID: Rachael Douglas, female    DOB: 19-Feb-1956, 66 y.o.   MRN: [id number]  Chief Complaint  Patient presents with   Sinus Problem    Patient in office - c/o  continuing symptoms of sinus pressure, cough, nasal drainage , nasal congestion- seen by Tandy Gaw  on 08/05/2022 for same symptoms     HPI Patient is in today for acute visit.  Pt reports she was seen on 11/17 for URI symptoms. Diagnosed with sinusitis. She was given Z pack, Tessalon perles and albuterol inhaler. Pt reports she felt like she got better but not 100%. She says she has sore throat and ear aches that feels like they are full. She also has a cough worse at night. She uses tessalon perles which helps. She smokes but not a lot, only 1 pack every 4 days. No chest pains no SOB.   Review of Systems  Constitutional:  Negative for fever and malaise/fatigue.  HENT:  Positive for congestion, ear pain, sinus pain and sore throat.   Respiratory:  Positive for cough. Negative for shortness of breath and wheezing.   Cardiovascular:  Negative for chest pain and leg swelling.  Neurological:  Negative for dizziness.  All other systems reviewed and are negative.       Objective:    BP 139/74   Pulse 73   Temp 98.2 F (36.8 C)   Ht 5\' 4"  (1.626 m)   Wt 132 lb 4 oz (60 kg)   SpO2 99%   BMI 22.70 kg/m    Physical Exam Vitals and nursing note reviewed.  Constitutional:      Appearance: Normal appearance. She is normal weight.  HENT:     Head: Normocephalic and atraumatic.     Right Ear: Tympanic membrane, ear canal and external ear normal.     Left Ear: Tympanic membrane, ear canal and external ear normal.     Nose: Congestion and rhinorrhea present.     Mouth/Throat:     Mouth: Mucous membranes are moist.     Pharynx: Oropharynx is clear.  Eyes:     Extraocular Movements: Extraocular movements intact.     Conjunctiva/sclera: Conjunctivae normal.     Pupils:  Pupils are equal, round, and reactive to light.  Cardiovascular:     Rate and Rhythm: Normal rate and regular rhythm.     Pulses: Normal pulses.     Heart sounds: Normal heart sounds.  Pulmonary:     Effort: Pulmonary effort is normal.     Breath sounds: Normal breath sounds. No stridor. No wheezing or rhonchi.  Abdominal:     General: Abdomen is flat. Bowel sounds are normal.     Palpations: Abdomen is soft.  Musculoskeletal:     Cervical back: Normal range of motion.  Neurological:     Mental Status: She is alert.  Psychiatric:        Mood and Affect: Mood normal.        Behavior: Behavior normal.        Thought Content: Thought content normal.        Judgment: Judgment normal.    No results found for any visits on 08/25/22.      Assessment & Plan:   Problem List Items Addressed This Visit   None Visit Diagnoses     Recurrent sinusitis    -  Primary   Relevant Medications   doxycycline (VIBRA-TABS) 100 MG  tablet   methylPREDNISolone (MEDROL DOSEPAK) 4 MG TBPK tablet       Meds ordered this encounter  Medications   doxycycline (VIBRA-TABS) 100 MG tablet    Sig: Take 1 tablet (100 mg total) by mouth 2 (two) times daily.    Dispense:  14 tablet    Refill:  0   methylPREDNISolone (MEDROL DOSEPAK) 4 MG TBPK tablet    Sig: 6-day pack as directed    Dispense:  21 tablet    Refill:  0   Sinusitis, no better with Azithromycin. Will try different abx coverage with Doxycycline due to hx of COPD along with Medrol dose pack.  No follow-ups on file.  Suzan Slick, MD

## 2022-08-26 ENCOUNTER — Telehealth: Payer: Self-pay | Admitting: Neurology

## 2022-08-26 NOTE — Telephone Encounter (Signed)
Patient called and LVM stating she only received 60 oxycodone tablets this month. She called the pharmacy, but they told her they had a count of 90. She wanted to tell us that she only got 60 pills. FYI.

## 2022-09-05 ENCOUNTER — Other Ambulatory Visit: Payer: Self-pay | Admitting: Neurology

## 2022-09-05 DIAGNOSIS — S52509D Unspecified fracture of the lower end of unspecified radius, subsequent encounter for closed fracture with routine healing: Secondary | ICD-10-CM

## 2022-09-15 ENCOUNTER — Ambulatory Visit: Payer: 59 | Admitting: Physician Assistant

## 2022-09-16 ENCOUNTER — Ambulatory Visit: Payer: 59 | Admitting: Physician Assistant

## 2022-09-16 ENCOUNTER — Other Ambulatory Visit: Payer: Self-pay | Admitting: Physician Assistant

## 2022-09-16 DIAGNOSIS — M1711 Unilateral primary osteoarthritis, right knee: Secondary | ICD-10-CM

## 2022-09-16 DIAGNOSIS — M545 Low back pain, unspecified: Secondary | ICD-10-CM

## 2022-09-16 DIAGNOSIS — M5136 Other intervertebral disc degeneration, lumbar region: Secondary | ICD-10-CM

## 2022-09-16 DIAGNOSIS — G894 Chronic pain syndrome: Secondary | ICD-10-CM

## 2022-09-16 MED ORDER — OXYCODONE-ACETAMINOPHEN 10-325 MG PO TABS: 1.0000 | ORAL_TABLET | Freq: Three times a day (TID) | ORAL | 0 refills | Status: DC | PRN

## 2022-09-16 NOTE — Telephone Encounter (Signed)
Last seen 08/05/2022, had appt yesterday and no showed   Last written 08/20/2022 for one month  Please advise.

## 2022-09-16 NOTE — Telephone Encounter (Signed)
..  PDMP reviewed during this encounter. Last fill was 12/3 Will post date to 1/2 Keep appt

## 2022-09-16 NOTE — Telephone Encounter (Signed)
Patient made aware.

## 2022-09-16 NOTE — Telephone Encounter (Signed)
Patient called needs refills on oxycodone she is out has appointment scheduled with Lesly Rubenstein on 09-27-22.

## 2022-09-18 ENCOUNTER — Other Ambulatory Visit: Payer: Self-pay | Admitting: Physician Assistant

## 2022-09-18 DIAGNOSIS — F331 Major depressive disorder, recurrent, moderate: Secondary | ICD-10-CM

## 2022-09-18 DIAGNOSIS — F419 Anxiety disorder, unspecified: Secondary | ICD-10-CM

## 2022-09-18 DIAGNOSIS — E782 Mixed hyperlipidemia: Secondary | ICD-10-CM

## 2022-09-23 ENCOUNTER — Other Ambulatory Visit: Payer: Self-pay | Admitting: Physician Assistant

## 2022-09-23 ENCOUNTER — Telehealth: Payer: Self-pay | Admitting: Neurology

## 2022-09-23 DIAGNOSIS — D508 Other iron deficiency anemias: Secondary | ICD-10-CM

## 2022-09-23 DIAGNOSIS — M5136 Other intervertebral disc degeneration, lumbar region: Secondary | ICD-10-CM

## 2022-09-23 DIAGNOSIS — I2510 Atherosclerotic heart disease of native coronary artery without angina pectoris: Secondary | ICD-10-CM

## 2022-09-23 DIAGNOSIS — S52509D Unspecified fracture of the lower end of unspecified radius, subsequent encounter for closed fracture with routine healing: Secondary | ICD-10-CM

## 2022-09-23 DIAGNOSIS — E782 Mixed hyperlipidemia: Secondary | ICD-10-CM

## 2022-09-23 DIAGNOSIS — M1711 Unilateral primary osteoarthritis, right knee: Secondary | ICD-10-CM

## 2022-09-23 DIAGNOSIS — F331 Major depressive disorder, recurrent, moderate: Secondary | ICD-10-CM

## 2022-09-23 DIAGNOSIS — F419 Anxiety disorder, unspecified: Secondary | ICD-10-CM

## 2022-09-23 DIAGNOSIS — M5416 Radiculopathy, lumbar region: Secondary | ICD-10-CM

## 2022-09-23 DIAGNOSIS — K219 Gastro-esophageal reflux disease without esophagitis: Secondary | ICD-10-CM

## 2022-09-23 DIAGNOSIS — E039 Hypothyroidism, unspecified: Secondary | ICD-10-CM

## 2022-09-23 DIAGNOSIS — J0140 Acute pansinusitis, unspecified: Secondary | ICD-10-CM

## 2022-09-23 DIAGNOSIS — J441 Chronic obstructive pulmonary disease with (acute) exacerbation: Secondary | ICD-10-CM

## 2022-09-23 DIAGNOSIS — L409 Psoriasis, unspecified: Secondary | ICD-10-CM

## 2022-09-23 DIAGNOSIS — M51369 Other intervertebral disc degeneration, lumbar region without mention of lumbar back pain or lower extremity pain: Secondary | ICD-10-CM

## 2022-09-23 DIAGNOSIS — R413 Other amnesia: Secondary | ICD-10-CM

## 2022-09-23 MED ORDER — PANTOPRAZOLE SODIUM 40 MG PO TBEC: 40.0000 mg | DELAYED_RELEASE_TABLET | Freq: Every day | ORAL | 0 refills | Status: DC

## 2022-09-23 MED ORDER — METOPROLOL TARTRATE 25 MG PO TABS: 25.0000 mg | ORAL_TABLET | Freq: Two times a day (BID) | ORAL | 0 refills | Status: DC

## 2022-09-23 MED ORDER — CYCLOBENZAPRINE HCL 10 MG PO TABS: ORAL_TABLET | ORAL | 0 refills | Status: DC

## 2022-09-23 MED ORDER — HYDROXYZINE HCL 25 MG PO TABS: ORAL_TABLET | ORAL | 0 refills | Status: DC

## 2022-09-23 MED ORDER — OTEZLA 30 MG PO TABS: 1.0000 | ORAL_TABLET | Freq: Two times a day (BID) | ORAL | 0 refills | Status: DC

## 2022-09-23 MED ORDER — DULOXETINE HCL 60 MG PO CPEP: 120.0000 mg | ORAL_CAPSULE | Freq: Every day | ORAL | 0 refills | Status: DC

## 2022-09-23 MED ORDER — ARIPIPRAZOLE 10 MG PO TABS: 10.0000 mg | ORAL_TABLET | Freq: Every day | ORAL | 0 refills | Status: DC

## 2022-09-23 MED ORDER — FERROUS SULFATE 325 (65 FE) MG PO TBEC: 325.0000 mg | DELAYED_RELEASE_TABLET | Freq: Every day | ORAL | 0 refills | Status: DC

## 2022-09-23 MED ORDER — FLUTICASONE PROPIONATE 50 MCG/ACT NA SUSP: 2.0000 | Freq: Every day | NASAL | 0 refills | Status: AC

## 2022-09-23 MED ORDER — CELECOXIB 200 MG PO CAPS: ORAL_CAPSULE | ORAL | 0 refills | Status: DC

## 2022-09-23 MED ORDER — POTASSIUM CHLORIDE ER 10 MEQ PO TBCR: 10.0000 meq | EXTENDED_RELEASE_TABLET | Freq: Every day | ORAL | 0 refills | Status: DC

## 2022-09-23 MED ORDER — ONDANSETRON 4 MG PO TBDP: 4.0000 mg | ORAL_TABLET | Freq: Three times a day (TID) | ORAL | 0 refills | Status: AC | PRN

## 2022-09-23 MED ORDER — LEVOTHYROXINE SODIUM 75 MCG PO TABS: ORAL_TABLET | ORAL | 0 refills | Status: DC

## 2022-09-23 MED ORDER — GABAPENTIN 600 MG PO TABS: 1200.0000 mg | ORAL_TABLET | Freq: Three times a day (TID) | ORAL | 0 refills | Status: DC

## 2022-09-23 MED ORDER — FAMOTIDINE 40 MG PO TABS: ORAL_TABLET | ORAL | 0 refills | Status: DC

## 2022-09-23 MED ORDER — DONEPEZIL HCL 10 MG PO TABS: ORAL_TABLET | ORAL | 0 refills | Status: DC

## 2022-09-23 MED ORDER — BUSPIRONE HCL 10 MG PO TABS: ORAL_TABLET | ORAL | 0 refills | Status: DC

## 2022-09-23 MED ORDER — FUROSEMIDE 40 MG PO TABS: ORAL_TABLET | ORAL | 0 refills | Status: DC

## 2022-09-23 MED ORDER — PRAVASTATIN SODIUM 80 MG PO TABS: ORAL_TABLET | ORAL | 0 refills | Status: DC

## 2022-09-23 MED ORDER — DICLOFENAC SODIUM 1 % EX GEL: 4.0000 g | Freq: Four times a day (QID) | CUTANEOUS | 0 refills | Status: DC
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
## 2022-09-26 NOTE — Telephone Encounter (Signed)
Oxycodone was last pick up on 1/2 from pharmacy. Will not fill until 10/20/2022.

## 2022-09-27 ENCOUNTER — Encounter: Payer: Self-pay | Admitting: Physician Assistant

## 2022-09-27 ENCOUNTER — Ambulatory Visit (INDEPENDENT_AMBULATORY_CARE_PROVIDER_SITE_OTHER): Payer: 59 | Admitting: Physician Assistant

## 2022-09-27 ENCOUNTER — Telehealth: Payer: Self-pay | Admitting: Neurology

## 2022-09-27 VITALS — BP 135/68 | HR 107 | Ht 64.0 in | Wt 130.0 lb

## 2022-09-27 DIAGNOSIS — G894 Chronic pain syndrome: Secondary | ICD-10-CM | POA: Diagnosis not present

## 2022-09-27 DIAGNOSIS — F5101 Primary insomnia: Secondary | ICD-10-CM | POA: Diagnosis not present

## 2022-09-27 DIAGNOSIS — F411 Generalized anxiety disorder: Secondary | ICD-10-CM

## 2022-09-27 DIAGNOSIS — F331 Major depressive disorder, recurrent, moderate: Secondary | ICD-10-CM

## 2022-09-27 DIAGNOSIS — M5136 Other intervertebral disc degeneration, lumbar region: Secondary | ICD-10-CM

## 2022-09-27 DIAGNOSIS — F430 Acute stress reaction: Secondary | ICD-10-CM | POA: Diagnosis not present

## 2022-09-27 DIAGNOSIS — G8929 Other chronic pain: Secondary | ICD-10-CM

## 2022-09-27 DIAGNOSIS — M1711 Unilateral primary osteoarthritis, right knee: Secondary | ICD-10-CM

## 2022-09-27 DIAGNOSIS — M545 Low back pain, unspecified: Secondary | ICD-10-CM

## 2022-09-27 MED ORDER — DULOXETINE HCL 60 MG PO CPEP: 120.0000 mg | ORAL_CAPSULE | Freq: Every day | ORAL | 0 refills | Status: DC

## 2022-09-27 MED ORDER — MIRTAZAPINE 7.5 MG PO TABS: ORAL_TABLET | ORAL | 0 refills | Status: DC

## 2022-09-27 MED ORDER — BUSPIRONE HCL 15 MG PO TABS: 15.0000 mg | ORAL_TABLET | Freq: Three times a day (TID) | ORAL | 1 refills | Status: DC

## 2022-09-27 MED ORDER — OXYCODONE-ACETAMINOPHEN 10-325 MG PO TABS: 1.0000 | ORAL_TABLET | Freq: Three times a day (TID) | ORAL | 0 refills | Status: DC | PRN
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
## 2022-09-27 NOTE — Patient Instructions (Signed)
Start remeron at bedtime for sleep Increase buspar to 15mg  three times a day

## 2022-09-27 NOTE — Telephone Encounter (Signed)
Received phone call from [facility] wanting updated office notes. Recent notes faxed to them at [phone].

## 2022-09-28 ENCOUNTER — Encounter: Payer: Self-pay | Admitting: Physician Assistant

## 2022-09-28 ENCOUNTER — Telehealth: Payer: Self-pay | Admitting: Physician Assistant

## 2022-09-28 DIAGNOSIS — F430 Acute stress reaction: Secondary | ICD-10-CM | POA: Insufficient documentation

## 2022-09-28 DIAGNOSIS — R339 Retention of urine, unspecified: Secondary | ICD-10-CM | POA: Insufficient documentation

## 2022-10-03 ENCOUNTER — Other Ambulatory Visit: Payer: Self-pay | Admitting: Physician Assistant

## 2022-10-07 ENCOUNTER — Other Ambulatory Visit: Payer: Self-pay | Admitting: Physician Assistant

## 2022-10-07 DIAGNOSIS — F331 Major depressive disorder, recurrent, moderate: Secondary | ICD-10-CM

## 2022-10-11 ENCOUNTER — Telehealth: Payer: Self-pay

## 2022-10-11 DIAGNOSIS — F5101 Primary insomnia: Secondary | ICD-10-CM

## 2022-10-11 NOTE — Telephone Encounter (Signed)
Rachael Douglas called and left a message stating the sleep medication is not working. She is requesting to go back on the Ambien.

## 2022-10-12 MED ORDER — ZOLPIDEM TARTRATE 10 MG PO TABS: 10.0000 mg | ORAL_TABLET | Freq: Every evening | ORAL | 1 refills | Status: DC | PRN

## 2022-10-12 NOTE — Addendum Note (Signed)
Addended by: Donella Stade on: 10/12/2022 03:16 PM   Modules accepted: Orders
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
## 2022-10-12 NOTE — Telephone Encounter (Signed)
Sent ambien.

## 2022-10-13 NOTE — Telephone Encounter (Signed)
Patient is aware.

## 2022-10-19 ENCOUNTER — Telehealth: Payer: Self-pay

## 2022-10-19 ENCOUNTER — Other Ambulatory Visit: Payer: Self-pay | Admitting: Physician Assistant

## 2022-10-19 DIAGNOSIS — F411 Generalized anxiety disorder: Secondary | ICD-10-CM

## 2022-10-19 DIAGNOSIS — S52509D Unspecified fracture of the lower end of unspecified radius, subsequent encounter for closed fracture with routine healing: Secondary | ICD-10-CM

## 2022-10-19 DIAGNOSIS — F5101 Primary insomnia: Secondary | ICD-10-CM

## 2022-10-19 DIAGNOSIS — F430 Acute stress reaction: Secondary | ICD-10-CM

## 2022-10-19 DIAGNOSIS — D508 Other iron deficiency anemias: Secondary | ICD-10-CM

## 2022-10-19 NOTE — Telephone Encounter (Signed)
Sent MyChart message to patient for an updated phone number.

## 2022-10-21 ENCOUNTER — Other Ambulatory Visit: Payer: Self-pay | Admitting: Neurology

## 2022-10-21 DIAGNOSIS — I2510 Atherosclerotic heart disease of native coronary artery without angina pectoris: Secondary | ICD-10-CM

## 2022-10-21 MED ORDER — METOPROLOL TARTRATE 25 MG PO TABS: 25.0000 mg | ORAL_TABLET | Freq: Two times a day (BID) | ORAL | 0 refills | Status: DC

## 2022-10-22 ENCOUNTER — Encounter: Payer: Self-pay | Admitting: Emergency Medicine

## 2022-10-22 ENCOUNTER — Ambulatory Visit
Admission: EM | Admit: 2022-10-22 | Discharge: 2022-10-22 | Disposition: A | Discharge status: Home / Self Care | Payer: 59 | Attending: Family Medicine | Admitting: Family Medicine

## 2022-10-22 DIAGNOSIS — J209 Acute bronchitis, unspecified: Secondary | ICD-10-CM | POA: Diagnosis not present

## 2022-10-22 DIAGNOSIS — J449 Chronic obstructive pulmonary disease, unspecified: Secondary | ICD-10-CM

## 2022-10-22 MED ORDER — DOXYCYCLINE HYCLATE 100 MG PO CAPS: 100.0000 mg | ORAL_CAPSULE | Freq: Two times a day (BID) | ORAL | 0 refills | Status: DC

## 2022-10-22 MED ORDER — BENZONATATE 200 MG PO CAPS: 200.0000 mg | ORAL_CAPSULE | Freq: Two times a day (BID) | ORAL | 0 refills | Status: DC | PRN

## 2022-10-22 NOTE — Discharge Instructions (Signed)
Take doxycycline 2 times a day It is important to take doxycycline with food Drink lots of water Take the Tessalon Perles 2 times a day May take in addition Mucinex DM if needed for cough and to loosen mucus See your doctor if not improving by next week

## 2022-10-22 NOTE — ED Triage Notes (Signed)
Patient c/o productive cough x 1 week and congestion.  Patient has taken Benadryl this am.

## 2022-10-22 NOTE — ED Provider Notes (Signed)
[facility]    CSN: [id number] Arrival date & time: 10/22/22  1258      History   Chief Complaint Chief Complaint  Patient presents with   Cough    HPI Rachael Douglas is a 67 y.o. female.   HPI  Patient is a 67 year old smoker with asthma, heart COPD who is here for a cough.  She has fragile health.  Has heart disease hyperlipidemia hypertension mild cognitive impairment.  Here with fatigue and cough.  It has been going on for a week.  She is coughing up purulent sputum.  Otitis poor.  She is brought in by family.  History of cognitive impairment. Polypharmacy  Past Medical History:  Diagnosis Date   Arthritis    Heart disease    Hyperlipidemia    Hypertension    PUD (peptic ulcer disease)    Thyroid disease     Patient Active Problem List   Diagnosis Date Noted   Acute stress reaction 09/28/2022   Urinary retention 09/28/2022   Recurrent UTI 05/11/2022   Primary osteoarthritis of right knee 05/11/2022   Chronic pain syndrome 03/17/2022   Psoriasis 01/21/2022   Iron deficiency anemia secondary to inadequate dietary iron intake 01/21/2022   Axillary mass, right 01/21/2022   Mild cognitive impairment 01/04/2022   At high risk for falls 01/04/2022   Closed fracture of distal end of both radius and ulna with routine healing, subsequent encounter 03/09/2021   Family history of Alzheimer's disease 07/29/2019   Easy bruising 09/12/2018   Palpable mass of lower back 05/31/2018   Chronic bilateral low back pain without sciatica 05/31/2018   Eczema 05/30/2018   Pruritus 02/21/2018   Actinic keratoses 12/03/2017   Left carotid bruit 08/15/2017   Chronic prescription benzodiazepine use 07/04/2017   Primary insomnia 05/16/2017   Collagenous colitis 09/09/2016   Multiple gastric ulcers 09/05/2016   Constipation 12/17/2014   Stricture and stenosis of esophagus 10/23/2014   Aortic valve replaced 10/23/2014   COPD, moderate ([place]) 09/25/2014   Hypothyroidism  05/16/2014   Anxiety disorder 05/16/2014   DDD (degenerative disc disease), lumbar 05/16/2014   Acid reflux 05/16/2014   HLD (hyperlipidemia) 05/16/2014   OP (osteoporosis) 05/16/2014   Major depressive disorder, recurrent episode ([place]) 05/16/2014   Arteriosclerosis of coronary artery 08/30/2013   Aortic heart valve narrowing 04/12/2013    Past Surgical History:  Procedure Laterality Date   AORTIC VALVE REPLACEMENT     TOTAL ABDOMINAL HYSTERECTOMY      OB History   No obstetric history on file.      Home Medications    Prior to Admission medications   Medication Sig Start Date End Date Taking? Authorizing Provider  albuterol (VENTOLIN HFA) 108 (90 Base) MCG/ACT inhaler Inhale 1-2 puffs into the lungs every 6 (six) hours as needed for wheezing or shortness of breath. INHALE 2 PUFFS EVERY 6 HOURS AS NEEDED 08/05/22  Yes Breeback, Jade L, PA-C  AMBULATORY NON FORMULARY MEDICATION In and out catheter and supplies to self catherize every night. 10/05/21  Yes Breeback, Jade L, PA-C  AMBULATORY NON FORMULARY MEDICATION Rollator with seat. 10/19/21  Yes Breeback, Jade L, PA-C  ARIPiprazole (ABILIFY) 10 MG tablet Take 1 tablet (10 mg total) by mouth daily. 09/23/22  Yes Breeback, Jade L, PA-C  benzonatate (TESSALON) 200 MG capsule Take 1 capsule (200 mg total) by mouth 2 (two) times daily as needed for cough. 10/22/22  Yes Raylene Everts, MD  busPIRone (BUSPAR) 15 MG tablet  Take 1 tablet (15 mg total) by mouth 3 (three) times daily. 09/27/22  Yes Breeback, Jade L, PA-C  celecoxib (CELEBREX) 200 MG capsule One to 2 tablets by mouth daily as needed for pain. 09/23/22  Yes Breeback, Jade L, PA-C  clobetasol ointment (TEMOVATE) 0.05 % Apply topically. 03/17/22  Yes [provider]  cyclobenzaprine (FLEXERIL) 10 MG tablet TAKE 1 TABLET(10 MG) BY MOUTH THREE TIMES DAILY 09/23/22  Yes Breeback, Jade L, PA-C  diclofenac Sodium (VOLTAREN) 1 % GEL Apply 4 g topically 4 (four) times daily. To  affected joint. 09/23/22  Yes Breeback, Jade L, PA-C  donepezil (ARICEPT) 10 MG tablet TAKE 1 TABLET(10 MG) BY MOUTH AT BEDTIME 09/23/22  Yes Breeback, Jade L, PA-C  doxycycline (VIBRAMYCIN) 100 MG capsule Take 1 capsule (100 mg total) by mouth 2 (two) times daily. 10/22/22  Yes Eustace Moore, MD  DULoxetine (CYMBALTA) 60 MG capsule Take 2 capsules (120 mg total) by mouth daily. 09/27/22  Yes Breeback, Jade L, PA-C  famotidine (PEPCID) 40 MG tablet TAKE 1 TABLET(40 MG) BY MOUTH AT BEDTIME 10/19/22  Yes Breeback, Jade L, PA-C  ferrous sulfate 325 (65 FE) MG EC tablet TAKE 1 TABLET(325 MG) BY MOUTH DAILY WITH BREAKFAST 10/19/22  Yes Breeback, Jade L, PA-C  fluticasone (FLONASE) 50 MCG/ACT nasal spray Place 2 sprays into both nostrils daily. 09/23/22  Yes Breeback, Jade L, PA-C  furosemide (LASIX) 40 MG tablet TAKE 1 TABLET(40 MG) BY MOUTH DAILY AS NEEDED 09/23/22  Yes Breeback, Jade L, PA-C  gabapentin (NEURONTIN) 600 MG tablet Take 2 tablets (1,200 mg total) by mouth 3 (three) times daily. 09/23/22  Yes Breeback, Jade L, PA-C  hydrOXYzine (ATARAX) 25 MG tablet TAKE 1 TABLET(25 MG) BY MOUTH EVERY 8 HOURS AS NEEDED FOR ANXIETY 09/23/22  Yes Breeback, Jade L, PA-C  levothyroxine (SYNTHROID) 75 MCG tablet TAKE 1 TABLET(75 MCG) BY MOUTH DAILY BEFORE BREAKFAST 09/23/22  Yes Breeback, Jade L, PA-C  LINZESS 290 MCG CAPS capsule TAKE 1 CAPSULE(290 MCG) BY MOUTH DAILY BEFORE BREAKFAST 10/03/22  Yes Breeback, Jade L, PA-C  metoprolol tartrate (LOPRESSOR) 25 MG tablet Take 1 tablet (25 mg total) by mouth 2 (two) times daily. 10/21/22  Yes Breeback, Jade L, PA-C  mirtazapine (REMERON) 7.5 MG tablet TAKE 1 TO 2 TABLETS BY MOUTH AT BEDTIME FOR SLEEP 10/19/22  Yes Breeback, Jade L, PA-C  naloxone (NARCAN) nasal spray 4 mg/0.1 mL USE 1 SPRAY INTRANASALLY EVERY 2-3 MINUTES UNTIL EMERGENCY TEAM HAS ARRIVED. USE IN OPIOD EMERGENCY ONLY AS DIRECTED. 10/30/20  Yes [provider]  ondansetron (ZOFRAN ODT) 4 MG disintegrating tablet  Take 1 tablet (4 mg total) by mouth every 8 (eight) hours as needed for nausea or vomiting. 09/23/22  Yes Breeback, Jade L, PA-C  OTEZLA 30 MG TABS Take 1 tablet (30 mg total) by mouth 2 (two) times daily. 09/23/22  Yes Breeback, Jade L, PA-C  oxyCODONE-acetaminophen (PERCOCET) 10-325 MG tablet Take 1 tablet by mouth every 8 (eight) hours as needed for pain. 09/27/22  Yes Breeback, Jade L, PA-C  pantoprazole (PROTONIX) 40 MG tablet Take 1 tablet (40 mg total) by mouth daily. 09/23/22  Yes Breeback, Jade L, PA-C  potassium chloride (KLOR-CON) 10 MEQ tablet Take 1 tablet (10 mEq total) by mouth daily. 09/23/22  Yes Breeback, Jade L, PA-C  pravastatin (PRAVACHOL) 80 MG tablet TAKE 1 TABLET(80 MG) BY MOUTH DAILY 09/23/22  Yes Breeback, Jade L, PA-C  SKYRIZI PEN 150 MG/ML SOAJ Inject into the skin. 05/20/22  Yes [provider]  zolpidem (AMBIEN) 10 MG tablet Take 1 tablet (10 mg total) by mouth at bedtime as needed for sleep. 10/12/22  Yes Breeback, Royetta Car, PA-C    Family History Family History  Problem Relation Age of Onset   Cancer Other    Depression Other    Diabetes Other    Hypertension Other    Breast cancer Other     Social History Social History   Tobacco Use   Smoking status: Some Days    Types: Cigarettes, E-cigarettes    Last attempt to quit: 05/30/2014    Years since quitting: 8.4   Smokeless tobacco: Never  Vaping Use   Vaping Use: Some days   Substances: Nicotine  Substance Use Topics   Alcohol use: No    Alcohol/week: 0.0 standard drinks of alcohol   Drug use: No     Allergies   Penicillins, Ranitidine, Alendronate, and Bisphosphonates   Review of Systems Review of Systems See HPI  Physical Exam Triage Vital Signs ED Triage Vitals  Enc Vitals Group     BP 10/22/22 1333 111/70     Pulse Rate 10/22/22 1333 67     Resp 10/22/22 1333 18     Temp 10/22/22 1333 98.8 F (37.1 C)     Temp Source 10/22/22 1333 Oral     SpO2 10/22/22 1333 97 %     Weight 10/22/22  1334 125 lb (56.7 kg)     Height 10/22/22 1334 5\' 4"  (1.626 m)     Head Circumference --      Peak Flow --      Pain Score 10/22/22 1334 8     Pain Loc --      Pain Edu? --      Excl. in [place]? --    No data found.  Updated Vital Signs BP 111/70 (BP Location: Left Arm)   Pulse 67   Temp 98.8 F (37.1 C) (Oral)   Resp 18   Ht 5\' 4"  (1.626 m)   Wt 56.7 kg   SpO2 97%   BMI 21.46 kg/m      Physical Exam Constitutional:      General: She is not in acute distress.    Appearance: She is well-developed. She is ill-appearing.     Comments: Appears older than stated age.  Frail  HENT:     Head: Normocephalic and atraumatic.     Nose: No congestion or rhinorrhea.     Mouth/Throat:     Pharynx: No posterior oropharyngeal erythema.     Comments: Dry mucous membranes.  Dentures Eyes:     Conjunctiva/sclera: Conjunctivae normal.     Pupils: Pupils are equal, round, and reactive to light.  Cardiovascular:     Rate and Rhythm: Normal rate and regular rhythm.     Heart sounds: Murmur heard.  Pulmonary:     Effort: Pulmonary effort is normal. No respiratory distress.     Breath sounds: No rhonchi.     Comments: Decreased breath sound.  Scattered rhonchi Abdominal:     General: There is no distension.     Palpations: Abdomen is soft.  Musculoskeletal:        General: Normal range of motion.     Cervical back: Normal range of motion.  Lymphadenopathy:     Cervical: No cervical adenopathy.  Skin:    General: Skin is warm and dry.  Neurological:     Mental Status: She is alert.  Gait: Gait abnormal.      UC Treatments / Results  Labs (all labs ordered are listed, but only abnormal results are displayed) Labs Reviewed - No data to display  EKG   Radiology No results found.  Procedures Procedures (including critical care time)  Medications Ordered in UC Medications - No data to display  Initial Impression / Assessment and Plan / UC Course  I have reviewed the  triage vital signs and the nursing notes.  Pertinent labs & imaging results that were available during my care of the patient were reviewed by me and considered in my medical decision making (see chart for details).     Final Clinical Impressions(s) / UC Diagnoses   Final diagnoses:  Acute bronchitis, unspecified organism  Chronic obstructive pulmonary disease, unspecified COPD type ([place])     Discharge Instructions      Take doxycycline 2 times a day It is important to take doxycycline with food Drink lots of water Take the Tessalon Perles 2 times a day May take in addition Mucinex DM if needed for cough and to loosen mucus See your doctor if not improving by next week    ED Prescriptions     Medication Sig Dispense Auth. Provider   doxycycline (VIBRAMYCIN) 100 MG capsule Take 1 capsule (100 mg total) by mouth 2 (two) times daily. 20 capsule Raylene Everts, MD   benzonatate (TESSALON) 200 MG capsule Take 1 capsule (200 mg total) by mouth 2 (two) times daily as needed for cough. 20 capsule Raylene Everts, MD      I have reviewed the PDMP during this encounter.   Raylene Everts, MD 10/22/22 [phone]

## 2022-10-26 ENCOUNTER — Other Ambulatory Visit: Payer: Self-pay

## 2022-10-26 DIAGNOSIS — M5416 Radiculopathy, lumbar region: Secondary | ICD-10-CM

## 2022-10-26 DIAGNOSIS — K219 Gastro-esophageal reflux disease without esophagitis: Secondary | ICD-10-CM

## 2022-10-26 DIAGNOSIS — E782 Mixed hyperlipidemia: Secondary | ICD-10-CM

## 2022-10-26 DIAGNOSIS — G3184 Mild cognitive impairment, so stated: Secondary | ICD-10-CM

## 2022-10-26 DIAGNOSIS — R609 Edema, unspecified: Secondary | ICD-10-CM

## 2022-10-26 MED ORDER — FUROSEMIDE 40 MG PO TABS: ORAL_TABLET | ORAL | 0 refills | Status: DC

## 2022-10-26 MED ORDER — PANTOPRAZOLE SODIUM 40 MG PO TBEC: 40.0000 mg | DELAYED_RELEASE_TABLET | Freq: Every day | ORAL | 0 refills | Status: DC

## 2022-10-26 MED ORDER — PRAVASTATIN SODIUM 80 MG PO TABS: ORAL_TABLET | ORAL | 0 refills | Status: DC

## 2022-10-28 NOTE — Telephone Encounter (Signed)
Error

## 2022-10-31 ENCOUNTER — Other Ambulatory Visit: Payer: Self-pay | Admitting: Neurology

## 2022-10-31 DIAGNOSIS — M5416 Radiculopathy, lumbar region: Secondary | ICD-10-CM

## 2022-10-31 DIAGNOSIS — M1711 Unilateral primary osteoarthritis, right knee: Secondary | ICD-10-CM

## 2022-10-31 DIAGNOSIS — G894 Chronic pain syndrome: Secondary | ICD-10-CM

## 2022-10-31 DIAGNOSIS — G8929 Other chronic pain: Secondary | ICD-10-CM

## 2022-10-31 DIAGNOSIS — M545 Low back pain, unspecified: Secondary | ICD-10-CM

## 2022-10-31 DIAGNOSIS — M51369 Other intervertebral disc degeneration, lumbar region without mention of lumbar back pain or lower extremity pain: Secondary | ICD-10-CM

## 2022-10-31 DIAGNOSIS — M5136 Other intervertebral disc degeneration, lumbar region: Secondary | ICD-10-CM

## 2022-10-31 MED ORDER — OXYCODONE-ACETAMINOPHEN 10-325 MG PO TABS: 1.0000 | ORAL_TABLET | Freq: Three times a day (TID) | ORAL | 0 refills | Status: DC | PRN

## 2022-10-31 MED ORDER — GABAPENTIN 600 MG PO TABS: 1200.0000 mg | ORAL_TABLET | Freq: Three times a day (TID) | ORAL | 1 refills | Status: DC

## 2022-10-31 NOTE — Telephone Encounter (Signed)
Patient called and asked for refills closer to her new home - [place] written 09/27/2022 #90 with no refills Last appt 09/27/2022

## 2022-11-08 ENCOUNTER — Ambulatory Visit: Payer: 59 | Admitting: Physician Assistant

## 2022-11-14 ENCOUNTER — Telehealth: Payer: Self-pay | Admitting: Physician Assistant

## 2022-11-14 ENCOUNTER — Telehealth: Payer: Self-pay | Admitting: General Practice

## 2022-11-14 DIAGNOSIS — Z79899 Other long term (current) drug therapy: Secondary | ICD-10-CM

## 2022-11-14 NOTE — Telephone Encounter (Signed)
Sister called she is her caretaker she gives patient her meds and is concerned that she is not given her the right ones. She asked for a current list of medications she would like a call back to clarify her phone numbers are [phone] (h) [phone] (c)

## 2022-11-14 NOTE — Telephone Encounter (Signed)
No, she does not have a DPR signed.  Tvt

## 2022-11-14 NOTE — Telephone Encounter (Signed)
Yes, she signed a HIPAA notice 01/2022. tvt

## 2022-11-14 NOTE — Transitions of Care (Post Inpatient/ED Visit) (Signed)
   11/14/2022  Name: Rachael Douglas MRN: [id number] DOB: April 13, 1956  Today's TOC FU Call Status: Today's TOC FU Call Status:: Successful TOC FU Call Competed TOC FU Call Complete Date: 11/14/22  Transition Care Management Follow-up Telephone Call Date of Discharge: 11/13/22 Discharge Facility: Other ([facility]) Name of Other (Non[facility]) Discharge Facility: [facility] Type of Discharge: Emergency Department Reason for ED Visit: Neurologic Neurologic Diagnosis:  (confusion) How have you been since you were released from the hospital?: Same (her sister reports that patient is doing worse; they are back in the ER while she was on the phone) Any questions or concerns?: No  Items Reviewed: Did you receive and understand the discharge instructions provided?: No Medications obtained and verified?: No Medications Not Reviewed Reasons:: Other: (on the phone with her sister; she did not have the medications with her at the time since they were in the emergency room) Any new allergies since your discharge?: No Dietary orders reviewed?: No Do you have support at home?: Yes People in Home: child(ren), adult, sibling(s)  Home Care and Equipment/Supplies: [place] Ordered?: No Any new equipment or medical supplies ordered?: No  Functional Questionnaire: Do you need assistance with bathing/showering or dressing?: No Do you need assistance with meal preparation?: No Do you need assistance with eating?: No Do you have difficulty maintaining continence: No Do you need assistance with getting out of bed/getting out of a chair/moving?: No Do you have difficulty managing or taking your medications?: No  Folllow up appointments reviewed: PCP Follow-up appointment confirmed?: Yes Date of PCP follow-up appointment?: 11/18/22 Follow-up Provider: Iran Planas, [facility] Follow-up appointment confirmed?: NA Do you need transportation to your follow-up appointment?:  No Do you understand care options if your condition(s) worsen?: Yes-patient verbalized understanding    SIGNATURE: Tinnie Gens, RN BSN

## 2022-11-14 NOTE — Telephone Encounter (Signed)
Is there a HIPAA release on file for them? Can't release medication information without a HIPAA release on file.

## 2022-11-14 NOTE — Telephone Encounter (Signed)
I would like for patient to meet with our pharmacist and go over medications. It can be in person or virtual. Rachael Douglas will you make referral [phone] for [facility].

## 2022-11-14 NOTE — Telephone Encounter (Signed)
If this person is not on DPR then I can't speak to them and give any medication information. The patient could come sign one or schedule an appt and bring this new caregiver to go over medications. Please let her know.

## 2022-11-15 NOTE — Telephone Encounter (Signed)
Referral placed.

## 2022-11-15 NOTE — Addendum Note (Signed)
Addended by: Annamaria Helling on: 11/15/2022 08:31 AM   Modules accepted: Orders

## 2022-11-16 ENCOUNTER — Encounter: Payer: Self-pay | Admitting: Physician Assistant

## 2022-11-16 ENCOUNTER — Ambulatory Visit (INDEPENDENT_AMBULATORY_CARE_PROVIDER_SITE_OTHER): Payer: 59 | Admitting: Physician Assistant

## 2022-11-16 ENCOUNTER — Other Ambulatory Visit: Payer: Self-pay | Admitting: Physician Assistant

## 2022-11-16 VITALS — BP 127/59 | HR 63 | Ht 64.0 in | Wt 129.0 lb

## 2022-11-16 DIAGNOSIS — F411 Generalized anxiety disorder: Secondary | ICD-10-CM

## 2022-11-16 DIAGNOSIS — F5101 Primary insomnia: Secondary | ICD-10-CM

## 2022-11-16 DIAGNOSIS — R319 Hematuria, unspecified: Secondary | ICD-10-CM

## 2022-11-16 DIAGNOSIS — F331 Major depressive disorder, recurrent, moderate: Secondary | ICD-10-CM | POA: Diagnosis not present

## 2022-11-16 DIAGNOSIS — Z79899 Other long term (current) drug therapy: Secondary | ICD-10-CM

## 2022-11-16 DIAGNOSIS — Z09 Encounter for follow-up examination after completed treatment for conditions other than malignant neoplasm: Secondary | ICD-10-CM

## 2022-11-16 DIAGNOSIS — G8929 Other chronic pain: Secondary | ICD-10-CM

## 2022-11-16 DIAGNOSIS — M545 Low back pain, unspecified: Secondary | ICD-10-CM

## 2022-11-16 DIAGNOSIS — M1711 Unilateral primary osteoarthritis, right knee: Secondary | ICD-10-CM

## 2022-11-16 DIAGNOSIS — R443 Hallucinations, unspecified: Secondary | ICD-10-CM | POA: Diagnosis not present

## 2022-11-16 DIAGNOSIS — G894 Chronic pain syndrome: Secondary | ICD-10-CM

## 2022-11-16 DIAGNOSIS — F419 Anxiety disorder, unspecified: Secondary | ICD-10-CM

## 2022-11-16 DIAGNOSIS — G3184 Mild cognitive impairment, so stated: Secondary | ICD-10-CM

## 2022-11-16 DIAGNOSIS — R410 Disorientation, unspecified: Secondary | ICD-10-CM

## 2022-11-16 MED ORDER — CYCLOBENZAPRINE HCL 10 MG PO TABS: ORAL_TABLET | ORAL | 0 refills | Status: DC

## 2022-11-16 MED ORDER — HYDROXYZINE HCL 25 MG PO TABS: ORAL_TABLET | ORAL | 0 refills | Status: DC

## 2022-11-16 MED ORDER — ARIPIPRAZOLE 10 MG PO TABS: 10.0000 mg | ORAL_TABLET | Freq: Every day | ORAL | 0 refills | Status: DC

## 2022-11-16 NOTE — Patient Instructions (Signed)
Make medication changes Pharmacist will call you Referral to neurology will call you

## 2022-11-16 NOTE — Progress Notes (Signed)
Established Patient Office Visit  Subjective   Patient ID: Rachael Douglas, female    DOB: 02-24-1956  Age: 67 y.o. MRN: [id number]  Chief Complaint  Patient presents with   Hospitalization Follow-up    HPI Pt is a 67 yo female who presents to the clinic for hospital follow up for altered mental status and hallucinations. She presents to the clinic with her sister. Work up was negative CT and CTA of head, MRI , ammonia, electrolytes, eKG,  EEG, with tox screen positive for cannabis. She was treated for encephalopathy empirically for Thiamine.  Concern for encephalopathy vs dementia. She did take unknown gummy from her daughter that suspect to cause acute altered mental status.   She has been improving but sister and family and concerned about medications and would like to go over them. There is some concern that patient's daughter is taking some of her controlled substances. Pt does have chronic pain but she is only using oxycodone every so often, not daily, for pain.     Patient Active Problem List   Diagnosis Date Noted   Hallucinations 11/16/2022   Delirium 11/16/2022   Acute stress reaction 09/28/2022   Urinary retention 09/28/2022   Recurrent UTI 05/11/2022   Primary osteoarthritis of right knee 05/11/2022   Chronic pain syndrome 03/17/2022   Psoriasis 01/21/2022   Iron deficiency anemia secondary to inadequate dietary iron intake 01/21/2022   Axillary mass, right 01/21/2022   Mild cognitive impairment 01/04/2022   At high risk for falls 01/04/2022   Closed fracture of distal end of both radius and ulna with routine healing, subsequent encounter 03/09/2021   Family history of Alzheimer's disease 07/29/2019   Easy bruising 09/12/2018   Palpable mass of lower back 05/31/2018   Chronic bilateral low back pain without sciatica 05/31/2018   Eczema 05/30/2018   Pruritus 02/21/2018   Actinic keratoses 12/03/2017   Left carotid bruit 08/15/2017   Chronic prescription  benzodiazepine use 07/04/2017   Primary insomnia 05/16/2017   Collagenous colitis 09/09/2016   Multiple gastric ulcers 09/05/2016   Constipation 12/17/2014   Stricture and stenosis of esophagus 10/23/2014   Aortic valve replaced 10/23/2014   COPD, moderate ([place]) 09/25/2014   Hypothyroidism 05/16/2014   Anxiety disorder 05/16/2014   DDD (degenerative disc disease), lumbar 05/16/2014   Acid reflux 05/16/2014   HLD (hyperlipidemia) 05/16/2014   OP (osteoporosis) 05/16/2014   Major depressive disorder, recurrent episode ([place]) 05/16/2014   Arteriosclerosis of coronary artery 08/30/2013   Aortic heart valve narrowing 04/12/2013   Past Medical History:  Diagnosis Date   Arthritis    Heart disease    Hyperlipidemia    Hypertension    PUD (peptic ulcer disease)    Thyroid disease    Family History  Problem Relation Age of Onset   Cancer Other    Depression Other    Diabetes Other    Hypertension Other    Breast cancer Other    Allergies  Allergen Reactions   Penicillins Swelling   Ranitidine Other (See Comments)    Facial swelling   Alendronate Other (See Comments)    unknown   Bisphosphonates     Nausea/stomach pains.       ROS   See HPI.  Objective:     BP (!) 127/59   Pulse 63   Ht '5\' 4"'$  (1.626 m)   Wt 129 lb (58.5 kg)   SpO2 100%   BMI 22.14 kg/m  BP Readings from Last 3 Encounters:  11/23/22 (!) 119/55  11/16/22 (!) 127/59  10/22/22 111/70   Wt Readings from Last 3 Encounters:  11/23/22 127 lb (57.6 kg)  11/16/22 129 lb (58.5 kg)  10/22/22 125 lb (56.7 kg)      Physical Exam Constitutional:      Appearance: Normal appearance.  HENT:     Head: Normocephalic.  Cardiovascular:     Rate and Rhythm: Normal rate and regular rhythm.     Heart sounds: Murmur heard.  Pulmonary:     Effort: Pulmonary effort is normal.     Breath sounds: Normal breath sounds.  Musculoskeletal:     Right lower leg: No edema.     Left lower leg: No edema.   Neurological:     General: No focal deficit present.     Mental Status: She is alert and oriented to person, place, and time.  Psychiatric:        Mood and Affect: Mood normal.    ..    03/07/2022    1:20 PM 03/01/2021    1:30 PM  6CIT Screen  What Year? 0 points 0 points  What month? 0 points 0 points  What time? 0 points 0 points  Count back from 20 0 points 0 points  Months in reverse 0 points 2 points  Repeat phrase 2 points 0 points  Total Score 2 points 2 points    .[name]    01/04/2022    3:49 PM 07/24/2019   11:09 AM  Montreal Cognitive Assessment   Visuospatial/ Executive (0/5) 4 1  Naming (0/3) 3 2  Attention: Read list of digits (0/2) 2 1  Attention: Read list of letters (0/1) 1 1  Attention: Serial 7 subtraction starting at 100 (0/3) 1 1  Language: Repeat phrase (0/2) 2 2  Language : Fluency (0/1) 0 0  Abstraction (0/2) 2 2  Delayed Recall (0/5) 1 1  Orientation (0/6) 6 5  Total 22 16  Adjusted Score (based on education) 23 17      The 10-year ASCVD risk score (Arnett DK, et al., 2019) is: 11.5%    Assessment & Plan:  Marland KitchenMarland KitchenViolet was seen today for hospitalization follow-up.  Diagnoses and all orders for this visit:  Hospital discharge follow-up  Anxiety -     hydrOXYzine (ATARAX) 25 MG tablet; TAKE 1 TABLET(25 MG) BY MOUTH EVERY Night at bedtime for sleep.  Moderate episode of recurrent major depressive disorder (HCC) -     ARIPiprazole (ABILIFY) 10 MG tablet; Take 1 tablet (10 mg total) by mouth daily. -     AMB Referral to Pharmacy Medication Management  Hematuria, unspecified type  Hallucinations -     AMB Referral to Pharmacy Medication Management -     Ambulatory referral to Neurology  Delirium -     AMB Referral to Pharmacy Medication Management -     Ambulatory referral to Neurology  Chronic pain syndrome -     cyclobenzaprine (FLEXERIL) 10 MG tablet; TAKE 1 TABLET(10 MG) BY MOUTH as needed for back pain.  Chronic bilateral low back  pain without sciatica -     cyclobenzaprine (FLEXERIL) 10 MG tablet; TAKE 1 TABLET(10 MG) BY MOUTH as needed for back pain.  GAD (generalized anxiety disorder)  Primary insomnia  Primary osteoarthritis of right knee  Medication management -     AMB Referral to Pharmacy Medication Management  Mild cognitive impairment -     Ambulatory referral to Neurology   Went through medication list Stop  ambien/oxycodone/remeron Pt was not able to leave urine sample today for UDS testing.  I would like for you to remain on the rest of medication and have pharmacy review Pt had previously done well on medication and not caused any problems Pt admits daughter was taking her oxycodone Pt just got oxycodone filled after this month will give tramadol as needed for chronic pain Continue flexeril Will refer to neurology for mild cognitive impairment Continue on aricept Follow up in 1 week.   Spent 50 minutes reviewing ED visit, images, medication changes, labs and discussing/reviewing current medications and changes.     Iran Planas, PA-C

## 2022-11-18 ENCOUNTER — Inpatient Hospital Stay: Payer: 59 | Admitting: Physician Assistant

## 2022-11-23 ENCOUNTER — Ambulatory Visit (INDEPENDENT_AMBULATORY_CARE_PROVIDER_SITE_OTHER): Payer: 59 | Admitting: Physician Assistant

## 2022-11-23 ENCOUNTER — Encounter: Payer: Self-pay | Admitting: Physician Assistant

## 2022-11-23 ENCOUNTER — Telehealth: Payer: Self-pay

## 2022-11-23 VITALS — BP 119/55 | HR 70 | Ht 64.0 in | Wt 127.0 lb

## 2022-11-23 DIAGNOSIS — F5101 Primary insomnia: Secondary | ICD-10-CM

## 2022-11-23 DIAGNOSIS — R410 Disorientation, unspecified: Secondary | ICD-10-CM

## 2022-11-23 DIAGNOSIS — F411 Generalized anxiety disorder: Secondary | ICD-10-CM

## 2022-11-23 DIAGNOSIS — G8929 Other chronic pain: Secondary | ICD-10-CM

## 2022-11-23 DIAGNOSIS — G894 Chronic pain syndrome: Secondary | ICD-10-CM

## 2022-11-23 DIAGNOSIS — M1711 Unilateral primary osteoarthritis, right knee: Secondary | ICD-10-CM

## 2022-11-23 DIAGNOSIS — R413 Other amnesia: Secondary | ICD-10-CM

## 2022-11-23 DIAGNOSIS — M545 Low back pain, unspecified: Secondary | ICD-10-CM

## 2022-11-23 LAB — POCT URINALYSIS DIP (CLINITEK)
Bilirubin, UA: NEGATIVE
Glucose, UA: NEGATIVE mg/dL
Ketones, POC UA: NEGATIVE mg/dL
Leukocytes, UA: NEGATIVE
Nitrite, UA: NEGATIVE
POC PROTEIN,UA: NEGATIVE
Spec Grav, UA: <=1.005 — AB (ref 1.010–1.025)
Urobilinogen, UA: 0.2 E.U./dL
pH, UA: 5 (ref 5.0–8.0)

## 2022-11-23 MED ORDER — TRAMADOL HCL 50 MG PO TABS: 50.0000 mg | ORAL_TABLET | Freq: Four times a day (QID) | ORAL | 0 refills | Status: AC | PRN

## 2022-11-23 MED ORDER — DONEPEZIL HCL 10 MG PO TABS: ORAL_TABLET | ORAL | 1 refills | Status: DC

## 2022-11-23 MED ORDER — CELECOXIB 200 MG PO CAPS: ORAL_CAPSULE | ORAL | 1 refills | Status: DC

## 2022-11-23 NOTE — Patient Instructions (Addendum)
Tramadol for break through pain Pharmacy will call to go over medications again Neurology should be calling as well

## 2022-11-23 NOTE — Progress Notes (Signed)
   Care Guide Note  11/23/2022 Name: Rachael Douglas MRN: [id number] DOB: 03/07/1956  Referred by: Donella Stade, PA-C Reason for referral : Care Coordination (Outreach to schedule with pharm d )   Rachael Douglas is a 67 y.o. year old female who is a primary care patient of Donella Stade, PA-C. Rachael Douglas was referred to the pharmacist for assistance related to COPD.    An unsuccessful telephone outreach was attempted today to contact the patient who was referred to the pharmacy team for assistance with medication assistance. Additional attempts will be made to contact the patient.   Noreene Larsson, [place], [place] [address] Direct Dial: [phone] [email or web address]

## 2022-11-23 NOTE — Progress Notes (Signed)
Established Patient Office Visit  Subjective   Patient ID: Rachael Douglas, female    DOB: 01-Aug-1956  Age: 67 y.o. MRN: [id number]  Chief Complaint  Patient presents with   Follow-up    HPI Pt is a 67 yo female who presents to the clinic for 1 week follow up after medication changes. She is doing well. She denies any hallucinations or confusion.  She does request tramadol for her pain since she has not had oxycodone. It was last filled on 2/12 but she reports her sister took it and has locked it up she has not had any. She uses pain medication for break through.   Patient Active Problem List   Diagnosis Date Noted   GAD (generalized anxiety disorder) 11/23/2022   Hallucinations 11/16/2022   Delirium 11/16/2022   Acute stress reaction 09/28/2022   Urinary retention 09/28/2022   Recurrent UTI 05/11/2022   Primary osteoarthritis of right knee 05/11/2022   Chronic pain syndrome 03/17/2022   Psoriasis 01/21/2022   Iron deficiency anemia secondary to inadequate dietary iron intake 01/21/2022   Axillary mass, right 01/21/2022   Memory changes 01/04/2022   At high risk for falls 01/04/2022   Closed fracture of distal end of both radius and ulna with routine healing, subsequent encounter 03/09/2021   Family history of Alzheimer's disease 07/29/2019   Easy bruising 09/12/2018   Palpable mass of lower back 05/31/2018   Chronic bilateral low back pain without sciatica 05/31/2018   Eczema 05/30/2018   Pruritus 02/21/2018   Actinic keratoses 12/03/2017   Left carotid bruit 08/15/2017   Chronic prescription benzodiazepine use 07/04/2017   Primary insomnia 05/16/2017   Collagenous colitis 09/09/2016   Multiple gastric ulcers 09/05/2016   Constipation 12/17/2014   Stricture and stenosis of esophagus 10/23/2014   Aortic valve replaced 10/23/2014   COPD, moderate ([place]) 09/25/2014   Hypothyroidism 05/16/2014   Anxiety disorder 05/16/2014   DDD (degenerative disc disease), lumbar  05/16/2014   Acid reflux 05/16/2014   HLD (hyperlipidemia) 05/16/2014   OP (osteoporosis) 05/16/2014   Major depressive disorder, recurrent episode ([place]) 05/16/2014   Arteriosclerosis of coronary artery 08/30/2013   Aortic heart valve narrowing 04/12/2013   Past Medical History:  Diagnosis Date   Arthritis    Heart disease    Hyperlipidemia    Hypertension    PUD (peptic ulcer disease)    Thyroid disease    Family History  Problem Relation Age of Onset   Cancer Other    Depression Other    Diabetes Other    Hypertension Other    Breast cancer Other    Allergies  Allergen Reactions   Penicillins Swelling   Ranitidine Other (See Comments)    Facial swelling   Alendronate Other (See Comments)    unknown   Bisphosphonates     Nausea/stomach pains.       ROS   See HPI.  Objective:     BP (!) 119/55   Pulse 70   Ht '5\' 4"'$  (1.626 m)   Wt 127 lb (57.6 kg)   SpO2 97%   BMI 21.80 kg/m  BP Readings from Last 3 Encounters:  11/23/22 (!) 119/55  11/16/22 (!) 127/59  10/22/22 111/70   Wt Readings from Last 3 Encounters:  11/23/22 127 lb (57.6 kg)  11/16/22 129 lb (58.5 kg)  10/22/22 125 lb (56.7 kg)    Physical Exam Constitutional:      Appearance: Normal appearance.  HENT:     Head:  Normocephalic.  Cardiovascular:     Rate and Rhythm: Normal rate and regular rhythm.     Heart sounds: Murmur heard.  Pulmonary:     Effort: Pulmonary effort is normal.     Breath sounds: Normal breath sounds.  Musculoskeletal:     Right lower leg: No edema.     Left lower leg: No edema.  Neurological:     General: No focal deficit present.     Mental Status: She is alert and oriented to person, place, and time.  Psychiatric:        Mood and Affect: Mood normal.      The 10-year ASCVD risk score (Arnett DK, et al., 2019) is: 11.5%    Assessment & Plan:  Marland KitchenMarland KitchenViolet was seen today for follow-up.  Diagnoses and all orders for this visit:  Delirium -     DRUG  MONITORING, PANEL 6 WITH CONFIRMATION, URINE -     POCT URINALYSIS DIP (CLINITEK)  Primary osteoarthritis of right knee -     celecoxib (CELEBREX) 200 MG capsule; One to 2 tablets by mouth daily as needed for pain. -     traMADol (ULTRAM) 50 MG tablet; Take 1 tablet (50 mg total) by mouth every 6 (six) hours as needed for up to 5 days.  Memory changes -     donepezil (ARICEPT) 10 MG tablet; TAKE 1 TABLET(10 MG) BY MOUTH AT BEDTIME -     DRUG MONITORING, PANEL 6 WITH CONFIRMATION, URINE -     POCT URINALYSIS DIP (CLINITEK)  Chronic pain syndrome -     traMADol (ULTRAM) 50 MG tablet; Take 1 tablet (50 mg total) by mouth every 6 (six) hours as needed for up to 5 days. -     DRUG MONITORING, PANEL 6 WITH CONFIRMATION, URINE  GAD (generalized anxiety disorder)  Primary insomnia  Chronic bilateral low back pain without sciatica -     traMADol (ULTRAM) 50 MG tablet; Take 1 tablet (50 mg total) by mouth every 6 (six) hours as needed for up to 5 days.   Pt seems to be doing better, delirium resolved Medications seem to be working for mood/sleep Her pain is not controlled and she does want something Celebrex refilled UDS ordered today .Marland KitchenPDMP reviewed during this encounter. Will give tramadol for break through pain to replace oxycodone Aricept refilled Pharmacy review scheduled Neurology consult referral made    Iran Planas, PA-C

## 2022-11-24 LAB — DRUG MONITORING, PANEL 6 WITH CONFIRMATION, URINE
6 Acetylmorphine: NEGATIVE ng/mL (ref <10)
Alcohol Metabolites: NEGATIVE ng/mL (ref <500)
Amphetamines: NEGATIVE ng/mL (ref <500)
Barbiturates: NEGATIVE ng/mL (ref <300)
Benzodiazepines: NEGATIVE ng/mL (ref <100)
Cocaine Metabolite: NEGATIVE ng/mL (ref <150)
Creatinine: 9.8 mg/dL — ABNORMAL LOW (ref >=20.0)
Marijuana Metabolite: NEGATIVE ng/mL (ref <20)
Methadone Metabolite: NEGATIVE ng/mL (ref <100)
Opiates: NEGATIVE ng/mL (ref <100)
Oxidant: NEGATIVE ug/mL (ref <200)
Oxycodone: NEGATIVE ng/mL (ref <100)
Phencyclidine: NEGATIVE ng/mL (ref <25)
Specific Gravity: 1.003 (ref >=1.003)
pH: 4.8 (ref 4.5–9.0)

## 2022-11-24 LAB — DM TEMPLATE

## 2022-11-25 NOTE — Progress Notes (Signed)
No concerns. Marijuana metabolites negative.

## 2022-11-28 NOTE — Progress Notes (Signed)
   Care Guide Note  11/28/2022 Name: Rachael Douglas MRN: [id number] DOB: 1955/11/24  Referred by: Donella Stade, PA-C Reason for referral : Care Coordination (Outreach to schedule with pharm d )   Rachael Douglas is a 67 y.o. year old female who is a primary care patient of Donella Stade, PA-C. Rachael Douglas was referred to the pharmacist for assistance related to DM.    Successful contact was made with the patient to discuss pharmacy services including being ready for the pharmacist to call at least 5 minutes before the scheduled appointment time, to have medication bottles and any blood sugar or blood pressure readings ready for review. The patient agreed to meet with the pharmacist via with the pharmacist via telephone visit on (date/time).  11/29/2022  Rachael Douglas, [place], [place] [address] Direct Dial: [phone] [email or web address]

## 2022-11-29 ENCOUNTER — Other Ambulatory Visit: Payer: 59 | Admitting: Pharmacist

## 2022-11-29 ENCOUNTER — Other Ambulatory Visit: Payer: Self-pay | Admitting: Neurology

## 2022-11-29 DIAGNOSIS — M5416 Radiculopathy, lumbar region: Secondary | ICD-10-CM

## 2022-11-29 DIAGNOSIS — I2510 Atherosclerotic heart disease of native coronary artery without angina pectoris: Secondary | ICD-10-CM

## 2022-11-29 DIAGNOSIS — E039 Hypothyroidism, unspecified: Secondary | ICD-10-CM

## 2022-11-29 DIAGNOSIS — F419 Anxiety disorder, unspecified: Secondary | ICD-10-CM

## 2022-11-29 DIAGNOSIS — M5136 Other intervertebral disc degeneration, lumbar region: Secondary | ICD-10-CM

## 2022-11-29 DIAGNOSIS — E782 Mixed hyperlipidemia: Secondary | ICD-10-CM

## 2022-11-29 MED ORDER — METOPROLOL TARTRATE 25 MG PO TABS: 25.0000 mg | ORAL_TABLET | Freq: Two times a day (BID) | ORAL | 1 refills | Status: DC

## 2022-11-29 MED ORDER — LEVOTHYROXINE SODIUM 75 MCG PO TABS: ORAL_TABLET | ORAL | 1 refills | Status: DC

## 2022-11-29 MED ORDER — HYDROXYZINE HCL 25 MG PO TABS: ORAL_TABLET | ORAL | 1 refills | Status: DC

## 2022-11-29 MED ORDER — PRAVASTATIN SODIUM 80 MG PO TABS: ORAL_TABLET | ORAL | 1 refills | Status: DC

## 2022-11-29 MED ORDER — TRAMADOL HCL 50 MG PO TABS: 50.0000 mg | ORAL_TABLET | Freq: Two times a day (BID) | ORAL | 0 refills | Status: DC | PRN

## 2022-11-29 MED ORDER — DICLOFENAC SODIUM 1 % EX GEL: 4.0000 g | Freq: Four times a day (QID) | CUTANEOUS | 1 refills | Status: AC

## 2022-11-29 MED ORDER — GABAPENTIN 600 MG PO TABS: 1200.0000 mg | ORAL_TABLET | Freq: Three times a day (TID) | ORAL | 1 refills | Status: DC

## 2022-11-29 NOTE — Addendum Note (Signed)
Addended by: Donella Stade on: 11/29/2022 02:51 PM   Modules accepted: Orders

## 2022-11-29 NOTE — Telephone Encounter (Signed)
Patient called for refills. Sent to new pharmacy.

## 2022-11-29 NOTE — Progress Notes (Signed)
..  PDMP reviewed during this encounter. No concerns.  Last fill was 2/12 of oxycodone.  Sent tramadol to replace oxycodone. Tramadol as needed bid for mild to moderate pain.

## 2022-11-29 NOTE — Patient Instructions (Signed)
Neely,  Thank you for speaking with me today. Keep organizing your medications using the weekly pill box, and do not hesitate to contact me if you have further questions about your medications.  Take care, Luana Shu, PharmD Clinical Pharmacist [facility] [phone]

## 2022-11-29 NOTE — Progress Notes (Signed)
11/29/2022 Name: Rachael Douglas MRN: [id number] DOB: Jun 06, 1956  Chief Complaint  Patient presents with   Medication Management    Rachael Douglas is a 67 y.o. year old female who presented for a telephone visit.   They were referred to the pharmacist by their PCP for assistance in managing complex medication management.   Subjective:  Care Team: Primary Care Provider: Lavada Mesi ; Next Scheduled Visit: 02/28/23  Medication Access/Adherence  Current Pharmacy:  [facility] [place], [place] - [address] AT [place] [address] Phone: [phone] Fax: [phone]   Patient reports affordability concerns with their medications: No  Patient reports access/transportation concerns to their pharmacy: No  Patient reports adherence concerns with their medications:  No  uses pillbox    Medication Management:  Current adherence strategy: using pillbox, one week at a time   Patient reports Good adherence to medications  Patient reports the following barriers to adherence: none reported    Objective:  No results found for: "HGBA1C"  Lab Results  Component Value Date   CREATININE 0.78 01/04/2022   BUN 11 01/04/2022   NA 140 01/04/2022   K 4.2 01/04/2022   CL 101 01/04/2022   CO2 31 01/04/2022    Lab Results  Component Value Date   CHOL 133 03/05/2021   HDL 57 03/05/2021   LDLCALC 58 03/05/2021   TRIG 95 03/05/2021   CHOLHDL 2.3 03/05/2021    Medications Reviewed Today     Reviewed by Darius Bump, [place] (Pharmacist) on 11/29/22 at 1335  Med List Status: <None>   Medication Order Taking? Sig Documenting Provider Last Dose Status Informant  albuterol (VENTOLIN HFA) 108 (90 Base) MCG/ACT inhaler [id number] Yes Inhale 1-2 puffs into the lungs every 6 (six) hours as needed for wheezing or shortness of breath. INHALE 2 PUFFS EVERY 6 HOURS AS NEEDED Donella Stade, PA-C Taking Active   AMBULATORY NON FORMULARY MEDICATION [id number] Yes In and out catheter and supplies to self catherize every night. Donella Stade, PA-C Taking Active   ARIPiprazole (ABILIFY) 10 MG tablet [id number] Yes Take 1 tablet (10 mg total) by mouth daily. Donella Stade, PA-C Taking Active   busPIRone (BUSPAR) 15 MG tablet [id number] Yes Take 1 tablet (15 mg total) by mouth 3 (three) times daily. Donella Stade, PA-C Taking Active   celecoxib (CELEBREX) 200 MG capsule [id number] Yes One to 2 tablets by mouth daily as needed for pain. Donella Stade, PA-C Taking Active   clobetasol ointment (TEMOVATE) 0.05 % [id number] Yes Apply topically. [provider] Taking Active   cyclobenzaprine (FLEXERIL) 10 MG tablet [id number] Yes TAKE 1 TABLET(10 MG) BY MOUTH as needed for back pain. Donella Stade, PA-C Taking Active   diclofenac Sodium (VOLTAREN) 1 % GEL [id number] Yes Apply 4 g topically 4 (four) times daily. To affected joint. Donella Stade, PA-C Taking Active   donepezil (ARICEPT) 10 MG tablet [id number] Yes TAKE 1 TABLET(10 MG) BY MOUTH AT BEDTIME Breeback, Jade L, PA-C Taking Active   DULoxetine (CYMBALTA) 60 MG capsule [id number] Yes Take 2 capsules (120 mg total) by mouth daily. Donella Stade, PA-C Taking Active   famotidine (PEPCID) 40 MG tablet [id number] Yes TAKE 1 TABLET(40 MG) BY MOUTH AT BEDTIME Breeback, Jade L, PA-C Taking Active   ferrous sulfate 325 (65 FE) MG EC tablet [id number] Yes TAKE 1  TABLET(325 MG) BY MOUTH DAILY WITH BREAKFAST Breeback, Jade L, PA-C Taking Active   fluticasone (FLONASE) 50 MCG/ACT nasal spray [id number] Yes Place 2 sprays into both nostrils daily. Donella Stade, PA-C Taking Active   furosemide (LASIX) 40 MG tablet [id number] Yes TAKE 1 TABLET(40 MG) BY MOUTH DAILY AS NEEDED Breeback, Jade L, PA-C Taking Active   gabapentin (NEURONTIN) 600 MG tablet [id number] Yes Take 2 tablets (1,200 mg total) by mouth 3 (three) times daily. Donella Stade, PA-C Taking Active   hydrOXYzine (ATARAX) 25 MG tablet [id number] Yes TAKE 1 TABLET(25 MG) BY MOUTH EVERY Night at bedtime for sleep. Donella Stade, PA-C Taking Active   levothyroxine (SYNTHROID) 75 MCG tablet [id number] Yes TAKE 1 TABLET(75 MCG) BY MOUTH DAILY BEFORE BREAKFAST Donella Stade, PA-C Taking Active   LINZESS 290 MCG CAPS capsule [id number] Yes TAKE 1 CAPSULE(290 MCG) BY MOUTH DAILY BEFORE BREAKFAST Breeback, Jade L, PA-C Taking Active   metoprolol tartrate (LOPRESSOR) 25 MG tablet [id number] Yes Take 1 tablet (25 mg total) by mouth 2 (two) times daily. Donella Stade, PA-C Taking Active   ondansetron (ZOFRAN ODT) 4 MG disintegrating tablet [id number] Yes Take 1 tablet (4 mg total) by mouth every 8 (eight) hours as needed for nausea or vomiting. Breeback, Jade L, PA-C Taking Active   OTEZLA 30 MG TABS [id number] No Take 1 tablet (30 mg total) by mouth 2 (two) times daily.  Patient not taking: Reported on 11/29/2022   Donella Stade, PA-C Not Taking Active   pantoprazole (PROTONIX) 40 MG tablet [id number] Yes Take 1 tablet (40 mg total) by mouth daily. Donella Stade, PA-C Taking Active   potassium chloride (KLOR-CON) 10 MEQ tablet [id number] Yes Take 1 tablet (10 mEq total) by mouth daily. Donella Stade, PA-C Taking Active   pravastatin (PRAVACHOL) 80 MG tablet [id number] Yes TAKE 1 TABLET(80 MG) BY MOUTH DAILY Donella Stade, PA-C Taking Active   SKYRIZI PEN 150 MG/ML SOAJ [id number] Yes Inject into the skin. [provider] Taking Active               Assessment/Plan:   Medication Management: - Currently strategy sufficient to maintain appropriate adherence to prescribed medication regimen - Suggested use of weekly pill box to organize medications - Recommend continue current regimen - Provided patient with direct contact information if she needs further pharmacist support - Patient did request tramadol sent to new [facility] on [address], will  notify PCP of this request    Follow Up Plan: as needed  Larinda Buttery, PharmD Clinical Pharmacist [facility] [phone]

## 2022-11-30 ENCOUNTER — Other Ambulatory Visit: Payer: Self-pay

## 2022-11-30 MED ORDER — TRIAMCINOLONE ACETONIDE 0.1 % MT PSTE: PASTE | OROMUCOSAL | 1 refills | Status: DC

## 2022-12-19 ENCOUNTER — Other Ambulatory Visit: Payer: Self-pay

## 2022-12-19 DIAGNOSIS — E039 Hypothyroidism, unspecified: Secondary | ICD-10-CM

## 2022-12-19 MED ORDER — LEVOTHYROXINE SODIUM 75 MCG PO TABS: ORAL_TABLET | ORAL | 0 refills | Status: DC

## 2022-12-29 ENCOUNTER — Other Ambulatory Visit: Payer: Self-pay | Admitting: Physician Assistant

## 2023-01-02 ENCOUNTER — Other Ambulatory Visit: Payer: Self-pay

## 2023-01-02 DIAGNOSIS — F411 Generalized anxiety disorder: Secondary | ICD-10-CM

## 2023-01-02 MED ORDER — BUSPIRONE HCL 15 MG PO TABS: 15.0000 mg | ORAL_TABLET | Freq: Three times a day (TID) | ORAL | 3 refills | Status: DC

## 2023-01-02 MED ORDER — POTASSIUM CHLORIDE ER 10 MEQ PO TBCR: 10.0000 meq | EXTENDED_RELEASE_TABLET | Freq: Every day | ORAL | 1 refills | Status: DC

## 2023-01-02 NOTE — Telephone Encounter (Signed)
Per last office notes.   Last OV: 11/23/22 Next OV: 02/28/23 Last RF:  2/12  Rx not listed in active med list.

## 2023-02-01 ENCOUNTER — Other Ambulatory Visit: Payer: Self-pay | Admitting: Physician Assistant

## 2023-02-01 DIAGNOSIS — R609 Edema, unspecified: Secondary | ICD-10-CM

## 2023-02-01 MED ORDER — FUROSEMIDE 40 MG PO TABS: ORAL_TABLET | ORAL | 0 refills | Status: DC

## 2023-02-01 MED ORDER — TRAMADOL HCL 50 MG PO TABS: ORAL_TABLET | ORAL | 0 refills | Status: DC

## 2023-02-01 NOTE — Telephone Encounter (Signed)
Patient called requesting a refill of furosemide (LASIX) 40 MG tablet [[id number]] and  traMADol (ULTRAM) 50 MG tablet [[id number]]    Pharmacy -  [id number] - [place], [place] - [address] AT [facility] [address]

## 2023-02-01 NOTE — Telephone Encounter (Signed)
..  PDMP reviewed during this encounter. No concerns UTD appt Sent tramadol

## 2023-02-16 ENCOUNTER — Ambulatory Visit (INDEPENDENT_AMBULATORY_CARE_PROVIDER_SITE_OTHER): Payer: 59 | Admitting: Family Medicine

## 2023-02-16 ENCOUNTER — Encounter: Payer: Self-pay | Admitting: Family Medicine

## 2023-02-16 VITALS — BP 127/72 | HR 71 | Ht 64.0 in | Wt 124.0 lb

## 2023-02-16 DIAGNOSIS — M545 Low back pain, unspecified: Secondary | ICD-10-CM | POA: Diagnosis not present

## 2023-02-16 DIAGNOSIS — G894 Chronic pain syndrome: Secondary | ICD-10-CM

## 2023-02-16 DIAGNOSIS — L03113 Cellulitis of right upper limb: Secondary | ICD-10-CM | POA: Diagnosis not present

## 2023-02-16 MED ORDER — CYCLOBENZAPRINE HCL 10 MG PO TABS: ORAL_TABLET | ORAL | 0 refills | Status: DC

## 2023-02-16 MED ORDER — DOXYCYCLINE HYCLATE 100 MG PO TABS: 100.0000 mg | ORAL_TABLET | Freq: Two times a day (BID) | ORAL | 0 refills | Status: DC

## 2023-02-16 NOTE — Assessment & Plan Note (Signed)
She does have penicillin allergy with swelling listed.  She does not recall exactly what her reaction was to this.  Cephalosporins for now.  Adding doxycycline.  Return to recheck early next week.  Red flags discussed and I instructed her to seek urgent/emergency care over the weekend if symptoms significantly worsen.

## 2023-02-16 NOTE — Patient Instructions (Signed)
Start doxycycline.  If worsening over the weekend, see urgent care or ED.  F/u Mon-Tuesday next week.    Cellulitis, Adult  Cellulitis is a skin infection. The infected area is often warm, red, swollen, and sore. It occurs most often on the legs, feet, and toes, but can happen on any part of the body. This condition can be life-threatening without treatment. It is very important to get treated right away. What are the causes? This condition is caused by bacteria. The bacteria enter through a break in the skin, such as: A cut. A burn. A bug bite. An animal bite. An open sore. A crack. What increases the risk? Having a weak body's defense system (immune system). Being older than 68 years old. Having a blood sugar problem (diabetes). Having a long-term liver disease (cirrhosis) or kidney disease. Being very overweight (obese). Having a skin problem, such as: An itchy rash. A rash caused by a fungus. A rash with blisters. Slow movement of blood in the veins (venous stasis). Fluid buildup below the skin (edema). This condition is more likely to occur in people who: Have open cuts, burns, bites, or scrapes on the skin. Have been treated with high-energy rays (radiation). Use IV drugs. What are the signs or symptoms? Skin that: Looks red or purple, or slightly darker than your usual skin color. Has streaks. Has spots. Is swollen. Is sore or painful when you touch it. Is warm. A fever. Chills. Blisters. Tiredness (fatigue). How is this treated? Medicines to treat infections or allergies. Rest. Placing cold or warm cloths on the skin. Staying in the hospital, if the condition is very bad. You may need medicines through an IV. Follow these instructions at home: Medicines Take over-the-counter and prescription medicines only as told by your doctor. If you were prescribed antibiotics, take them as told by your doctor. Do not stop using them even if you start to feel  better. General instructions Drink enough fluid to keep your pee (urine) pale yellow. Do not touch or rub the infected area. Raise (elevate) the infected area above the level of your heart while you are sitting or lying down. Return to your normal activities when your doctor says that it is safe. Place cold or warm cloths on the area as told by your doctor. Keep all follow-up visits. Your doctor will need to make sure that a more serious infection is not developing. Contact a doctor if: You have a fever. You do not start to get better after 1-2 days of treatment. Your bone or joint under the infected area starts to hurt after the skin has healed. Your infection comes back in the same area or another area. Signs of this may include: You have a swollen bump in the area. Your red area gets larger, turns dark in color, or hurts more. You have more fluid coming from the wound. Pus or a bad smell develops in your infected area. You have more pain. You feel sick and have muscle aches and weakness. You develop vomiting or watery poop that will not go away. Get help right away if: You see red streaks coming from the area. You notice the skin turns purple or black and falls off. These symptoms may be an emergency. Get help right away. Call 911. Do not wait to see if the symptoms will go away. Do not drive yourself to the hospital. This information is not intended to replace advice given to you by your health care provider. Make sure  you discuss any questions you have with your health care provider. Document Revised: 05/03/2022 Document Reviewed: 05/03/2022 Elsevier Patient Education  2024 [employer].

## 2023-02-16 NOTE — Progress Notes (Signed)
Rachael Douglas - 66 y.o. female MRN [id number]  Date of birth: 1955/10/13  Subjective Chief Complaint  Patient presents with   hand swelling    HPI Rachael Douglas  is a 67 year old female here today with complaint of redness and swelling of the right hand.  This started a couple days ago.  She does have some pain with movement of the hand.  She has not had any fever or chills.  She has not tried anything so far to help with management of symptoms.  ROS:  A comprehensive ROS was completed and negative except as noted per HPI   Allergies  Allergen Reactions   Penicillins Swelling   Ranitidine Other (See Comments)    Facial swelling   Alendronate Other (See Comments)    unknown   Bisphosphonates     Nausea/stomach pains.     Past Medical History:  Diagnosis Date   Arthritis    Heart disease    Hyperlipidemia    Hypertension    PUD (peptic ulcer disease)    Thyroid disease     Past Surgical History:  Procedure Laterality Date   AORTIC VALVE REPLACEMENT     TOTAL ABDOMINAL HYSTERECTOMY      Social History   Socioeconomic History   Marital status: Married    Spouse name: [name]   Number of children: 2   Years of education: 12   Highest education level: 12th grade  Occupational History    Comment: Retired  Tobacco Use   Smoking status: Some Days    Types: Cigarettes, E-cigarettes    Last attempt to quit: 05/30/2014    Years since quitting: 8.7   Smokeless tobacco: Never  Vaping Use   Vaping Use: Some days   Substances: Nicotine  Substance and Sexual Activity   Alcohol use: No    Alcohol/week: 0.0 standard drinks of alcohol   Drug use: No   Sexual activity: Not Currently  Other Topics Concern   Not on file  Social History Narrative   Lives with her husband. She enjoys reading and watching television.   Social Determinants of Health   Financial Resource Strain: Low Risk  (03/07/2022)   Overall Financial Resource Strain (CARDIA)    Difficulty of Paying  Living Expenses: Not hard at all  Food Insecurity: No Food Insecurity (03/07/2022)   Hunger Vital Sign    Worried About Running Out of Food in the Last Year: Never true    Ran Out of Food in the Last Year: Never true  Transportation Needs: No Transportation Needs (03/07/2022)   PRAPARE - Administrator, Civil Service (Medical): No    Lack of Transportation (Non-Medical): No  Physical Activity: Inactive (03/07/2022)   Exercise Vital Sign    Days of Exercise per Week: 0 days    Minutes of Exercise per Session: 0 min  Stress: No Stress Concern Present (03/07/2022)   [employer] of Occupational Health - Occupational Stress Questionnaire    Feeling of Stress : Not at all  Social Connections: Moderately Isolated (03/07/2022)   Social Connection and Isolation Panel [NHANES]    Frequency of Communication with Friends and Family: More than three times a week    Frequency of Social Gatherings with Friends and Family: More than three times a week    Attends Religious Services: Never    Database administrator or Organizations: No    Attends Banker Meetings: Never    Marital Status: Married  Family History  Problem Relation Age of Onset   Cancer Other    Depression Other    Diabetes Other    Hypertension Other    Breast cancer Other     Health Maintenance  Topic Date Due   Zoster Vaccines- Shingrix (1 of 2) Never done   Lung Cancer Screening  Never done   Medicare Annual Wellness (AWV)  03/08/2023   COVID-19 Vaccine (5 - 2023-24 season) 11/17/2023 (Originally 08/17/2022)   INFLUENZA VACCINE  04/20/2023   MAMMOGRAM  07/01/2023   Colonoscopy  04/13/2032   Pneumonia Vaccine 37+ Years old  Completed   DEXA SCAN  Completed   Hepatitis C Screening  Completed   HPV VACCINES  Aged Out   DTaP/Tdap/Td  Discontinued      ----------------------------------------------------------------------------------------------------------------------------------------------------------------------------------------------------------------- Physical Exam BP 127/72   Pulse 71   Ht 5\' 4"  (1.626 m)   Wt 124 lb (56.2 kg)   SpO2 99%   BMI 21.28 kg/m   Physical Exam Constitutional:      Appearance: Normal appearance.  HENT:     Head: Normocephalic and atraumatic.  Eyes:     General: No scleral icterus. Skin:    Comments: Swelling and erythema of the dorsum of right hand.  There is some tenderness to palpation.  No joint tenderness or swelling.  Neurological:     Mental Status: She is alert.     ------------------------------------------------------------------------------------------------------------------------------------------------------------------------------------------------------------------- Assessment and Plan  Cellulitis of right hand She does have penicillin allergy with swelling listed.  She does not recall exactly what her reaction was to this.  Cephalosporins for now.  Adding doxycycline.  Return to recheck early next week.  Red flags discussed and I instructed her to seek urgent/emergency care over the weekend if symptoms significantly worsen.   Meds ordered this encounter  Medications   doxycycline (VIBRA-TABS) 100 MG tablet    Sig: Take 1 tablet (100 mg total) by mouth 2 (two) times daily.    Dispense:  20 tablet    Refill:  0   cyclobenzaprine (FLEXERIL) 10 MG tablet    Sig: TAKE 1 TABLET(10 MG) BY MOUTH as needed for back pain.    Dispense:  90 tablet    Refill:  0    No follow-ups on file.    This visit occurred during the SARS-CoV-2 public health emergency.  Safety protocols were in place, including screening questions prior to the visit, additional usage of staff PPE, and extensive cleaning of exam room while observing appropriate contact time as indicated for disinfecting  solutions.

## 2023-02-17 ENCOUNTER — Telehealth: Payer: Self-pay

## 2023-02-17 NOTE — Telephone Encounter (Signed)
The pharmacy called and left a message stating they need directions for the Flexeril. How many can she take daily?

## 2023-02-20 ENCOUNTER — Ambulatory Visit: Payer: 59 | Admitting: Physician Assistant

## 2023-02-21 NOTE — Telephone Encounter (Signed)
Pharmacy advised  

## 2023-02-28 ENCOUNTER — Ambulatory Visit: Payer: 59 | Admitting: Physician Assistant

## 2023-03-01 ENCOUNTER — Telehealth: Payer: Self-pay | Admitting: Physician Assistant

## 2023-03-01 ENCOUNTER — Other Ambulatory Visit: Payer: Self-pay | Admitting: Physician Assistant

## 2023-03-01 ENCOUNTER — Other Ambulatory Visit: Payer: Self-pay

## 2023-03-01 DIAGNOSIS — F331 Major depressive disorder, recurrent, moderate: Secondary | ICD-10-CM

## 2023-03-01 MED ORDER — ARIPIPRAZOLE 10 MG PO TABS
10.0000 mg | ORAL_TABLET | Freq: Every day | ORAL | 0 refills | Status: DC
Start: 2023-03-01 — End: 2023-03-30

## 2023-03-01 MED ORDER — DULOXETINE HCL 60 MG PO CPEP
120.0000 mg | ORAL_CAPSULE | Freq: Every day | ORAL | 0 refills | Status: DC
Start: 2023-03-01 — End: 2023-05-25

## 2023-03-01 NOTE — Telephone Encounter (Signed)
Patient called requesting a refill of traMADol (ULTRAM) 50 MG tablet [[id number]]   Patient would like the refill to have 90 pills.  Pharmacy -  [id number] - [place], [place] - [phone] AT [facility] [address]

## 2023-03-06 ENCOUNTER — Other Ambulatory Visit: Payer: Self-pay | Admitting: Physician Assistant

## 2023-03-06 ENCOUNTER — Telehealth: Payer: Self-pay | Admitting: Physician Assistant

## 2023-03-06 DIAGNOSIS — R609 Edema, unspecified: Secondary | ICD-10-CM

## 2023-03-06 DIAGNOSIS — K219 Gastro-esophageal reflux disease without esophagitis: Secondary | ICD-10-CM

## 2023-03-06 MED ORDER — FUROSEMIDE 40 MG PO TABS
ORAL_TABLET | ORAL | 0 refills | Status: DC
Start: 2023-03-06 — End: 2023-11-01

## 2023-03-06 MED ORDER — PANTOPRAZOLE SODIUM 40 MG PO TBEC
40.0000 mg | DELAYED_RELEASE_TABLET | Freq: Every day | ORAL | 0 refills | Status: DC
Start: 2023-03-06 — End: 2023-04-25

## 2023-03-06 NOTE — Telephone Encounter (Signed)
Patient called requesting a refill of pravastatin (PRAVACHOL) 80 MG tablet [[id number]]   Patient would also like to change the quantity of her furosemide (LASIX) 40 MG tablet [[id number]] prescription from 30 pills to 90 pills.  Pharmacy ;   [facility] #[id number] - [place], [place] - [address] AT [facility] [address]

## 2023-03-08 ENCOUNTER — Encounter: Payer: Self-pay | Admitting: Physician Assistant

## 2023-03-08 ENCOUNTER — Ambulatory Visit (INDEPENDENT_AMBULATORY_CARE_PROVIDER_SITE_OTHER): Payer: 59 | Admitting: Physician Assistant

## 2023-03-08 ENCOUNTER — Telehealth: Payer: Self-pay | Admitting: Physician Assistant

## 2023-03-08 VITALS — BP 111/57 | HR 97 | Ht 64.0 in | Wt 123.0 lb

## 2023-03-08 DIAGNOSIS — F331 Major depressive disorder, recurrent, moderate: Secondary | ICD-10-CM | POA: Diagnosis not present

## 2023-03-08 DIAGNOSIS — G894 Chronic pain syndrome: Secondary | ICD-10-CM

## 2023-03-08 DIAGNOSIS — F5101 Primary insomnia: Secondary | ICD-10-CM

## 2023-03-08 DIAGNOSIS — E039 Hypothyroidism, unspecified: Secondary | ICD-10-CM | POA: Diagnosis not present

## 2023-03-08 DIAGNOSIS — F411 Generalized anxiety disorder: Secondary | ICD-10-CM

## 2023-03-08 MED ORDER — TRAZODONE HCL 50 MG PO TABS
ORAL_TABLET | ORAL | 0 refills | Status: DC
Start: 2023-03-08 — End: 2023-06-02

## 2023-03-08 MED ORDER — TRAMADOL HCL 50 MG PO TABS
ORAL_TABLET | ORAL | 0 refills | Status: DC
Start: 2023-03-08 — End: 2023-05-25

## 2023-03-08 NOTE — Telephone Encounter (Signed)
Patient's daughter, Elnita Maxwell. She is requesting a refill on her sleep medicine.Can script be sent over today?  Pharmacy: [facility]

## 2023-03-08 NOTE — Progress Notes (Signed)
Established Patient Office Visit  Subjective   Patient ID: Rachael Douglas, female    DOB: 07-Mar-1956  Age: 67 y.o. MRN: [id number]  Chief Complaint  Patient presents with   Follow-up    HPI Pt is a 67 yo female with COPD, AV narrowing, hypothyroidism, GERD, osteoporosis, MDD, anxiety who presents to the clinic for medication refills.   Her mood is doing well. No problems with breathing. Her anxiety is always up some but doing ok. She is hurting more since going off oxycodone and using tramadol. She is very tired all the time. She admits to not sleeping well at night. She used to be on ambien for a long time but was taken off after episode of memory changes and hospitalization. Failed remeron. No SI/HC.   [name]. Active Ambulatory Problems    Diagnosis Date Noted   Hypothyroidism 05/16/2014   Anxiety disorder 05/16/2014   Aortic heart valve narrowing 04/12/2013   DDD (degenerative disc disease), lumbar 05/16/2014   Acid reflux 05/16/2014   HLD (hyperlipidemia) 05/16/2014   Arteriosclerosis of coronary artery 08/30/2013   OP (osteoporosis) 05/16/2014   Major depressive disorder, recurrent episode (HCC) 05/16/2014   COPD, moderate (HCC) 09/25/2014   Stricture and stenosis of esophagus 10/23/2014   Aortic valve replaced 10/23/2014   Constipation 12/17/2014   Multiple gastric ulcers 09/05/2016   Collagenous colitis 09/09/2016   Primary insomnia 05/16/2017   Chronic prescription benzodiazepine use 07/04/2017   Left carotid bruit 08/15/2017   Actinic keratoses 12/03/2017   Pruritus 02/21/2018   Eczema 05/30/2018   Palpable mass of lower back 05/31/2018   Chronic bilateral low back pain without sciatica 05/31/2018   Easy bruising 09/12/2018   Family history of Alzheimer's disease 07/29/2019   Closed fracture of distal end of both radius and ulna with routine healing, subsequent encounter 03/09/2021   Memory changes 01/04/2022   At high risk for falls 01/04/2022   Psoriasis  01/21/2022   Iron deficiency anemia secondary to inadequate dietary iron intake 01/21/2022   Axillary mass, right 01/21/2022   Chronic pain syndrome 03/17/2022   Recurrent UTI 05/11/2022   Primary osteoarthritis of right knee 05/11/2022   Acute stress reaction 09/28/2022   Urinary retention 09/28/2022   Hallucinations 11/16/2022   Delirium 11/16/2022   GAD (generalized anxiety disorder) 11/23/2022   Cellulitis of right hand 02/16/2023   Resolved Ambulatory Problems    Diagnosis Date Noted   Arthropathia 07/10/2008   Leg pain 12/04/2012   Anxiety and depression 05/16/2014   Gastric catarrh 08/30/2013   Complication of surgery 11/13/2015   Left foot pain 02/05/2016   Failure to attend appointment 05/05/2016   Diarrhea 08/01/2016   Right shoulder pain 11/23/2016   Left shoulder pain 11/23/2016   Trochanteric bursitis of both hips 12/07/2016   Rash and nonspecific skin eruption 07/04/2017   Ankle ligament laxity, right 05/28/2018   Anxiety 05/31/2018   Headache 02/26/2020   Past Medical History:  Diagnosis Date   Arthritis    Heart disease    Hyperlipidemia    Hypertension    PUD (peptic ulcer disease)    Thyroid disease      ROS See HPI>    Objective:     BP (!) 111/57 (BP Location: Left Arm, Patient Position: Sitting, Cuff Size: Normal)   Pulse 97   Ht 5\' 4"  (1.626 m)   Wt 123 lb (55.8 kg)   SpO2 97%   BMI 21.11 kg/m  BP Readings from Last 3 Encounters:  03/08/23 (!) 111/57  02/16/23 127/72  11/23/22 (!) 119/55   Wt Readings from Last 3 Encounters:  03/08/23 123 lb (55.8 kg)  02/16/23 124 lb (56.2 kg)  11/23/22 127 lb (57.6 kg)    ..    03/08/2023    3:06 PM 03/08/2023    2:30 PM 03/07/2022    1:17 PM 03/07/2022    1:12 PM 01/04/2022    4:23 PM  Depression screen PHQ 2/9  Decreased Interest 0 0 0 0 3  Down, Depressed, Hopeless 0 0 0 0 2  PHQ - 2 Score 0 0 0 0 5  Altered sleeping 3  0  3  Tired, decreased energy 3  0  3  Change in appetite 0  0   0  Feeling bad or failure about yourself  0  0  0  Trouble concentrating 0  0  3  Moving slowly or fidgety/restless 0  0  3  Suicidal thoughts 0  0  0  PHQ-9 Score 6  0  17  Difficult doing work/chores Very difficult  Not difficult at all  Very difficult   .[name]    03/08/2023    3:07 PM 01/04/2022    4:23 PM 09/24/2021    1:30 PM 03/05/2021   11:26 AM  GAD 7 : Generalized Anxiety Score  Nervous, Anxious, on Edge 0 3 3 2   Control/stop worrying 0 3 3 2   Worry too much - different things 0 2 3 2   Trouble relaxing 2 3 3 2   Restless 3 3 3 2   Easily annoyed or irritable 1 3 1 2   Afraid - awful might happen 0 3 1 1   Total GAD 7 Score 6 20 17 13   Anxiety Difficulty Very difficult Very difficult Somewhat difficult Somewhat difficult      Physical Exam Constitutional:      Appearance: Normal appearance.  HENT:     Head: Normocephalic.  Cardiovascular:     Rate and Rhythm: Normal rate and regular rhythm.     Heart sounds: Murmur heard.  Pulmonary:     Effort: Pulmonary effort is normal.     Breath sounds: Normal breath sounds.  Musculoskeletal:     Right lower leg: No edema.     Left lower leg: No edema.  Neurological:     General: No focal deficit present.     Mental Status: She is alert and oriented to person, place, and time.  Psychiatric:        Mood and Affect: Mood normal.        The 10-year ASCVD risk score (Arnett DK, et al., 2019) is: 11.1%    Assessment & Plan:  Marland KitchenMarland KitchenViolet was seen today for follow-up.  Diagnoses and all orders for this visit:  Primary insomnia -     traZODone (DESYREL) 50 MG tablet; Take 1-2 tablets 30 minutes before bed.  Moderate episode of recurrent major depressive disorder (HCC)  Chronic pain syndrome -     traMADol (ULTRAM) 50 MG tablet; Take 2 tablets every 8-12 hours for moderate chronic pain.  Acquired hypothyroidism  GAD (generalized anxiety disorder)   Reviewed labs and refilled medications as needed.   Discussed pain and  to continue with tramadol increased to 2 tablets twice a day.  Pain contract updated.  Follow up in 3 months .Marland KitchenPDMP reviewed during this encounter.  Trial of trazodone for sleep.  Failed remeron. I would like to avoid ambien due to memory changes.    Return  in about 3 months (around 06/08/2023), or if symptoms worsen or fail to improve.    Tandy Gaw, PA-C

## 2023-03-10 NOTE — Telephone Encounter (Signed)
Unable to leave message, phone rings and then recording, "call can not be completed as dialed".

## 2023-03-13 ENCOUNTER — Telehealth: Payer: Self-pay | Admitting: General Practice

## 2023-03-13 NOTE — Telephone Encounter (Signed)
Patient was scheduled for her medicare wellness visit for 03/13/23 at 1 pm. Attempted to call patient x 3 and emergency contact.  Unable to get in touch with patient or leave voicemail on either phone numbers.  Will cancel appt for today to avoid no show fee.

## 2023-03-21 ENCOUNTER — Other Ambulatory Visit: Payer: Self-pay | Admitting: Physician Assistant

## 2023-03-21 MED ORDER — LINACLOTIDE 290 MCG PO CAPS: ORAL_CAPSULE | ORAL | 1 refills | Status: DC

## 2023-03-28 ENCOUNTER — Encounter: Payer: Self-pay | Admitting: Physician Assistant

## 2023-03-28 ENCOUNTER — Other Ambulatory Visit: Payer: Self-pay | Admitting: Physician Assistant

## 2023-03-28 DIAGNOSIS — F331 Major depressive disorder, recurrent, moderate: Secondary | ICD-10-CM

## 2023-03-29 ENCOUNTER — Other Ambulatory Visit: Payer: Self-pay | Admitting: Physician Assistant

## 2023-03-29 DIAGNOSIS — F411 Generalized anxiety disorder: Secondary | ICD-10-CM

## 2023-03-29 DIAGNOSIS — F5101 Primary insomnia: Secondary | ICD-10-CM

## 2023-03-29 DIAGNOSIS — F430 Acute stress reaction: Secondary | ICD-10-CM

## 2023-03-30 NOTE — Telephone Encounter (Signed)
Pt called.  She is requesting a refill on her Aricept.

## 2023-04-13 ENCOUNTER — Other Ambulatory Visit: Payer: Self-pay

## 2023-04-13 DIAGNOSIS — M545 Low back pain, unspecified: Secondary | ICD-10-CM

## 2023-04-13 DIAGNOSIS — G894 Chronic pain syndrome: Secondary | ICD-10-CM

## 2023-04-13 MED ORDER — CYCLOBENZAPRINE HCL 10 MG PO TABS
ORAL_TABLET | ORAL | 0 refills | Status: DC
Start: 2023-04-13 — End: 2023-07-03

## 2023-04-19 ENCOUNTER — Ambulatory Visit (INDEPENDENT_AMBULATORY_CARE_PROVIDER_SITE_OTHER): Payer: 59 | Admitting: Physician Assistant

## 2023-04-19 VITALS — BP 130/59 | HR 55 | Ht 64.0 in | Wt 122.1 lb

## 2023-04-19 DIAGNOSIS — J029 Acute pharyngitis, unspecified: Secondary | ICD-10-CM

## 2023-04-19 DIAGNOSIS — B9789 Other viral agents as the cause of diseases classified elsewhere: Secondary | ICD-10-CM

## 2023-04-19 DIAGNOSIS — R051 Acute cough: Secondary | ICD-10-CM | POA: Diagnosis not present

## 2023-04-19 DIAGNOSIS — E559 Vitamin D deficiency, unspecified: Secondary | ICD-10-CM

## 2023-04-19 DIAGNOSIS — K52831 Collagenous colitis: Secondary | ICD-10-CM | POA: Diagnosis not present

## 2023-04-19 DIAGNOSIS — F411 Generalized anxiety disorder: Secondary | ICD-10-CM

## 2023-04-19 DIAGNOSIS — J028 Acute pharyngitis due to other specified organisms: Secondary | ICD-10-CM | POA: Diagnosis not present

## 2023-04-19 LAB — POCT RAPID STREP A (OFFICE): Rapid Strep A Screen: NEGATIVE

## 2023-04-19 MED ORDER — METHYLPREDNISOLONE 4 MG PO TBPK: ORAL_TABLET | ORAL | 0 refills | Status: DC

## 2023-04-19 MED ORDER — BENZONATATE 200 MG PO CAPS: 200.0000 mg | ORAL_CAPSULE | Freq: Three times a day (TID) | ORAL | 0 refills | Status: DC | PRN

## 2023-04-19 NOTE — Patient Instructions (Signed)

## 2023-04-19 NOTE — Progress Notes (Signed)
Acute Office Visit  Subjective:     Patient ID: Rachael Douglas, female    DOB: 1956-08-12, 67 y.o.   MRN: [id number]  Chief Complaint  Patient presents with   Sore Throat    Started last night.It started scratchy and know it hurts to swallow. Niece had step last week.    HPI Patient is in today for sore throat that started last night. It is hard to swollen and neck hurts. No fever, chills, body aches. Her niece had strep last week. No sinus pressure or ear pain. She does have a dry cough.   .. Active Ambulatory Problems    Diagnosis Date Noted   Hypothyroidism 05/16/2014   Anxiety disorder 05/16/2014   Aortic heart valve narrowing 04/12/2013   DDD (degenerative disc disease), lumbar 05/16/2014   Acid reflux 05/16/2014   HLD (hyperlipidemia) 05/16/2014   Arteriosclerosis of coronary artery 08/30/2013   OP (osteoporosis) 05/16/2014   Major depressive disorder, recurrent episode (HCC) 05/16/2014   COPD, moderate (HCC) 09/25/2014   Stricture and stenosis of esophagus 10/23/2014   Aortic valve replaced 10/23/2014   Constipation 12/17/2014   Multiple gastric ulcers 09/05/2016   Collagenous colitis 09/09/2016   Primary insomnia 05/16/2017   Chronic prescription benzodiazepine use 07/04/2017   Left carotid bruit 08/15/2017   Actinic keratoses 12/03/2017   Pruritus 02/21/2018   Eczema 05/30/2018   Palpable mass of lower back 05/31/2018   Chronic bilateral low back pain without sciatica 05/31/2018   Easy bruising 09/12/2018   Family history of Alzheimer's disease 07/29/2019   Closed fracture of distal end of both radius and ulna with routine healing, subsequent encounter 03/09/2021   Memory changes 01/04/2022   At high risk for falls 01/04/2022   Psoriasis 01/21/2022   Iron deficiency anemia secondary to inadequate dietary iron intake 01/21/2022   Axillary mass, right 01/21/2022   Chronic pain syndrome 03/17/2022   Recurrent UTI 05/11/2022   Primary osteoarthritis of  right knee 05/11/2022   Acute stress reaction 09/28/2022   Urinary retention 09/28/2022   Hallucinations 11/16/2022   Delirium 11/16/2022   GAD (generalized anxiety disorder) 11/23/2022   Cellulitis of right hand 02/16/2023   Resolved Ambulatory Problems    Diagnosis Date Noted   Arthropathia 07/10/2008   Leg pain 12/04/2012   Anxiety and depression 05/16/2014   Gastric catarrh 08/30/2013   Complication of surgery 11/13/2015   Left foot pain 02/05/2016   Failure to attend appointment 05/05/2016   Diarrhea 08/01/2016   Right shoulder pain 11/23/2016   Left shoulder pain 11/23/2016   Trochanteric bursitis of both hips 12/07/2016   Rash and nonspecific skin eruption 07/04/2017   Ankle ligament laxity, right 05/28/2018   Anxiety 05/31/2018   Headache 02/26/2020   Past Medical History:  Diagnosis Date   Arthritis    Heart disease    Hyperlipidemia    Hypertension    PUD (peptic ulcer disease)    Thyroid disease      ROS See HPI.      Objective:    BP (!) 130/59   Pulse (!) 55   Ht 5\' 4"  (1.626 m)   Wt 122 lb 1.9 oz (55.4 kg)   SpO2 100%   BMI 20.96 kg/m  BP Readings from Last 3 Encounters:  04/19/23 (!) 130/59  03/08/23 (!) 111/57  02/16/23 127/72   Wt Readings from Last 3 Encounters:  04/19/23 122 lb 1.9 oz (55.4 kg)  03/08/23 123 lb (55.8 kg)  02/16/23 124 lb (  56.2 kg)   .[name] Results for orders placed or performed in visit on 04/19/23  POCT rapid strep A  Result Value Ref Range   Rapid Strep A Screen Negative Negative    Physical Exam HENT:     Head: Normocephalic.     Right Ear: Tympanic membrane and ear canal normal. No drainage, swelling or tenderness. No middle ear effusion. Tympanic membrane is not erythematous.     Left Ear: Tympanic membrane and ear canal normal. No drainage, swelling or tenderness.  No middle ear effusion. Tympanic membrane is not erythematous.     Mouth/Throat:     Mouth: Mucous membranes are moist.     Pharynx: Posterior  oropharyngeal erythema and uvula swelling present. No pharyngeal swelling or oropharyngeal exudate.     Tonsils: No tonsillar exudate or tonsillar abscesses.  Eyes:     Conjunctiva/sclera: Conjunctivae normal.  Cardiovascular:     Rate and Rhythm: Normal rate and regular rhythm.     Heart sounds: Murmur heard.  Musculoskeletal:     Cervical back: Normal range of motion.  Lymphadenopathy:     Cervical: Cervical adenopathy present.  Neurological:     General: No focal deficit present.     Mental Status: She is alert and oriented to person, place, and time.  Psychiatric:        Mood and Affect: Mood normal.          Assessment & Plan:  Marland KitchenMarland KitchenViolet was seen today for sore throat.  Diagnoses and all orders for this visit:  Acute pharyngitis, unspecified etiology -     methylPREDNISolone (MEDROL DOSEPAK) 4 MG TBPK tablet; Take as directed by package insert. -     POCT rapid strep A  Acute cough -     benzonatate (TESSALON) 200 MG capsule; Take 1 capsule (200 mg total) by mouth 3 (three) times daily as needed.  Vitamin D deficiency  Collagenous colitis  GAD (generalized anxiety disorder)  Sore throat (viral) -     POCT rapid strep A  Negative for strep Likely viral, start medrol dose pack Gargle with salt water Hot tea Tessalon for cough as needed If not improving or symptoms worsening follow up.    Tandy Gaw, PA-C

## 2023-04-20 ENCOUNTER — Other Ambulatory Visit: Payer: Self-pay

## 2023-04-20 DIAGNOSIS — J029 Acute pharyngitis, unspecified: Secondary | ICD-10-CM

## 2023-04-20 DIAGNOSIS — R051 Acute cough: Secondary | ICD-10-CM

## 2023-04-20 MED ORDER — METHYLPREDNISOLONE 4 MG PO TBPK
ORAL_TABLET | ORAL | 0 refills | Status: DC
Start: 2023-04-20 — End: 2023-07-31

## 2023-04-20 MED ORDER — BENZONATATE 200 MG PO CAPS
200.0000 mg | ORAL_CAPSULE | Freq: Three times a day (TID) | ORAL | 0 refills | Status: DC | PRN
Start: 2023-04-20 — End: 2023-07-31

## 2023-04-25 ENCOUNTER — Other Ambulatory Visit: Payer: Self-pay | Admitting: *Deleted

## 2023-04-25 DIAGNOSIS — K219 Gastro-esophageal reflux disease without esophagitis: Secondary | ICD-10-CM

## 2023-04-25 MED ORDER — PANTOPRAZOLE SODIUM 40 MG PO TBEC
40.0000 mg | DELAYED_RELEASE_TABLET | Freq: Every day | ORAL | 0 refills | Status: DC
Start: 2023-04-25 — End: 2023-05-25

## 2023-05-25 ENCOUNTER — Other Ambulatory Visit: Payer: Self-pay | Admitting: Physician Assistant

## 2023-05-25 DIAGNOSIS — F331 Major depressive disorder, recurrent, moderate: Secondary | ICD-10-CM

## 2023-05-25 DIAGNOSIS — F411 Generalized anxiety disorder: Secondary | ICD-10-CM

## 2023-05-25 DIAGNOSIS — K219 Gastro-esophageal reflux disease without esophagitis: Secondary | ICD-10-CM

## 2023-05-25 DIAGNOSIS — F5101 Primary insomnia: Secondary | ICD-10-CM

## 2023-05-25 DIAGNOSIS — G894 Chronic pain syndrome: Secondary | ICD-10-CM

## 2023-05-25 MED ORDER — PANTOPRAZOLE SODIUM 40 MG PO TBEC: 40.0000 mg | DELAYED_RELEASE_TABLET | Freq: Every day | ORAL | 0 refills | Status: DC

## 2023-05-25 MED ORDER — HYDROXYZINE HCL 25 MG PO TABS: 25.0000 mg | ORAL_TABLET | Freq: Every day | ORAL | 0 refills | Status: DC

## 2023-05-25 NOTE — Telephone Encounter (Signed)
Prescription Request  05/25/2023  LOV: 04/19/2023  What is the name of the medication or equipment? traMADol (ULTRAM) 50 MG tablet, DULoxetine (CYMBALTA) 60 MG capsule, pantoprazole (PROTONIX) 40 MG tablet, hydrOXYzine (ATARAX) 25 MG tablet  Have you contacted your pharmacy to request a refill? Yes   Which pharmacy would you like this sent to?    [facility] #[id number] - [place], [place] - [address] AT [address] [place] [address] Phone: [phone] Fax: [phone]    Patient notified that their request is being sent to the clinical staff for review and that they should receive a response within 2 business days.   Please advise at [facility] [phone]

## 2023-05-26 ENCOUNTER — Telehealth: Payer: Self-pay | Admitting: Physician Assistant

## 2023-05-26 MED ORDER — TRAMADOL HCL 50 MG PO TABS
ORAL_TABLET | ORAL | 0 refills | Status: DC
Start: 2023-05-26 — End: 2023-07-07

## 2023-05-26 MED ORDER — DULOXETINE HCL 60 MG PO CPEP
120.0000 mg | ORAL_CAPSULE | Freq: Every day | ORAL | 1 refills | Status: DC
Start: 2023-05-26 — End: 2023-06-27

## 2023-05-26 NOTE — Telephone Encounter (Signed)
Rx has been sent  

## 2023-05-26 NOTE — Telephone Encounter (Signed)
Patient called. She is requesting a rx refill of Tramadol. Also someone called her but she doesn't know who called. There is no note in her record.

## 2023-06-01 ENCOUNTER — Telehealth: Payer: Self-pay | Admitting: Physician Assistant

## 2023-06-01 ENCOUNTER — Other Ambulatory Visit: Payer: Self-pay | Admitting: Physician Assistant

## 2023-06-01 DIAGNOSIS — F5101 Primary insomnia: Secondary | ICD-10-CM

## 2023-06-01 NOTE — Telephone Encounter (Signed)
Patient called requesting a refill of ; traZODone (DESYREL) 50 MG tablet [[id number]]   Pharmacy ;  [facility] #[id number] - [place], [place] - [address] AT [address]

## 2023-06-02 MED ORDER — TRAZODONE HCL 50 MG PO TABS
ORAL_TABLET | ORAL | 0 refills | Status: DC
Start: 2023-06-02 — End: 2023-07-07

## 2023-06-02 NOTE — Telephone Encounter (Signed)
Attempted to inform pt of refill. Unable to leave a voicemail.

## 2023-06-05 ENCOUNTER — Other Ambulatory Visit: Payer: Self-pay

## 2023-06-05 DIAGNOSIS — F411 Generalized anxiety disorder: Secondary | ICD-10-CM

## 2023-06-05 DIAGNOSIS — M1711 Unilateral primary osteoarthritis, right knee: Secondary | ICD-10-CM

## 2023-06-05 MED ORDER — CELECOXIB 200 MG PO CAPS
ORAL_CAPSULE | ORAL | 0 refills | Status: DC
Start: 2023-06-05 — End: 2023-10-11

## 2023-06-05 MED ORDER — BUSPIRONE HCL 15 MG PO TABS: 15.0000 mg | ORAL_TABLET | Freq: Three times a day (TID) | ORAL | Status: DC

## 2023-06-05 NOTE — Telephone Encounter (Signed)
Reqeusting rx rf of bupirone 15mg    Last written 01/02/2023 Last OV 04/19/2023

## 2023-06-19 ENCOUNTER — Ambulatory Visit: Payer: 59 | Admitting: Physician Assistant

## 2023-06-21 ENCOUNTER — Other Ambulatory Visit: Payer: Self-pay | Admitting: Physician Assistant

## 2023-06-21 DIAGNOSIS — F331 Major depressive disorder, recurrent, moderate: Secondary | ICD-10-CM

## 2023-06-23 ENCOUNTER — Telehealth: Payer: Self-pay | Admitting: Physician Assistant

## 2023-06-23 NOTE — Telephone Encounter (Signed)
Patient called in stating that she needed ALL her medication refilled but she did not know the name of the medications because she threw the bottles away.[name]

## 2023-06-26 ENCOUNTER — Other Ambulatory Visit: Payer: Self-pay | Admitting: Physician Assistant

## 2023-06-26 DIAGNOSIS — R413 Other amnesia: Secondary | ICD-10-CM

## 2023-06-26 DIAGNOSIS — I2510 Atherosclerotic heart disease of native coronary artery without angina pectoris: Secondary | ICD-10-CM

## 2023-06-26 DIAGNOSIS — G894 Chronic pain syndrome: Secondary | ICD-10-CM

## 2023-06-26 DIAGNOSIS — S52509D Unspecified fracture of the lower end of unspecified radius, subsequent encounter for closed fracture with routine healing: Secondary | ICD-10-CM

## 2023-06-26 NOTE — Telephone Encounter (Signed)
Requesting multiple refills  Last OV [id number] No upcoming appt schld.

## 2023-06-27 MED ORDER — DONEPEZIL HCL 10 MG PO TABS: ORAL_TABLET | ORAL | 1 refills | Status: DC

## 2023-06-27 MED ORDER — POTASSIUM CHLORIDE ER 10 MEQ PO TBCR: 10.0000 meq | EXTENDED_RELEASE_TABLET | Freq: Every day | ORAL | 1 refills | Status: DC

## 2023-06-27 MED ORDER — FAMOTIDINE 40 MG PO TABS: ORAL_TABLET | ORAL | 3 refills | Status: DC

## 2023-06-27 MED ORDER — METOPROLOL TARTRATE 25 MG PO TABS
25.0000 mg | ORAL_TABLET | Freq: Two times a day (BID) | ORAL | 1 refills | Status: DC
Start: 2023-06-27 — End: 2023-11-01

## 2023-06-29 NOTE — Telephone Encounter (Signed)
Please call patient: We noticed that she requested refill for her tramadol I saw that Lesly Rubenstein had increased it earlier this summer but normally at last for more than 30 days but we had already received a request.  So wanted to see if there was something going on that she was using more medication.  And if she is using more medication then we may need to see her for an appointment.  Is also due for her 64-month follow-up for her chronic pain medication she was actually supposed to follow-up in September for that.  So please get her on the schedule.

## 2023-06-30 NOTE — Telephone Encounter (Signed)
Tried calling patient unable to contact phone is not working

## 2023-07-01 ENCOUNTER — Other Ambulatory Visit: Payer: Self-pay | Admitting: Family Medicine

## 2023-07-01 DIAGNOSIS — G894 Chronic pain syndrome: Secondary | ICD-10-CM

## 2023-07-01 DIAGNOSIS — G8929 Other chronic pain: Secondary | ICD-10-CM

## 2023-07-05 ENCOUNTER — Other Ambulatory Visit: Payer: Self-pay | Admitting: Physician Assistant

## 2023-07-05 DIAGNOSIS — R413 Other amnesia: Secondary | ICD-10-CM

## 2023-07-05 MED ORDER — DONEPEZIL HCL 10 MG PO TABS
ORAL_TABLET | ORAL | 1 refills | Status: DC
Start: 2023-07-05 — End: 2024-01-12

## 2023-07-06 ENCOUNTER — Other Ambulatory Visit: Payer: Self-pay | Admitting: Physician Assistant

## 2023-07-06 DIAGNOSIS — F5101 Primary insomnia: Secondary | ICD-10-CM

## 2023-07-06 DIAGNOSIS — G894 Chronic pain syndrome: Secondary | ICD-10-CM

## 2023-07-06 NOTE — Telephone Encounter (Signed)
Patient called stating she still has not received any refills for her medications. Patient does not know the name of the medications.

## 2023-07-07 NOTE — Telephone Encounter (Signed)
I have sent these. Key can we look into this?

## 2023-07-18 ENCOUNTER — Other Ambulatory Visit: Payer: Self-pay

## 2023-07-18 DIAGNOSIS — M5416 Radiculopathy, lumbar region: Secondary | ICD-10-CM

## 2023-07-18 MED ORDER — GABAPENTIN 600 MG PO TABS
1200.0000 mg | ORAL_TABLET | Freq: Three times a day (TID) | ORAL | 1 refills | Status: DC
Start: 2023-07-18 — End: 2024-02-02

## 2023-07-18 NOTE — Telephone Encounter (Signed)
Requesting rx rf of gabapentin  Last written 031/22/024 Last OV 04/19/2023  No upcoming appt schld.

## 2023-07-19 ENCOUNTER — Telehealth: Payer: Self-pay | Admitting: Physician Assistant

## 2023-07-19 DIAGNOSIS — E782 Mixed hyperlipidemia: Secondary | ICD-10-CM

## 2023-07-19 NOTE — Telephone Encounter (Signed)
Prescription Request  07/19/2023  LOV: 04/19/2023  What is the name of the medication or equipment? pravastatin (PRAVACHOL) 80 MG tablet   Have you contacted your pharmacy to request a refill? No   Which pharmacy would you like this sent to? [facility] Tele [phone] Fax [phone]   Patient notified that their request is being sent to the clinical staff for review and that they should receive a response within 2 business days.   Please advise at [phone]

## 2023-07-20 MED ORDER — PRAVASTATIN SODIUM 80 MG PO TABS: ORAL_TABLET | ORAL | 1 refills | Status: DC

## 2023-07-20 NOTE — Telephone Encounter (Signed)
Task completed

## 2023-07-27 ENCOUNTER — Telehealth: Payer: Self-pay | Admitting: Physician Assistant

## 2023-07-27 DIAGNOSIS — F411 Generalized anxiety disorder: Secondary | ICD-10-CM

## 2023-07-27 NOTE — Telephone Encounter (Signed)
[facility]'re [facility] a refill on Hydroxyzine 25mg  please submit to   [facility]   Phone number [phone] Fax number [phone]

## 2023-07-31 MED ORDER — HYDROXYZINE HCL 25 MG PO TABS
ORAL_TABLET | ORAL | 0 refills | Status: DC
Start: 2023-07-31 — End: 2023-10-06

## 2023-08-02 ENCOUNTER — Other Ambulatory Visit: Payer: Self-pay | Admitting: Physician Assistant

## 2023-08-02 DIAGNOSIS — F411 Generalized anxiety disorder: Secondary | ICD-10-CM

## 2023-08-05 ENCOUNTER — Ambulatory Visit
Admission: EM | Admit: 2023-08-05 | Discharge: 2023-08-05 | Disposition: A | Discharge status: Home / Self Care | Payer: Medicare HMO | Attending: Family Medicine | Admitting: Family Medicine

## 2023-08-05 ENCOUNTER — Other Ambulatory Visit: Payer: Self-pay

## 2023-08-05 DIAGNOSIS — J3489 Other specified disorders of nose and nasal sinuses: Secondary | ICD-10-CM | POA: Diagnosis not present

## 2023-08-05 DIAGNOSIS — J309 Allergic rhinitis, unspecified: Secondary | ICD-10-CM

## 2023-08-05 DIAGNOSIS — R059 Cough, unspecified: Secondary | ICD-10-CM | POA: Diagnosis not present

## 2023-08-05 DIAGNOSIS — J0100 Acute maxillary sinusitis, unspecified: Secondary | ICD-10-CM | POA: Diagnosis not present

## 2023-08-05 MED ORDER — PREDNISONE 20 MG PO TABS: ORAL_TABLET | ORAL | 0 refills | Status: DC

## 2023-08-05 MED ORDER — FEXOFENADINE HCL 180 MG PO TABS: 180.0000 mg | ORAL_TABLET | Freq: Every day | ORAL | 0 refills | Status: DC

## 2023-08-05 MED ORDER — DOXYCYCLINE HYCLATE 100 MG PO CAPS: 100.0000 mg | ORAL_CAPSULE | Freq: Two times a day (BID) | ORAL | 0 refills | Status: AC

## 2023-08-05 MED ORDER — BENZONATATE 200 MG PO CAPS: 200.0000 mg | ORAL_CAPSULE | Freq: Three times a day (TID) | ORAL | 0 refills | Status: AC | PRN

## 2023-08-05 MED ORDER — HYDROCODONE BIT-HOMATROP MBR 5-1.5 MG/5ML PO SOLN: 5.0000 mL | Freq: Four times a day (QID) | ORAL | 0 refills | Status: DC | PRN

## 2023-08-05 NOTE — ED Provider Notes (Signed)
[name]    CSN: [id number] Arrival date & time: 08/05/23  1524      History   Chief Complaint Chief Complaint  Patient presents with   Facial Pain    HPI Rachael Douglas is a 67 y.o. female.   HPI Very pleasant 67 year old female presents with sinus pressure, drainage from nose and ear discomfort for 1 week.  PMH significant for COPD, aortic valve replaced, and multiple gastric ulcers.  Patient is accompanied by her husband who is awaiting in our lobby today.  Past Medical History:  Diagnosis Date   Arthritis    Heart disease    Hyperlipidemia    Hypertension    PUD (peptic ulcer disease)    Thyroid disease     Patient Active Problem List   Diagnosis Date Noted   Cellulitis of right hand 02/16/2023   GAD (generalized anxiety disorder) 11/23/2022   Hallucinations 11/16/2022   Delirium 11/16/2022   Acute stress reaction 09/28/2022   Urinary retention 09/28/2022   Recurrent UTI 05/11/2022   Primary osteoarthritis of right knee 05/11/2022   Chronic pain syndrome 03/17/2022   Psoriasis 01/21/2022   Iron deficiency anemia secondary to inadequate dietary iron intake 01/21/2022   Axillary mass, right 01/21/2022   Memory changes 01/04/2022   At high risk for falls 01/04/2022   Closed fracture of distal end of both radius and ulna with routine healing, subsequent encounter 03/09/2021   Family history of Alzheimer's disease 07/29/2019   Easy bruising 09/12/2018   Palpable mass of lower back 05/31/2018   Chronic bilateral low back pain without sciatica 05/31/2018   Eczema 05/30/2018   Pruritus 02/21/2018   Actinic keratoses 12/03/2017   Left carotid bruit 08/15/2017   Chronic prescription benzodiazepine use 07/04/2017   Primary insomnia 05/16/2017   Collagenous colitis 09/09/2016   Multiple gastric ulcers 09/05/2016   Constipation 12/17/2014   Stricture and stenosis of esophagus 10/23/2014   Aortic valve replaced 10/23/2014   COPD, moderate (HCC)  09/25/2014   Hypothyroidism 05/16/2014   Anxiety disorder 05/16/2014   DDD (degenerative disc disease), lumbar 05/16/2014   Acid reflux 05/16/2014   HLD (hyperlipidemia) 05/16/2014   OP (osteoporosis) 05/16/2014   Major depressive disorder, recurrent episode (HCC) 05/16/2014   Arteriosclerosis of coronary artery 08/30/2013   Aortic heart valve narrowing 04/12/2013    Past Surgical History:  Procedure Laterality Date   AORTIC VALVE REPLACEMENT     TOTAL ABDOMINAL HYSTERECTOMY      OB History   No obstetric history on file.      Home Medications    Prior to Admission medications   Medication Sig Start Date End Date Taking? Authorizing Provider  benzonatate (TESSALON) 200 MG capsule Take 1 capsule (200 mg total) by mouth 3 (three) times daily as needed for up to 7 days. 08/05/23 08/12/23 Yes Trevor Iha, FNP  doxycycline (VIBRAMYCIN) 100 MG capsule Take 1 capsule (100 mg total) by mouth 2 (two) times daily for 10 days. 08/05/23 08/15/23 Yes Trevor Iha, FNP  fexofenadine [facility] ALLERGY) 180 MG tablet Take 1 tablet (180 mg total) by mouth daily for 15 days. 08/05/23 08/20/23 Yes Trevor Iha, FNP  HYDROcodone bit-homatropine (HYCODAN) 5-1.5 MG/5ML syrup Take 5 mLs by mouth every 6 (six) hours as needed for cough. 08/05/23  Yes Trevor Iha, FNP  predniSONE (DELTASONE) 20 MG tablet Take 3 tabs PO daily x 5 days. 08/05/23  Yes Trevor Iha, FNP  albuterol (VENTOLIN HFA) 108 (90 Base) MCG/ACT inhaler Inhale 1-2  puffs into the lungs every 6 (six) hours as needed for wheezing or shortness of breath. INHALE 2 PUFFS EVERY 6 HOURS AS NEEDED 08/05/22   Breeback, Jade L, PA-C  AMBULATORY NON FORMULARY MEDICATION In and out catheter and supplies to self catherize every night. 10/05/21   Breeback, Jade L, PA-C  ARIPiprazole (ABILIFY) 10 MG tablet TAKE 1 TABLET(10 MG) BY MOUTH DAILY 03/30/23   Breeback, Jade L, PA-C  busPIRone (BUSPAR) 15 MG tablet TAKE ONE TABLET (15 MG) BY MOUTH THREE  TIMES DAILY 08/04/23   Breeback, Jade L, PA-C  celecoxib (CELEBREX) 200 MG capsule One to 2 tablets by mouth daily as needed for pain. 06/05/23   Breeback, Jade L, PA-C  clobetasol ointment (TEMOVATE) 0.05 % Apply topically. 03/17/22   [provider]  cyclobenzaprine (FLEXERIL) 10 MG tablet TAKE 1 TABLET BY MOUTH DAILY AS NEEDED FOR BACK PAIN 07/03/23   Breeback, Jade L, PA-C  diclofenac Sodium (VOLTAREN) 1 % GEL Apply 4 g topically 4 (four) times daily. To affected joint. 11/29/22   Breeback, Jade L, PA-C  donepezil (ARICEPT) 10 MG tablet TAKE 1 TABLET(10 MG) BY MOUTH AT BEDTIME 07/05/23   Breeback, Jade L, PA-C  DULoxetine (CYMBALTA) 60 MG capsule TAKE 2 CAPSULES BY MOUTH DAILY 06/27/23   Breeback, Jade L, PA-C  famotidine (PEPCID) 40 MG tablet TAKE 1 TABLET(40 MG) BY MOUTH AT BEDTIME 06/27/23   Breeback, Jade L, PA-C  ferrous sulfate 325 (65 FE) MG EC tablet TAKE 1 TABLET(325 MG) BY MOUTH DAILY WITH BREAKFAST 10/19/22   Breeback, Jade L, PA-C  fluticasone (FLONASE) 50 MCG/ACT nasal spray Place 2 sprays into both nostrils daily. 09/23/22   Breeback, Lonna Cobb, PA-C  furosemide (LASIX) 40 MG tablet Take 1 tablet (40 MG) by 03/06/23   Breeback, Jade L, PA-C  gabapentin (NEURONTIN) 600 MG tablet Take 2 tablets (1,200 mg total) by mouth 3 (three) times daily. 07/18/23   Breeback, Jade L, PA-C  hydrOXYzine (ATARAX) 25 MG tablet Take one tablet at bedtime. 07/31/23   Jomarie Longs, PA-C  levothyroxine (SYNTHROID) 75 MCG tablet TAKE 1 TABLET(75 MCG) BY MOUTH DAILY BEFORE BREAKFAST 12/19/22   Breeback, Jade L, PA-C  linaclotide (LINZESS) 290 MCG CAPS capsule TAKE 1 CAPSULE(290 MCG) BY MOUTH DAILY BEFORE BREAKFAST 03/21/23   Breeback, Jade L, PA-C  metoprolol tartrate (LOPRESSOR) 25 MG tablet Take 1 tablet (25 mg total) by mouth 2 (two) times daily. 06/27/23   Breeback, Jade L, PA-C  ondansetron (ZOFRAN ODT) 4 MG disintegrating tablet Take 1 tablet (4 mg total) by mouth every 8 (eight) hours as needed for  nausea or vomiting. 09/23/22   Breeback, Jade L, PA-C  OTEZLA 30 MG TABS Take 1 tablet (30 mg total) by mouth 2 (two) times daily. 09/23/22   Breeback, Jade L, PA-C  pantoprazole (PROTONIX) 40 MG tablet Take 1 tablet (40 mg total) by mouth daily. 05/25/23   Breeback, Lesly Rubenstein L, PA-C  potassium chloride (KLOR-CON) 10 MEQ tablet Take 1 tablet (10 mEq total) by mouth daily. 06/27/23   Breeback, Jade L, PA-C  pravastatin (PRAVACHOL) 80 MG tablet TAKE 1 TABLET(80 MG) BY MOUTH DAILY 07/20/23   Breeback, Jade L, PA-C  SKYRIZI PEN 150 MG/ML SOAJ Inject into the skin. 05/20/22   [provider]  traMADol (ULTRAM) 50 MG tablet TAKE 2 TABLETS BY MOUTH EVERY 8 TO 12 HOURS FOR CHRONIC PAIN 07/07/23   Breeback, Jade L, PA-C  traZODone (DESYREL) 50 MG tablet TAKE 1 TO 2  TABLETS BY MOUTH EVERY NIGHT 30 MINUTES BEFORE BEDTIME 07/07/23   Breeback, Jade L, PA-C  triamcinolone (KENALOG) 0.1 % paste APPLY A THIN LAYER ON MOUTH ULCERS 2 TO 3 TIMES DAILY AFTER MEALS FOR 5 TO 7 DAYS 11/30/22   Breeback, Lesly Rubenstein L, PA-C  Vitamin D, Ergocalciferol, (DRISDOL) 1.25 MG (50000 UNIT) CAPS capsule Take by mouth. 01/07/21   [provider]    Family History Family History  Problem Relation Age of Onset   Cancer Other    Depression Other    Diabetes Other    Hypertension Other    Breast cancer Other     Social History Social History   Tobacco Use   Smoking status: Every Day    Current packs/day: 0.50    Types: Cigarettes, E-cigarettes    Last attempt to quit: 05/30/2014   Smokeless tobacco: Never  Vaping Use   Vaping status: Some Days   Substances: Nicotine  Substance Use Topics   Alcohol use: No    Alcohol/week: 0.0 standard drinks of alcohol   Drug use: No     Allergies   Penicillins, Ranitidine, Alendronate, and Bisphosphonates   Review of Systems Review of Systems  HENT:  Positive for congestion, postnasal drip, rhinorrhea and sinus pressure.   Respiratory:  Positive for cough.   All other systems  reviewed and are negative.    Physical Exam Triage Vital Signs ED Triage Vitals  Encounter Vitals Group     BP 08/05/23 1538 117/77     Systolic BP Percentile --      Diastolic BP Percentile --      Pulse Rate 08/05/23 1538 98     Resp 08/05/23 1538 16     Temp 08/05/23 1538 99 F (37.2 C)     Temp src --      SpO2 08/05/23 1538 98 %     Weight --      Height --      Head Circumference --      Peak Flow --      Pain Score 08/05/23 1537 4     Pain Loc --      Pain Education --      Exclude from Growth Chart --    No data found.  Updated Vital Signs BP 117/77   Pulse 98   Temp 99 F (37.2 C)   Resp 16   SpO2 98%    Physical Exam Vitals and nursing note reviewed.  Constitutional:      Appearance: Normal appearance. She is normal weight. She is ill-appearing.  HENT:     Head: Normocephalic and atraumatic.     Right Ear: External ear normal.     Left Ear: External ear normal.     Ears:     Comments: TMs are dull, cloudy, retracted bilaterally; significant eustachian tube dysfunction noted bilaterally    Nose:     Comments: Turbinates are erythematous/edematous    Mouth/Throat:     Mouth: Mucous membranes are moist.     Pharynx: Oropharynx is clear.  Eyes:     Extraocular Movements: Extraocular movements intact.     Conjunctiva/sclera: Conjunctivae normal.     Pupils: Pupils are equal, round, and reactive to light.  Cardiovascular:     Rate and Rhythm: Normal rate and regular rhythm.     Pulses: Normal pulses.     Heart sounds: Normal heart sounds.  Pulmonary:     Effort: Pulmonary effort is normal.  Breath sounds: Normal breath sounds. No wheezing, rhonchi or rales.     Comments: Infrequent nonproductive cough noted on exam Musculoskeletal:        General: Normal range of motion.     Cervical back: Normal range of motion and neck supple.  Skin:    General: Skin is warm and dry.  Neurological:     General: No focal deficit present.     Mental  Status: She is alert and oriented to person, place, and time. Mental status is at baseline.  Psychiatric:        Mood and Affect: Mood normal.        Behavior: Behavior normal.      UC Treatments / Results  Labs (all labs ordered are listed, but only abnormal results are displayed) Labs Reviewed - No data to display  EKG   Radiology No results found.  Procedures Procedures (including critical care time)  Medications Ordered in UC Medications - No data to display  Initial Impression / Assessment and Plan / UC Course  I have reviewed the triage vital signs and the nursing notes.  Pertinent labs & imaging results that were available during my care of the patient were reviewed by me and considered in my medical decision making (see chart for details).     MDM: 1.  Acute maxillary sinusitis, recurrence not specified-Rx'd Doxycycline 100 mg capsule: Take 1 capsule twice daily x 7 days; 2.  Sinus pressure-Rx'd prednisone 20 mg tablet: Take 3 tabs p.o. daily x 5 days; 3.  Allergic rhinitis, unspecified seasonality, unspecified trigger-Rx'd Allegra 180 mg (fexofenadine) daily x 5 days, as needed for postnasal drainage/drip; 4.  Cough, unspecified type-Rx'd Tessalon 200 mg capsule: Take 1 capsule 3 times daily, as needed for cough, Hycodan 5-1.5 mg / 5 mL syrup: Take 5 mL by mouth every 6 hours for cough. Advised patient to take medication as directed with food to completion.  Advised patient to take prednisone and Allegra with first dose of doxycycline for the next 5 of 7 days.  Advised may use Allegra as needed for postnasal drip/drainage.  Advised may use Tessalon capsules daily or as needed for cough.  Advised may use Hycodan cough syrup at night for cough prior to sleep due to sedate of effects.  Encouraged to increase daily water intake to 64 ounces per day while taking these medications.  Advised if symptoms worsen and/or unresolved please follow-up with PCP or here for further  evaluation.  Patient discharged home, hemodynamically stable. Final Clinical Impressions(s) / UC Diagnoses   Final diagnoses:  Acute maxillary sinusitis, recurrence not specified  Sinus pressure  Allergic rhinitis, unspecified seasonality, unspecified trigger  Cough, unspecified type     Discharge Instructions      Advised patient to take medication as directed with food to completion.  Advised patient to take prednisone and Allegra with first dose of doxycycline for the next 5 of 7 days.  Advised may use Allegra as needed for postnasal drip/drainage.  Advised may use Tessalon capsules daily or as needed for cough.  Advised may use Hycodan cough syrup at night for cough prior to sleep due to sedate of effects.  Encouraged to increase daily water intake to 64 ounces per day while taking these medications.  Advised if symptoms worsen and/or unresolved please follow-up with PCP or here for further evaluation.     ED Prescriptions     Medication Sig Dispense Auth. Provider   benzonatate (TESSALON) 200 MG capsule Take  1 capsule (200 mg total) by mouth 3 (three) times daily as needed for up to 7 days. 40 capsule Trevor Iha, FNP   predniSONE (DELTASONE) 20 MG tablet Take 3 tabs PO daily x 5 days. 15 tablet Trevor Iha, FNP   HYDROcodone bit-homatropine (HYCODAN) 5-1.5 MG/5ML syrup Take 5 mLs by mouth every 6 (six) hours as needed for cough. 120 mL Trevor Iha, FNP   fexofenadine [facility] ALLERGY) 180 MG tablet Take 1 tablet (180 mg total) by mouth daily for 15 days. 15 tablet Trevor Iha, FNP   doxycycline (VIBRAMYCIN) 100 MG capsule Take 1 capsule (100 mg total) by mouth 2 (two) times daily for 10 days. 20 capsule Trevor Iha, FNP      I have reviewed the PDMP during this encounter.   Trevor Iha, FNP 08/05/23 [phone]

## 2023-08-05 NOTE — Discharge Instructions (Addendum)
Advised patient to take medication as directed with food to completion.  Advised patient to take prednisone and Allegra with first dose of doxycycline for the next 5 of 7 days.  Advised may use Allegra as needed for postnasal drip/drainage.  Advised may use Tessalon capsules daily or as needed for cough.  Advised may use Hycodan cough syrup at night for cough prior to sleep due to sedate of effects.  Encouraged to increase daily water intake to 64 ounces per day while taking these medications.  Advised if symptoms worsen and/or unresolved please follow-up with PCP or here for further evaluation.

## 2023-08-05 NOTE — ED Triage Notes (Signed)
Pt presents to uc with co sinus pressure, drainage from nose and ear discomfort for 1 week. Pt has been using benadryl for symptoms.

## 2023-08-07 ENCOUNTER — Telehealth: Payer: Self-pay | Admitting: Physician Assistant

## 2023-08-07 ENCOUNTER — Other Ambulatory Visit: Payer: Self-pay | Admitting: Physician Assistant

## 2023-08-07 DIAGNOSIS — G894 Chronic pain syndrome: Secondary | ICD-10-CM

## 2023-08-07 MED ORDER — TRAMADOL HCL 50 MG PO TABS: ORAL_TABLET | ORAL | 0 refills | Status: DC

## 2023-08-07 NOTE — Telephone Encounter (Signed)
..  PDMP reviewed during this encounter. No concerns Refilled for one month Sent tramadol

## 2023-08-07 NOTE — Telephone Encounter (Signed)
Patient called to request refills on Tramadol 50mg  please submit to [facility] [place] Phone number [phone]

## 2023-08-09 ENCOUNTER — Ambulatory Visit (INDEPENDENT_AMBULATORY_CARE_PROVIDER_SITE_OTHER): Payer: Medicare HMO | Admitting: Physician Assistant

## 2023-08-09 ENCOUNTER — Encounter: Payer: Self-pay | Admitting: Physician Assistant

## 2023-08-09 VITALS — BP 132/60 | HR 86 | Temp 99.2°F | Ht 64.0 in | Wt 124.0 lb

## 2023-08-09 DIAGNOSIS — H9202 Otalgia, left ear: Secondary | ICD-10-CM | POA: Diagnosis not present

## 2023-08-09 DIAGNOSIS — J029 Acute pharyngitis, unspecified: Secondary | ICD-10-CM | POA: Diagnosis not present

## 2023-08-09 LAB — POCT RAPID STREP A (OFFICE): Rapid Strep A Screen: NEGATIVE

## 2023-08-09 MED ORDER — LIDOCAINE VISCOUS HCL 2 % MT SOLN: 15.0000 mL | OROMUCOSAL | 0 refills | Status: DC | PRN

## 2023-08-09 NOTE — Patient Instructions (Addendum)
Use your flonase!  Use lidocaine solution for sore throat.   Pharyngitis  Pharyngitis is inflammation of the throat (pharynx). It is a very common cause of sore throat. Pharyngitis can be caused by a bacteria, but it is usually caused by a virus. Most cases of pharyngitis get better on their own without treatment. What are the causes? This condition may be caused by: Infection by viruses (viral). Viral pharyngitis spreads easily from person to person (is contagious) through coughing, sneezing, and sharing of personal items or utensils such as cups, forks, spoons, and toothbrushes. Infection by bacteria (bacterial). Bacterial pharyngitis may be spread by touching the nose or face after coming in contact with the bacteria, or through close contact, such as kissing. Allergies. Allergies can cause buildup of mucus in the throat (post-nasal drip), leading to inflammation and irritation. Allergies can also cause blocked nasal passages, forcing breathing through the mouth, which dries and irritates the throat. What increases the risk? You are more likely to develop this condition if: You are 101-61 years old. You are exposed to crowded environments such as daycare, school, or dormitory living. You live in a cold climate. You have a weakened disease-fighting (immune) system. What are the signs or symptoms? Symptoms of this condition vary by the cause. Common symptoms of this condition include: Sore throat. Fatigue. Low-grade fever. Stuffy nose (nasal congestion) and cough. Headache. Other symptoms may include: Glands in the neck (lymph nodes) that are swollen. Skin rashes. Plaque-like film on the throat or tonsils. This is often a symptom of bacterial pharyngitis. Vomiting. Red, itchy eyes (conjunctivitis). Loss of appetite. Joint pain and muscle aches. Enlarged tonsils. How is this diagnosed? This condition may be diagnosed based on your medical history and a physical exam. Your health care  provider will ask you questions about your illness and your symptoms. A swab of your throat may be done to check for bacteria (rapid strep test). Other lab tests may also be done, depending on the suspected cause, but these are rare. How is this treated? Many times, treatment is not needed for this condition. Pharyngitis usually gets better in 3-4 days without treatment. Bacterial pharyngitis may be treated with antibiotic medicines. Follow these instructions at home: Medicines Take over-the-counter and prescription medicines only as told by your health care provider. If you were prescribed an antibiotic medicine, take it as told by your health care provider. Do not stop taking the antibiotic even if you start to feel better. Use throat sprays to soothe your throat as told by your health care provider. Children can get pharyngitis. Do not give your child aspirin because of the association with Reye's syndrome. Managing pain To help with pain, try: Sipping warm liquids, such as broth, herbal tea, or warm water. Eating or drinking cold or frozen liquids, such as frozen ice pops. Gargling with a mixture of salt and water 3-4 times a day or as needed. To make salt water, completely dissolve -1 tsp (3-6 g) of salt in 1 cup (237 mL) of warm water. Sucking on hard candy or throat lozenges. Putting a cool-mist humidifier in your bedroom at night to moisten the air. Sitting in the bathroom with the door closed for 5-10 minutes while you run hot water in the shower.  General instructions  Do not use any products that contain nicotine or tobacco. These products include cigarettes, chewing tobacco, and vaping devices, such as e-cigarettes. If you need help quitting, ask your health care provider. Rest as told by your  health care provider. Drink enough fluid to keep your urine pale yellow. How is this prevented? To help prevent becoming infected or spreading infection: Wash your hands often with soap  and water for at least 20 seconds. If soap and water are not available, use hand sanitizer. Do not touch your eyes, nose, or mouth with unwashed hands, and wash hands after touching these areas. Do not share cups or eating utensils. Avoid close contact with people who are sick. Contact a health care provider if: You have large, tender lumps in your neck. You have a rash. You cough up green, yellow-brown, or bloody mucus. Get help right away if: Your neck becomes stiff. You drool or are unable to swallow liquids. You cannot drink or take medicines without vomiting. You have severe pain that does not go away, even after you take medicine. You have trouble breathing, and it is not caused by a stuffy nose. You have new pain and swelling in your joints such as the knees, ankles, wrists, or elbows. These symptoms may represent a serious problem that is an emergency. Do not wait to see if the symptoms will go away. Get medical help right away. Call your local emergency services (911 in the [place] Do not drive yourself to the hospital. Summary Pharyngitis is redness, pain, and swelling (inflammation) of the throat (pharynx). While pharyngitis can be caused by a bacteria, the most common causes are viral. Most cases of pharyngitis get better on their own without treatment. Bacterial pharyngitis is treated with antibiotic medicines. This information is not intended to replace advice given to you by your health care provider. Make sure you discuss any questions you have with your health care provider. Document Revised: 12/02/2020 Document Reviewed: 12/02/2020 Elsevier Patient Education  2024 [employer].

## 2023-08-09 NOTE — Progress Notes (Signed)
Acute Office Visit  Subjective:     Patient ID: Rachael Douglas, female    DOB: 1955-09-28, 67 y.o.   MRN: [id number]  Chief Complaint  Patient presents with   Ear Pain    Sinus congestion ear pain and throat pain    HPI Patient is in today for persistent left ear pain ,cough, and sore throat. She saw ED on 11/16 and they gave her doxy and prednisone for sinus infection. No testing was performed. She has not yet finished these medications. Her cough is worst at night. Her throat is very sore and feels like her neck is swollen.   .. Active Ambulatory Problems    Diagnosis Date Noted   Hypothyroidism 05/16/2014   Anxiety disorder 05/16/2014   Aortic heart valve narrowing 04/12/2013   DDD (degenerative disc disease), lumbar 05/16/2014   Acid reflux 05/16/2014   HLD (hyperlipidemia) 05/16/2014   Arteriosclerosis of coronary artery 08/30/2013   OP (osteoporosis) 05/16/2014   Major depressive disorder, recurrent episode (HCC) 05/16/2014   COPD, moderate (HCC) 09/25/2014   Stricture and stenosis of esophagus 10/23/2014   Aortic valve replaced 10/23/2014   Constipation 12/17/2014   Multiple gastric ulcers 09/05/2016   Collagenous colitis 09/09/2016   Primary insomnia 05/16/2017   Chronic prescription benzodiazepine use 07/04/2017   Left carotid bruit 08/15/2017   Actinic keratoses 12/03/2017   Pruritus 02/21/2018   Eczema 05/30/2018   Palpable mass of lower back 05/31/2018   Chronic bilateral low back pain without sciatica 05/31/2018   Easy bruising 09/12/2018   Family history of Alzheimer's disease 07/29/2019   Closed fracture of distal end of both radius and ulna with routine healing, subsequent encounter 03/09/2021   Memory changes 01/04/2022   At high risk for falls 01/04/2022   Psoriasis 01/21/2022   Iron deficiency anemia secondary to inadequate dietary iron intake 01/21/2022   Axillary mass, right 01/21/2022   Chronic pain syndrome 03/17/2022   Recurrent UTI  05/11/2022   Primary osteoarthritis of right knee 05/11/2022   Acute stress reaction 09/28/2022   Urinary retention 09/28/2022   Hallucinations 11/16/2022   Delirium 11/16/2022   GAD (generalized anxiety disorder) 11/23/2022   Cellulitis of right hand 02/16/2023   Resolved Ambulatory Problems    Diagnosis Date Noted   Arthropathia 07/10/2008   Leg pain 12/04/2012   Anxiety and depression 05/16/2014   Gastric catarrh 08/30/2013   Complication of surgery 11/13/2015   Left foot pain 02/05/2016   Failure to attend appointment 05/05/2016   Diarrhea 08/01/2016   Right shoulder pain 11/23/2016   Left shoulder pain 11/23/2016   Trochanteric bursitis of both hips 12/07/2016   Rash and nonspecific skin eruption 07/04/2017   Ankle ligament laxity, right 05/28/2018   Anxiety 05/31/2018   Headache 02/26/2020   Past Medical History:  Diagnosis Date   Arthritis    Heart disease    Hyperlipidemia    Hypertension    PUD (peptic ulcer disease)    Thyroid disease      Review of Systems  Constitutional:  Negative for chills and fever.  HENT:  Positive for ear pain and sore throat.   Respiratory:  Positive for cough.         Objective:    BP 132/60   Pulse 86   Temp 99.2 F (37.3 C) (Oral)   Ht 5\' 4"  (1.626 m)   Wt 124 lb 0.6 oz (56.3 kg)   SpO2 99%   BMI 21.29 kg/m  BP Readings from  Last 3 Encounters:  08/09/23 132/60  08/05/23 117/77  04/19/23 (!) 130/59   Wt Readings from Last 3 Encounters:  08/09/23 124 lb 0.6 oz (56.3 kg)  04/19/23 122 lb 1.9 oz (55.4 kg)  03/08/23 123 lb (55.8 kg)      Physical Exam HENT:     Right Ear: Hearing, tympanic membrane, ear canal and external ear normal. No middle ear effusion.     Left Ear: Hearing, ear canal and external ear normal. A middle ear effusion is present.     Mouth/Throat:     Pharynx: Posterior oropharyngeal erythema present. No pharyngeal swelling or oropharyngeal exudate.     Tonsils: No tonsillar exudate.   Cardiovascular:     Rate and Rhythm: Normal rate and regular rhythm.     Pulses: Normal pulses.     Heart sounds: Murmur (holosystolic) heard.  Pulmonary:     Effort: Pulmonary effort is normal.     Breath sounds: Normal breath sounds.     Results for orders placed or performed in visit on 08/09/23  POCT rapid strep A  Result Value Ref Range   Rapid Strep A Screen Negative Negative        Assessment & Plan:  Marland KitchenMarland KitchenViolet was seen today for ear pain.  Diagnoses and all orders for this visit:  Acute pharyngitis, unspecified etiology -     lidocaine (XYLOCAINE) 2 % solution; Use as directed 15 mLs in the mouth or throat every 3 (three) hours as needed (mouth/throat pain - gargle and spit).  Left ear pain -     POCT rapid strep A    Recommended Flonase for middle ear effusion. Finish doxy and prednisone. Reassured patient that lungs sounded good and no overt signs of infection Lidocaine solution for sore throat. Instructed patient to swish, gargle, then spit out the solution and to not swallow the solution.  Recommended honey, lozenges, and hot tea for sore throat.  Recommended rest. If not better by Friday, advised patient to contact office.  Return if symptoms worsen or fail to improve.  Tandy Gaw, PA-C

## 2023-08-10 ENCOUNTER — Telehealth: Payer: Self-pay

## 2023-08-10 ENCOUNTER — Telehealth: Payer: Self-pay | Admitting: Physician Assistant

## 2023-08-10 NOTE — Telephone Encounter (Addendum)
Initiated Prior authorization ZOX:WRUEAVWU HCl 50MG  tablets Via: Covermymeds Case/Key:[id number] Status: approved as of  08/10/23 Reason:Authorization Expiration Date: 09/17/2024  Notified Pt via: Mychart

## 2023-08-10 NOTE — Telephone Encounter (Signed)
Patient called in stating pharmacy will not allow her to get her tramadol. Needs PA

## 2023-08-15 ENCOUNTER — Other Ambulatory Visit: Payer: Self-pay | Admitting: Physician Assistant

## 2023-08-15 DIAGNOSIS — F5101 Primary insomnia: Secondary | ICD-10-CM

## 2023-08-21 ENCOUNTER — Other Ambulatory Visit: Payer: Self-pay | Admitting: Physician Assistant

## 2023-08-21 DIAGNOSIS — F411 Generalized anxiety disorder: Secondary | ICD-10-CM

## 2023-08-29 ENCOUNTER — Ambulatory Visit (INDEPENDENT_AMBULATORY_CARE_PROVIDER_SITE_OTHER): Payer: Medicare HMO | Admitting: Physician Assistant

## 2023-08-29 ENCOUNTER — Encounter: Payer: Self-pay | Admitting: Physician Assistant

## 2023-08-29 VITALS — BP 116/59 | HR 88 | Resp 12 | Ht 64.0 in | Wt 122.2 lb

## 2023-08-29 DIAGNOSIS — J329 Chronic sinusitis, unspecified: Secondary | ICD-10-CM | POA: Diagnosis not present

## 2023-08-29 DIAGNOSIS — E039 Hypothyroidism, unspecified: Secondary | ICD-10-CM | POA: Diagnosis not present

## 2023-08-29 DIAGNOSIS — Z0000 Encounter for general adult medical examination without abnormal findings: Secondary | ICD-10-CM | POA: Diagnosis not present

## 2023-08-29 DIAGNOSIS — J40 Bronchitis, not specified as acute or chronic: Secondary | ICD-10-CM

## 2023-08-29 DIAGNOSIS — E559 Vitamin D deficiency, unspecified: Secondary | ICD-10-CM | POA: Diagnosis not present

## 2023-08-29 DIAGNOSIS — M810 Age-related osteoporosis without current pathological fracture: Secondary | ICD-10-CM

## 2023-08-29 DIAGNOSIS — I2510 Atherosclerotic heart disease of native coronary artery without angina pectoris: Secondary | ICD-10-CM | POA: Diagnosis not present

## 2023-08-29 DIAGNOSIS — F17200 Nicotine dependence, unspecified, uncomplicated: Secondary | ICD-10-CM | POA: Diagnosis not present

## 2023-08-29 DIAGNOSIS — Z79899 Other long term (current) drug therapy: Secondary | ICD-10-CM | POA: Diagnosis not present

## 2023-08-29 MED ORDER — DOXYCYCLINE HYCLATE 100 MG PO TABS: 100.0000 mg | ORAL_TABLET | Freq: Two times a day (BID) | ORAL | 0 refills | Status: DC

## 2023-08-29 NOTE — Patient Instructions (Addendum)
Start doxycycline for sinobronchitis   Health Maintenance After Age 68 After age 26, you are at a higher risk for certain long-term diseases and infections as well as injuries from falls. Falls are a major cause of broken bones and head injuries in people who are older than age 4. Getting regular preventive care can help to keep you healthy and well. Preventive care includes getting regular testing and making lifestyle changes as recommended by your health care provider. Talk with your health care provider about: Which screenings and tests you should have. A screening is a test that checks for a disease when you have no symptoms. A diet and exercise plan that is right for you. What should I know about screenings and tests to prevent falls? Screening and testing are the best ways to find a health problem early. Early diagnosis and treatment give you the best chance of managing medical conditions that are common after age 55. Certain conditions and lifestyle choices may make you more likely to have a fall. Your health care provider may recommend: Regular vision checks. Poor vision and conditions such as cataracts can make you more likely to have a fall. If you wear glasses, make sure to get your prescription updated if your vision changes. Medicine review. Work with your health care provider to regularly review all of the medicines you are taking, including over-the-counter medicines. Ask your health care provider about any side effects that may make you more likely to have a fall. Tell your health care provider if any medicines that you take make you feel dizzy or sleepy. Strength and balance checks. Your health care provider may recommend certain tests to check your strength and balance while standing, walking, or changing positions. Foot health exam. Foot pain and numbness, as well as not wearing proper footwear, can make you more likely to have a fall. Screenings, including: Osteoporosis screening.  Osteoporosis is a condition that causes the bones to get weaker and break more easily. Blood pressure screening. Blood pressure changes and medicines to control blood pressure can make you feel dizzy. Depression screening. You may be more likely to have a fall if you have a fear of falling, feel depressed, or feel unable to do activities that you used to do. Alcohol use screening. Using too much alcohol can affect your balance and may make you more likely to have a fall. Follow these instructions at home: Lifestyle Do not drink alcohol if: Your health care provider tells you not to drink. If you drink alcohol: Limit how much you have to: 0-1 drink a day for women. 0-2 drinks a day for men. Know how much alcohol is in your drink. In the [place] one drink equals one 12 oz bottle of beer (355 mL), one 5 oz glass of wine (148 mL), or one 1 oz glass of hard liquor (44 mL). Do not use any products that contain nicotine or tobacco. These products include cigarettes, chewing tobacco, and vaping devices, such as e-cigarettes. If you need help quitting, ask your health care provider. Activity  Follow a regular exercise program to stay fit. This will help you maintain your balance. Ask your health care provider what types of exercise are appropriate for you. If you need a cane or walker, use it as recommended by your health care provider. Wear supportive shoes that have nonskid soles. Safety  Remove any tripping hazards, such as rugs, cords, and clutter. Install safety equipment such as grab bars in bathrooms and safety rails  on stairs. Keep rooms and walkways well-lit. General instructions Talk with your health care provider about your risks for falling. Tell your health care provider if: You fall. Be sure to tell your health care provider about all falls, even ones that seem minor. You feel dizzy, tiredness (fatigue), or off-balance. Take over-the-counter and prescription medicines only as told by  your health care provider. These include supplements. Eat a healthy diet and maintain a healthy weight. A healthy diet includes low-fat dairy products, low-fat (lean) meats, and fiber from whole grains, beans, and lots of fruits and vegetables. Stay current with your vaccines. Schedule regular health, dental, and eye exams. Summary Having a healthy lifestyle and getting preventive care can help to protect your health and wellness after age 31. Screening and testing are the best way to find a health problem early and help you avoid having a fall. Early diagnosis and treatment give you the best chance for managing medical conditions that are more common for people who are older than age 43. Falls are a major cause of broken bones and head injuries in people who are older than age 74. Take precautions to prevent a fall at home. Work with your health care provider to learn what changes you can make to improve your health and wellness and to prevent falls. This information is not intended to replace advice given to you by your health care provider. Make sure you discuss any questions you have with your health care provider. Document Revised: 01/25/2021 Document Reviewed: 01/25/2021 Elsevier Patient Education  2024 [employer].

## 2023-08-29 NOTE — Progress Notes (Unsigned)
Acute Office Visit  Subjective:     Patient ID: Rachael Douglas, female    DOB: 03-20-1956, 67 y.o.   MRN: [id number]  Chief Complaint  Patient presents with   Annual Exam    HPI Patient is in today for persistent infection. She came to our office on 11/20 for sore throat and left ear pain and we managed her conservatively. Her symptoms persisted so she went to UC who gave her antibiotics and steroids. She finished the antibiotics ~2 wks ago. She felt better for about 1 wk then starting feeling sick again. She feels body aches, left ear ache with blowing nose, sore throat, and has a productive cough with green mucus. She sometimes feels difficulty with breathing. PMH chronic tobacco use. She smokes ~1/2 pack/day. She is not using any nasal spray.  She denies any other concerns.   Review of Systems  Constitutional:  Negative for fever.  HENT:  Positive for ear pain and sore throat. Negative for congestion.   Respiratory:  Positive for cough, sputum production and shortness of breath.   Cardiovascular:  Negative for leg swelling.      Objective:    BP (!) 116/59 (BP Location: Left Arm, Patient Position: Sitting)   Pulse 88   Resp 12   Ht 5\' 4"  (1.626 m)   Wt 122 lb 3.2 oz (55.4 kg)   SpO2 96%   BMI 20.98 kg/m    Physical Exam HENT:     Right Ear: Tympanic membrane, ear canal and external ear normal.     Left Ear: Tympanic membrane, ear canal and external ear normal.     Nose: Nose normal.     Mouth/Throat:     Pharynx: No oropharyngeal exudate or posterior oropharyngeal erythema.     Comments: PND present Cardiovascular:     Rate and Rhythm: Normal rate and regular rhythm.     Pulses: Normal pulses.     Heart sounds: Murmur heard.  Pulmonary:     Effort: Pulmonary effort is normal.     Comments: Coarse breath sounds Abdominal:     Tenderness: There is no abdominal tenderness. There is no guarding or rebound.  Musculoskeletal:        General: No swelling.         Assessment & Plan:  Marland KitchenMarland KitchenViolet was seen today for annual exam.  Diagnoses and all orders for this visit:  Annual physical exam -     CBC w/Diff/Platelet -     CMP14+EGFR -     Lipid panel -     TSH + free T4 -     VITAMIN D 25 Hydroxy (Vit-D Deficiency, Fractures)  Current smoker -     Ambulatory Referral Lung Cancer Screening [facility]  Vitamin D deficiency -     VITAMIN D 25 Hydroxy (Vit-D Deficiency, Fractures)  Acquired hypothyroidism -     TSH + free T4  Age-related osteoporosis without current pathological fracture -     VITAMIN D 25 Hydroxy (Vit-D Deficiency, Fractures)  Arteriosclerosis of coronary artery -     Lipid panel  Medication management -     CBC w/Diff/Platelet -     CMP14+EGFR -     Lipid panel -     TSH + free T4 -     VITAMIN D 25 Hydroxy (Vit-D Deficiency, Fractures)  Sinobronchitis -     doxycycline (VIBRA-TABS) 100 MG tablet; Take 1 tablet (100 mg total) by mouth 2 (two) times daily.  Discussed with patient that smoking makes it difficult for body to clear respiratory infections. Length of illness and green mucus more consistent with bacterial respiratory infection. Recommended Flonase. Doxycycline prescribed. Consider daily inhaler if she does not get better. Lung cancer screening referral placed  Fasting labs obtained.  Referral for colonoscopy and mammogram. Received flu and COVID vaccines 1 mo ago. Received Shingles vaccines at pharmacy.   Return if symptoms worsen or fail to improve.  Tandy Gaw, PA-C

## 2023-08-30 ENCOUNTER — Encounter: Payer: Self-pay | Admitting: Physician Assistant

## 2023-08-30 LAB — LIPID PANEL
Chol/HDL Ratio: 3.2 {ratio} (ref 0.0–4.4)
Cholesterol, Total: 199 mg/dL (ref 100–199)
HDL: 63 mg/dL (ref >39)
LDL Chol Calc (NIH): 115 mg/dL — ABNORMAL HIGH (ref 0–99)
Triglycerides: 117 mg/dL (ref 0–149)
VLDL Cholesterol Cal: 21 mg/dL (ref 5–40)

## 2023-08-30 LAB — CMP14+EGFR
ALT: 13 [IU]/L (ref 0–32)
AST: 18 [IU]/L (ref 0–40)
Albumin: 4.3 g/dL (ref 3.9–4.9)
Alkaline Phosphatase: 73 [IU]/L (ref 44–121)
BUN/Creatinine Ratio: 13 (ref 12–28)
BUN: 12 mg/dL (ref 8–27)
Bilirubin Total: 0.3 mg/dL (ref 0.0–1.2)
CO2: 26 mmol/L (ref 20–29)
Calcium: 8.8 mg/dL (ref 8.7–10.3)
Chloride: 99 mmol/L (ref 96–106)
Creatinine, Ser: 0.92 mg/dL (ref 0.57–1.00)
Globulin, Total: 2.1 g/dL (ref 1.5–4.5)
Glucose: 98 mg/dL (ref 70–99)
Potassium: 3.9 mmol/L (ref 3.5–5.2)
Sodium: 141 mmol/L (ref 134–144)
Total Protein: 6.4 g/dL (ref 6.0–8.5)
eGFR: 68 mL/min/{1.73_m2} (ref >59)

## 2023-08-30 LAB — CBC WITH DIFFERENTIAL/PLATELET
Basophils Absolute: 0 10*3/uL (ref 0.0–0.2)
Basos: 0 %
EOS (ABSOLUTE): 0 10*3/uL (ref 0.0–0.4)
Eos: 1 %
Hematocrit: 43.8 % (ref 34.0–46.6)
Hemoglobin: 14.9 g/dL (ref 11.1–15.9)
Immature Grans (Abs): 0 10*3/uL (ref 0.0–0.1)
Immature Granulocytes: 0 %
Lymphocytes Absolute: 1.2 10*3/uL (ref 0.7–3.1)
Lymphs: 20 %
MCH: 31.4 pg (ref 26.6–33.0)
MCHC: 34 g/dL (ref 31.5–35.7)
MCV: 92 fL (ref 79–97)
Monocytes Absolute: 0.3 10*3/uL (ref 0.1–0.9)
Monocytes: 4 %
Neutrophils Absolute: 4.7 10*3/uL (ref 1.4–7.0)
Neutrophils: 75 %
Platelets: 209 10*3/uL (ref 150–450)
RBC: 4.74 x10E6/uL (ref 3.77–5.28)
RDW: 12.4 % (ref 11.7–15.4)
WBC: 6.3 10*3/uL (ref 3.4–10.8)

## 2023-08-30 LAB — TSH+FREE T4
Free T4: 1.65 ng/dL (ref 0.82–1.77)
TSH: 2.6 u[IU]/mL (ref 0.450–4.500)

## 2023-08-30 LAB — VITAMIN D 25 HYDROXY (VIT D DEFICIENCY, FRACTURES): Vit D, 25-Hydroxy: 35.5 ng/mL (ref 30.0–100.0)

## 2023-08-30 NOTE — Progress Notes (Signed)
Emmaly,   No anemia! Hemoglobin looks good.   Kidney, liver, glucose looks good.   Thyroid looks good.   Vitamin D looks better.   Stay on Pravachol.   [name].The 10-year ASCVD risk score (Arnett DK, et al., 2019) is: 13.6%   Values used to calculate the score:     Age: 67 years     Sex: Female     Is Non-Hispanic African American: No     Diabetic: No     Tobacco smoker: Yes     Systolic Blood Pressure: 116 mmHg     Is BP treated: Yes     HDL Cholesterol: 63 mg/dL     Total Cholesterol: 199 mg/dL

## 2023-09-06 ENCOUNTER — Other Ambulatory Visit: Payer: Self-pay

## 2023-09-06 DIAGNOSIS — Z122 Encounter for screening for malignant neoplasm of respiratory organs: Secondary | ICD-10-CM

## 2023-09-06 DIAGNOSIS — F17210 Nicotine dependence, cigarettes, uncomplicated: Secondary | ICD-10-CM

## 2023-09-06 DIAGNOSIS — Z87891 Personal history of nicotine dependence: Secondary | ICD-10-CM

## 2023-09-08 ENCOUNTER — Ambulatory Visit (INDEPENDENT_AMBULATORY_CARE_PROVIDER_SITE_OTHER): Payer: Medicare HMO | Admitting: Acute Care

## 2023-09-08 DIAGNOSIS — F17210 Nicotine dependence, cigarettes, uncomplicated: Secondary | ICD-10-CM

## 2023-09-08 NOTE — Progress Notes (Addendum)
 Provider Attestation I agree with the documentation of the Shared Decision Making visit,  smoking cessation counseling if appropriate, and verification or eligibility for lung cancer screening as documented by the RN Nurse Navigator.   Raejean Bullock, MSN, AGACNP-BC [facility] See Amion for personal pager PCCM on call pager [phone]     Virtual Visit via Telephone Note  I connected with Elvera A Ontko on 09/08/23 at 11:00 AM EST by telephone and verified that I am speaking with the correct person using two identifiers.  Location: Patient: Rachael Douglas Provider: Alyse Bach, RN   I discussed the limitations, risks, security and privacy concerns of performing an evaluation and management service by telephone and the availability of in person appointments. I also discussed with the patient that there may be a patient responsible charge related to this service. The patient expressed understanding and agreed to proceed.   Shared Decision Making Visit Lung Cancer Screening Program [phone])   Eligibility: Age 67 y.o. Pack Years Smoking History Calculation 52 (# packs/per year x # years smoked) Recent History of coughing up blood  no Unexplained weight loss? no ( >Than 15 pounds within the last 6 months ) Prior History Lung / other cancer no (Diagnosis within the last 5 years already requiring surveillance chest CT Scans). Smoking Status Current Smoker Former Smokers: Years since quit: n/a  Quit Date: n/a  Visit Components: Discussion included one or more decision making aids. yes Discussion included risk/benefits of screening. yes Discussion included potential follow up diagnostic testing for abnormal scans. yes Discussion included meaning and risk of over diagnosis. yes Discussion included meaning and risk of False Positives. yes Discussion included meaning of total radiation exposure. yes  Counseling Included: Importance of adherence  to annual lung cancer LDCT screening. yes Impact of comorbidities on ability to participate in the program. yes Ability and willingness to under diagnostic treatment. yes  Smoking Cessation Counseling: Current Smokers:  Discussed importance of smoking cessation. yes Information about tobacco cessation classes and interventions provided to patient. yes Patient provided with "ticket" for LDCT Scan. no Symptomatic Patient. no  Counseling(Intermediate counseling: > three minutes) 99406 Diagnosis Code: Tobacco Use Z72.0 Asymptomatic Patient yes  Counseling (Intermediate counseling: > three minutes counseling) B1478 Former Smokers:  Discussed the importance of maintaining cigarette abstinence. yes Diagnosis Code: Personal History of Nicotine Dependence. G95.621 Information about tobacco cessation classes and interventions provided to patient. Yes Patient provided with "ticket" for LDCT Scan. no Written Order for Lung Cancer Screening with LDCT placed in Epic. Yes (CT Chest Lung Cancer Screening Low Dose W/O CM) [id number] Z12.2-Screening of respiratory organs Z87.891-Personal history of nicotine dependence   Alyse Bach, RN

## 2023-09-08 NOTE — Patient Instructions (Signed)

## 2023-09-11 ENCOUNTER — Ambulatory Visit: Payer: Medicare HMO

## 2023-09-11 DIAGNOSIS — Z122 Encounter for screening for malignant neoplasm of respiratory organs: Secondary | ICD-10-CM | POA: Diagnosis not present

## 2023-09-11 DIAGNOSIS — F17210 Nicotine dependence, cigarettes, uncomplicated: Secondary | ICD-10-CM

## 2023-09-11 DIAGNOSIS — Z87891 Personal history of nicotine dependence: Secondary | ICD-10-CM

## 2023-09-13 ENCOUNTER — Other Ambulatory Visit: Payer: Self-pay | Admitting: Physician Assistant

## 2023-09-13 DIAGNOSIS — M545 Low back pain, unspecified: Secondary | ICD-10-CM

## 2023-09-13 DIAGNOSIS — G894 Chronic pain syndrome: Secondary | ICD-10-CM

## 2023-09-15 ENCOUNTER — Other Ambulatory Visit: Payer: Self-pay | Admitting: Physician Assistant

## 2023-09-15 DIAGNOSIS — F411 Generalized anxiety disorder: Secondary | ICD-10-CM

## 2023-09-26 DIAGNOSIS — H43813 Vitreous degeneration, bilateral: Secondary | ICD-10-CM | POA: Diagnosis not present

## 2023-09-26 DIAGNOSIS — H353231 Exudative age-related macular degeneration, bilateral, with active choroidal neovascularization: Secondary | ICD-10-CM | POA: Diagnosis not present

## 2023-09-26 DIAGNOSIS — Z961 Presence of intraocular lens: Secondary | ICD-10-CM | POA: Diagnosis not present

## 2023-09-27 ENCOUNTER — Other Ambulatory Visit: Payer: Self-pay | Admitting: Acute Care

## 2023-09-27 ENCOUNTER — Telehealth: Payer: Self-pay

## 2023-09-27 DIAGNOSIS — Z87891 Personal history of nicotine dependence: Secondary | ICD-10-CM

## 2023-09-27 DIAGNOSIS — F411 Generalized anxiety disorder: Secondary | ICD-10-CM

## 2023-09-27 DIAGNOSIS — F17210 Nicotine dependence, cigarettes, uncomplicated: Secondary | ICD-10-CM

## 2023-09-27 DIAGNOSIS — Z122 Encounter for screening for malignant neoplasm of respiratory organs: Secondary | ICD-10-CM

## 2023-09-27 NOTE — Telephone Encounter (Signed)
 Copied from CRM [id number]. Topic: Clinical - Prescription Issue >> Sep 25, 2023  1:46 PM Brandy W wrote: Reason for CRM: hydrOXYzine  (ATARAX ) 25 MG tablet  Pt stated pharmacry is not giving enough tablets, they are only giving 30 enough for 1 tablet at bedtime. instead of 90 for 3x a day pt would like that to be updated   callback: [phone]

## 2023-09-30 ENCOUNTER — Other Ambulatory Visit: Payer: Self-pay | Admitting: Physician Assistant

## 2023-09-30 DIAGNOSIS — K219 Gastro-esophageal reflux disease without esophagitis: Secondary | ICD-10-CM

## 2023-09-30 DIAGNOSIS — D508 Other iron deficiency anemias: Secondary | ICD-10-CM

## 2023-10-02 ENCOUNTER — Other Ambulatory Visit: Payer: Self-pay | Admitting: Physician Assistant

## 2023-10-03 ENCOUNTER — Other Ambulatory Visit: Payer: Self-pay | Admitting: Physician Assistant

## 2023-10-03 DIAGNOSIS — F5101 Primary insomnia: Secondary | ICD-10-CM

## 2023-10-03 NOTE — Telephone Encounter (Signed)
 Copied from CRM [id number]. Topic: Clinical - Prescription Issue >> Oct 03, 2023  9:25 AM Antonio H wrote: Reason for CRM: Patient is needing a refill on her traZODone  (DESYREL ) 50 MG tablet to be sent to [id number] - [place], [place] - [address] refill request is already pending and patient is out of medication.

## 2023-10-04 NOTE — Telephone Encounter (Signed)
 Requesting rx rf of trazodone   Last written 07/07/2023 Last OV 08/29/2023 Upcoming appt = none

## 2023-10-05 ENCOUNTER — Telehealth: Payer: Self-pay

## 2023-10-05 NOTE — Telephone Encounter (Signed)
Rachael Douglas states she sent a refill request to the pharmacy for her medications. I advised her we approved all the ones the pharmacy sent.

## 2023-10-05 NOTE — Telephone Encounter (Signed)
Patient states she feels she does need this during the day and it is  not making  her sleepy .  Requesting to be filled as TID.

## 2023-10-05 NOTE — Telephone Encounter (Signed)
Copied from CRM [id number]. Topic: Clinical - Prescription Issue >> Oct 05, 2023  9:50 AM Nila Nephew wrote: Reason for CRM: Patient calling in to state that none of her prescriptions have been filled. She wants [place] to call her pharmacy and have them report what medicines she needs a refill for, as she cannot remember.

## 2023-10-06 MED ORDER — HYDROXYZINE HCL 25 MG PO TABS
ORAL_TABLET | ORAL | 1 refills | Status: DC
Start: 2023-10-06 — End: 2024-02-22

## 2023-10-06 NOTE — Telephone Encounter (Signed)
Patient informed. 

## 2023-10-08 ENCOUNTER — Other Ambulatory Visit: Payer: Self-pay | Admitting: Physician Assistant

## 2023-10-08 DIAGNOSIS — G894 Chronic pain syndrome: Secondary | ICD-10-CM

## 2023-10-10 ENCOUNTER — Other Ambulatory Visit: Payer: Self-pay | Admitting: Physician Assistant

## 2023-10-10 DIAGNOSIS — G894 Chronic pain syndrome: Secondary | ICD-10-CM

## 2023-10-10 NOTE — Telephone Encounter (Signed)
Copied from CRM [id number]. Topic: Clinical - Medication Refill >> Oct 10, 2023  2:54 PM Nila Nephew wrote: Most Recent Primary Care Visit:  Provider: Jomarie Longs  Department: [facility]  Visit Type: PHYSICAL  Date: 08/29/2023  Medication: Tramadol  Has the patient contacted their pharmacy? Yes - Refill request sent in 01/19 but not addresses yet  Is this the correct pharmacy for this prescription? Yes If no, delete pharmacy and type the correct one.  This is the patient's preferred pharmacy:  [id number] - [place], [place] - [address] AT [facility] [address]  [place] [address] Phone: [phone] Fax: [phone]    Has the prescription been filled recently? No  Is the patient out of the medication? Yes  Has the patient been seen for an appointment in the last year OR does the patient have an upcoming appointment? Yes  Can we respond through MyChart? No  Agent: Please be advised that Rx refills may take up to 3 business days. We ask that you follow-up with your pharmacy.

## 2023-10-11 ENCOUNTER — Telehealth (INDEPENDENT_AMBULATORY_CARE_PROVIDER_SITE_OTHER): Payer: 59 | Admitting: Physician Assistant

## 2023-10-11 VITALS — Ht 64.0 in | Wt 122.0 lb

## 2023-10-11 DIAGNOSIS — F331 Major depressive disorder, recurrent, moderate: Secondary | ICD-10-CM | POA: Diagnosis not present

## 2023-10-11 DIAGNOSIS — G8929 Other chronic pain: Secondary | ICD-10-CM

## 2023-10-11 DIAGNOSIS — G894 Chronic pain syndrome: Secondary | ICD-10-CM | POA: Diagnosis not present

## 2023-10-11 DIAGNOSIS — F411 Generalized anxiety disorder: Secondary | ICD-10-CM

## 2023-10-11 DIAGNOSIS — M1711 Unilateral primary osteoarthritis, right knee: Secondary | ICD-10-CM | POA: Diagnosis not present

## 2023-10-11 DIAGNOSIS — D508 Other iron deficiency anemias: Secondary | ICD-10-CM

## 2023-10-11 DIAGNOSIS — M545 Low back pain, unspecified: Secondary | ICD-10-CM

## 2023-10-11 DIAGNOSIS — E039 Hypothyroidism, unspecified: Secondary | ICD-10-CM | POA: Diagnosis not present

## 2023-10-11 DIAGNOSIS — L409 Psoriasis, unspecified: Secondary | ICD-10-CM | POA: Diagnosis not present

## 2023-10-11 MED ORDER — TRAMADOL HCL 50 MG PO TABS: ORAL_TABLET | ORAL | 0 refills | Status: DC

## 2023-10-11 MED ORDER — FERROUS SULFATE 325 (65 FE) MG PO TBEC
DELAYED_RELEASE_TABLET | ORAL | 3 refills | Status: DC
Start: 2023-10-11 — End: 2023-10-23

## 2023-10-11 MED ORDER — CELECOXIB 200 MG PO CAPS
ORAL_CAPSULE | ORAL | 1 refills | Status: DC
Start: 2023-10-11 — End: 2024-04-08

## 2023-10-11 MED ORDER — CYCLOBENZAPRINE HCL 10 MG PO TABS
ORAL_TABLET | ORAL | 1 refills | Status: DC
Start: 2023-10-11 — End: 2023-11-10

## 2023-10-11 MED ORDER — ARIPIPRAZOLE 10 MG PO TABS
ORAL_TABLET | ORAL | 1 refills | Status: DC
Start: 2023-10-11 — End: 2023-12-29

## 2023-10-11 MED ORDER — DULOXETINE HCL 60 MG PO CPEP: 120.0000 mg | ORAL_CAPSULE | Freq: Every day | ORAL | 1 refills | Status: DC

## 2023-10-11 MED ORDER — FEXOFENADINE HCL 180 MG PO TABS: 180.0000 mg | ORAL_TABLET | Freq: Every day | ORAL | 3 refills | Status: DC

## 2023-10-11 MED ORDER — LEVOTHYROXINE SODIUM 75 MCG PO TABS
ORAL_TABLET | ORAL | 1 refills | Status: DC
Start: 2023-10-11 — End: 2023-11-29

## 2023-10-11 MED ORDER — BUSPIRONE HCL 15 MG PO TABS: ORAL_TABLET | ORAL | 1 refills | Status: DC

## 2023-10-11 NOTE — Progress Notes (Signed)
..Virtual Visit via Telephone Note  I connected with Rachael Douglas on 10/13/23 at  7:50 AM EST by telephone and verified that I am speaking with the correct person using two identifiers. Attempted video visit but patient could not get video to work.   Location: Patient: home Provider: clinic  .Marland KitchenParticipating in visit:  Patient: Rachael Douglas Provider: Tandy Gaw PA-C Provider in training: Gareth Eagle PA-S   I discussed the limitations, risks, security and privacy concerns of performing an evaluation and management service by telephone and the availability of in person appointments. I also discussed with the patient that there may be a patient responsible charge related to this service. The patient expressed understanding and agreed to proceed.   History of Present Illness: Pt is a 68 yo female who calls into the clinic for medication refills. She could not get mychart to work.   She is doing ok. She would like to get back on medications for psoriasis. She was let go due to missing appt and not on anything right now.   She is taking tramadol, celebrex, cymbalta and gabapentin for pain and doing ok. It does help some.   She is taking abilify, cymbalta, buspar, hydroxyzine for anxiety and depression and controlled.       .. Active Ambulatory Problems    Diagnosis Date Noted   Hypothyroidism 05/16/2014   Anxiety disorder 05/16/2014   Aortic heart valve narrowing 04/12/2013   DDD (degenerative disc disease), lumbar 05/16/2014   Acid reflux 05/16/2014   HLD (hyperlipidemia) 05/16/2014   Arteriosclerosis of coronary artery 08/30/2013   OP (osteoporosis) 05/16/2014   Major depressive disorder, recurrent episode (HCC) 05/16/2014   COPD, moderate (HCC) 09/25/2014   Stricture and stenosis of esophagus 10/23/2014   Aortic valve replaced 10/23/2014   Constipation 12/17/2014   Multiple gastric ulcers 09/05/2016   Collagenous colitis 09/09/2016   Primary insomnia 05/16/2017    Chronic prescription benzodiazepine use 07/04/2017   Left carotid bruit 08/15/2017   Actinic keratoses 12/03/2017   Pruritus 02/21/2018   Eczema 05/30/2018   Palpable mass of lower back 05/31/2018   Chronic bilateral low back pain without sciatica 05/31/2018   Easy bruising 09/12/2018   Family history of Alzheimer's disease 07/29/2019   Closed fracture of distal end of both radius and ulna with routine healing, subsequent encounter 03/09/2021   Memory changes 01/04/2022   At high risk for falls 01/04/2022   Psoriasis 01/21/2022   Iron deficiency anemia secondary to inadequate dietary iron intake 01/21/2022   Axillary mass, right 01/21/2022   Chronic pain syndrome 03/17/2022   Recurrent UTI 05/11/2022   Primary osteoarthritis of right knee 05/11/2022   Acute stress reaction 09/28/2022   Urinary retention 09/28/2022   Hallucinations 11/16/2022   Delirium 11/16/2022   GAD (generalized anxiety disorder) 11/23/2022   Cellulitis of right hand 02/16/2023   Resolved Ambulatory Problems    Diagnosis Date Noted   Arthropathia 07/10/2008   Leg pain 12/04/2012   Anxiety and depression 05/16/2014   Gastric catarrh 08/30/2013   Complication of surgery 11/13/2015   Left foot pain 02/05/2016   Failure to attend appointment 05/05/2016   Diarrhea 08/01/2016   Right shoulder pain 11/23/2016   Left shoulder pain 11/23/2016   Trochanteric bursitis of both hips 12/07/2016   Rash and nonspecific skin eruption 07/04/2017   Ankle ligament laxity, right 05/28/2018   Anxiety 05/31/2018   Headache 02/26/2020   Past Medical History:  Diagnosis Date   Arthritis    Heart disease  Hyperlipidemia    Hypertension    PUD (peptic ulcer disease)    Thyroid disease     Observations/Objective: No acute distress Normal breathing Normal mood  .[name] Today's Vitals   10/11/23 0752  Weight: 122 lb (55.3 kg)  Height: 5\' 4"  (1.626 m)   Body mass index is 20.94 kg/m.    Assessment and  Plan: Marland KitchenMarland KitchenDiagnoses and all orders for this visit:  Chronic pain syndrome -     traMADol (ULTRAM) 50 MG tablet; TAKE 2 TABLETS BY MOUTH EVERY 8 TO 12 HOURS FOR CHRONIC PAIN -     cyclobenzaprine (FLEXERIL) 10 MG tablet; TAKE 1 TABLET BY MOUTH DAILY AS NEEDED FOR BACK PAIN -     traMADol (ULTRAM) 50 MG tablet; TAKE 2 TABLETS BY MOUTH EVERY 8 TO 12 HOURS FOR CHRONIC PAIN -     traMADol (ULTRAM) 50 MG tablet; TAKE 2 TABLETS BY MOUTH EVERY 8 TO 12 HOURS FOR CHRONIC PAIN  Primary osteoarthritis of right knee -     celecoxib (CELEBREX) 200 MG capsule; One to 2 tablets by mouth daily as needed for pain.  Moderate episode of recurrent major depressive disorder (HCC) -     DULoxetine (CYMBALTA) 60 MG capsule; Take 2 capsules (120 mg total) by mouth daily. -     ARIPiprazole (ABILIFY) 10 MG tablet; TAKE 1 TABLET(10 MG) BY MOUTH DAILY  Iron deficiency anemia secondary to inadequate dietary iron intake -     ferrous sulfate 325 (65 FE) MG EC tablet; TAKE 1 TABLET(325 MG) BY MOUTH DAILY WITH BREAKFAST  GAD (generalized anxiety disorder) -     busPIRone (BUSPAR) 15 MG tablet; TAKE ONE TABLET (15MG ) BY MOUTH THREE TIMES DAILY  Chronic bilateral low back pain without sciatica -     cyclobenzaprine (FLEXERIL) 10 MG tablet; TAKE 1 TABLET BY MOUTH DAILY AS NEEDED FOR BACK PAIN  Acquired hypothyroidism -     levothyroxine (SYNTHROID) 75 MCG tablet; TAKE 1 TABLET(75 MCG) BY MOUTH DAILY BEFORE BREAKFAST  Psoriasis -     fexofenadine (ALLEGRA) 180 MG tablet; Take 1 tablet (180 mg total) by mouth daily. -     Ambulatory referral to Dermatology   Psoriasis referral to derm made to consier skyrizi or other biologics, she was released from [place] for missing appt.   Refills given today .Marland KitchenPDMP reviewed during this encounter. Pain contract UTD Follow up in 3 months     Follow Up Instructions:    I discussed the assessment and treatment plan with the patient. The patient was provided an  opportunity to ask questions and all were answered. The patient agreed with the plan and demonstrated an understanding of the instructions.   The patient was advised to call back or seek an in-person evaluation if the symptoms worsen or if the condition fails to improve as anticipated.  I provided 10 minutes of non-face-to-face time during this encounter.   Tandy Gaw, PA-C

## 2023-10-13 ENCOUNTER — Other Ambulatory Visit: Payer: Self-pay | Admitting: Physician Assistant

## 2023-10-13 DIAGNOSIS — F411 Generalized anxiety disorder: Secondary | ICD-10-CM

## 2023-10-17 NOTE — Telephone Encounter (Unsigned)
Copied from CRM [id number]. Topic: Referral - Question >> Oct 17, 2023 11:41 AM Gildardo Pounds wrote: Reason for CRM: Lesly Rubenstein, please advise Dermatologist that the patient is on Medicaid. The last referral did not accept Medicaid.Callback number  is [phone]

## 2023-10-20 ENCOUNTER — Other Ambulatory Visit: Payer: Self-pay | Admitting: Physician Assistant

## 2023-10-20 ENCOUNTER — Telehealth: Payer: Self-pay | Admitting: Physician Assistant

## 2023-10-20 DIAGNOSIS — D508 Other iron deficiency anemias: Secondary | ICD-10-CM

## 2023-10-20 NOTE — Telephone Encounter (Signed)
Referral has been sent to: [facility] [address] [place], [place] [address] Appointments [phone] Valinda Hoar)   Copied from CRM [id number]. Topic: Referral - Status >> Oct 17, 2023 11:39 AM Nila Nephew wrote: Reason for CRM: [facility] was referral to [facility] [place]. Informed by location that they do not accept the patient's insurance and they do not take Medicaid. Advised to send referral elsewhere.

## 2023-10-23 ENCOUNTER — Other Ambulatory Visit: Payer: Self-pay | Admitting: Physician Assistant

## 2023-10-23 DIAGNOSIS — D508 Other iron deficiency anemias: Secondary | ICD-10-CM

## 2023-10-24 ENCOUNTER — Other Ambulatory Visit: Payer: Self-pay | Admitting: Physician Assistant

## 2023-10-24 DIAGNOSIS — K219 Gastro-esophageal reflux disease without esophagitis: Secondary | ICD-10-CM

## 2023-10-24 NOTE — Telephone Encounter (Signed)
 Copied from CRM [id number]. Topic: Clinical - Medication Refill >> Oct 24, 2023  2:41 PM Rachael Douglas wrote: Most Recent Primary Care Visit:  Provider: ANTONIETTE VERMELL CROME  Department: [facility] CARE [facility]  Visit Type: MYCHART VIDEO VISIT  Date: 10/11/2023  Medication:  pantoprazole  (PROTONIX ) 40 MG tablet  Has the patient contacted their pharmacy? No  Is this the correct pharmacy for this prescription? Yes If no, delete pharmacy and type the correct one.  This is the patient's preferred pharmacy:  [facility] #[id number] - [place], [place] - [address] AT [facility] [address] [place] [address] Phone: [phone] Fax: [phone]   Has the prescription been filled recently? No  Is the patient out of the medication? Yes  Has the patient been seen for an appointment in the last year OR does the patient have an upcoming appointment? Yes  Can we respond through MyChart? No - Phone Call  Agent: Please be advised that Rx refills may take up to 3 business days. We ask that you follow-up with your pharmacy.

## 2023-10-24 NOTE — Telephone Encounter (Signed)
Last Fill: 10/02/23  Last OV: 10/11/23 Next OV: None Scheduled  Routing to provider for review/authorization.

## 2023-10-25 ENCOUNTER — Other Ambulatory Visit: Payer: Self-pay | Admitting: Physician Assistant

## 2023-10-25 DIAGNOSIS — G894 Chronic pain syndrome: Secondary | ICD-10-CM

## 2023-10-25 MED ORDER — PANTOPRAZOLE SODIUM 40 MG PO TBEC: 40.0000 mg | DELAYED_RELEASE_TABLET | Freq: Every day | ORAL | 3 refills | Status: DC

## 2023-10-30 ENCOUNTER — Telehealth: Payer: Self-pay

## 2023-10-30 NOTE — Telephone Encounter (Signed)
 Copied from CRM [id number]. Topic: Clinical - Prescription Issue >> Oct 30, 2023  3:14 PM Brynn Caras wrote: Reason for CRM: PT is completely out of the traMADol  (ULTRAM ) 50 MG tablet, she is needing a refill before the end of today if possible. She states she contacted her pharmacy and was advised their request was rejected.

## 2023-10-30 NOTE — Telephone Encounter (Signed)
 Patient informed that refill available at pharmacy but too early to fill.  She states her husband ran out of medication and she let him burrow some.  She will use some of his to replace the ones he had used.

## 2023-10-31 ENCOUNTER — Other Ambulatory Visit: Payer: Self-pay | Admitting: Physician Assistant

## 2023-10-31 DIAGNOSIS — F331 Major depressive disorder, recurrent, moderate: Secondary | ICD-10-CM

## 2023-10-31 DIAGNOSIS — I2510 Atherosclerotic heart disease of native coronary artery without angina pectoris: Secondary | ICD-10-CM

## 2023-10-31 DIAGNOSIS — K219 Gastro-esophageal reflux disease without esophagitis: Secondary | ICD-10-CM

## 2023-11-01 ENCOUNTER — Other Ambulatory Visit: Payer: Self-pay | Admitting: Physician Assistant

## 2023-11-01 DIAGNOSIS — K219 Gastro-esophageal reflux disease without esophagitis: Secondary | ICD-10-CM

## 2023-11-01 DIAGNOSIS — R609 Edema, unspecified: Secondary | ICD-10-CM

## 2023-11-01 MED ORDER — FUROSEMIDE 40 MG PO TABS: ORAL_TABLET | ORAL | 0 refills | Status: DC

## 2023-11-01 NOTE — Telephone Encounter (Signed)
Last Fill: Lasix: 03/06/23     Protonix: 10/25/23  Last OV: 10/11/23 Next OV: None Scheduled  Routing to provider for review/authorization.

## 2023-11-01 NOTE — Telephone Encounter (Signed)
Copied from CRM [id number]. Topic: Clinical - Medication Refill >> Nov 01, 2023 10:17 AM Thomes Dinning wrote: Most Recent Primary Care Visit:  Provider: Jomarie Longs  Department: [facility] CARE [facility]  Visit Type: MYCHART VIDEO VISIT  Date: 10/11/2023  Medication:   pantoprazole (PROTONIX) 40 MG tablet  furosemide (LASIX) 40 MG tablet  Has the patient contacted their pharmacy? No (Agent: If no, request that the patient contact the pharmacy for the refill. If patient does not wish to contact the pharmacy document the reason why and proceed with request.) (Agent: If yes, when and what did the pharmacy advise?) Advised the scripts were not sent to the pharmacy  Is this the correct pharmacy for this prescription? Yes If no, delete pharmacy and type the correct one.  This is the patient's preferred pharmacy:  [facility] #[id number] - [place], [place] - [address] AT [facility] [address] [place]  [address] Phone: [phone] Fax: [phone]  [facility] #[id number] - [place], [place] - [address] AT [facility] [address] [place] [address] Phone: [phone] Fax: [phone]  [facility] - [place], [place] - [address] [place] [address] Phone: [phone] Fax: [phone]   Has the prescription been filled recently? No  Is the patient out of the medication? Yes  Has the patient been seen for an appointment in the last year OR does the patient have an upcoming appointment? Yes  Can we respond through MyChart? Yes  Agent: Please be advised that Rx refills may take up to 3 business days. We ask that you follow-up with your pharmacy.

## 2023-11-10 ENCOUNTER — Telehealth: Payer: Self-pay

## 2023-11-10 DIAGNOSIS — G894 Chronic pain syndrome: Secondary | ICD-10-CM

## 2023-11-10 DIAGNOSIS — M545 Low back pain, unspecified: Secondary | ICD-10-CM

## 2023-11-10 MED ORDER — CYCLOBENZAPRINE HCL 10 MG PO TABS
ORAL_TABLET | ORAL | 5 refills | Status: DC
Start: 2023-11-10 — End: 2024-03-20

## 2023-11-10 NOTE — Telephone Encounter (Signed)
 Copied from CRM [id number]. Topic: Clinical - Medication Question >> Nov 10, 2023  4:06 PM Rachael Douglas wrote: Reason for CRM: patient is calling to request that dosage on cyclobenzaprine be refilled with a different dosage. States it should be 1 tablet three times a day, where each tablet is 10mg .   States the one she has tells her to take one tablet daily and pharmacy is denying the fill until April.

## 2023-11-12 ENCOUNTER — Other Ambulatory Visit: Payer: Self-pay | Admitting: Physician Assistant

## 2023-11-12 DIAGNOSIS — G894 Chronic pain syndrome: Secondary | ICD-10-CM

## 2023-11-14 ENCOUNTER — Telehealth: Payer: Self-pay | Admitting: Physician Assistant

## 2023-11-14 NOTE — Telephone Encounter (Signed)
 Copied from CRM [id number]. Topic: Clinical - Medication Refill >> Nov 14, 2023 11:00 AM Desma Mcgregor wrote: Most Recent Primary Care Visit:  Provider: Jomarie Longs  Department: [facility] CARE [facility]  Visit Type: MYCHART VIDEO VISIT  Date: 10/11/2023  Medication: PANCRELIPASE (CREON DR) 36,000 Original BTL Units Cap  Has the patient contacted their pharmacy? Yes. Pharmacy does not have a rx on file. Was being filled at [facility]  Is this the correct pharmacy for this prescription? Yes If no, delete pharmacy and type the correct one.  This is the patient's preferred pharmacy:  [facility] #[id number] - [place], [place] - [address] AT [facility] [address] [place] [address] Phone: [phone] Fax: [phone]  Has the prescription been filled recently? Yes, 08/2023  Is the patient out of the medication? No  Has the patient been seen for an appointment in the last year OR does the patient have an upcoming appointment? Yes  Can we respond through MyChart? No  Agent: Please be advised that Rx refills may take up to 3 business days. We ask that you follow-up with your pharmacy.

## 2023-11-15 MED ORDER — CREON 36000-114000 UNITS PO CPEP
36000.0000 [IU] | ORAL_CAPSULE | Freq: Three times a day (TID) | ORAL | 1 refills | Status: DC
Start: 2023-11-15 — End: 2024-04-23

## 2023-11-16 NOTE — Telephone Encounter (Signed)
 Patient informed.

## 2023-11-29 ENCOUNTER — Other Ambulatory Visit: Payer: Self-pay | Admitting: Physician Assistant

## 2023-11-29 DIAGNOSIS — K219 Gastro-esophageal reflux disease without esophagitis: Secondary | ICD-10-CM

## 2023-11-29 DIAGNOSIS — R609 Edema, unspecified: Secondary | ICD-10-CM

## 2023-11-29 DIAGNOSIS — E039 Hypothyroidism, unspecified: Secondary | ICD-10-CM

## 2023-12-06 DIAGNOSIS — L400 Psoriasis vulgaris: Secondary | ICD-10-CM | POA: Diagnosis not present

## 2023-12-06 DIAGNOSIS — Z79899 Other long term (current) drug therapy: Secondary | ICD-10-CM | POA: Diagnosis not present

## 2023-12-12 ENCOUNTER — Encounter: Payer: Self-pay | Admitting: Cardiology

## 2023-12-12 ENCOUNTER — Ambulatory Visit: Attending: Cardiology | Admitting: Cardiology

## 2023-12-12 VITALS — BP 120/68 | HR 58 | Ht 64.0 in | Wt 129.8 lb

## 2023-12-12 DIAGNOSIS — I2510 Atherosclerotic heart disease of native coronary artery without angina pectoris: Secondary | ICD-10-CM | POA: Diagnosis not present

## 2023-12-12 DIAGNOSIS — I350 Nonrheumatic aortic (valve) stenosis: Secondary | ICD-10-CM

## 2023-12-12 MED ORDER — ROSUVASTATIN CALCIUM 40 MG PO TABS: 40.0000 mg | ORAL_TABLET | Freq: Every day | ORAL | 3 refills | Status: AC

## 2023-12-12 NOTE — Patient Instructions (Signed)
 Medication Instructions:   STOP PRAVASTATIN  START ROSUVASTATIN 40 MG ONCE DAILY  *If you need a refill on your cardiac medications before your next appointment, please call your pharmacy*   Lab Work:  Your physician recommends that you return for lab work in: [facility]  If you have labs (blood work) drawn today and your tests are completely normal, you will receive your results only by: MyChart Message (if you have MyChart) OR A paper copy in the mail If you have any lab test that is abnormal or we need to change your treatment, we will call you to review the results.  Follow-Up: At [facility], you and your health needs are our priority.  As part of our continuing mission to provide you with exceptional heart care, we have created designated Provider Care Teams.  These Care Teams include your primary Cardiologist (physician) and Advanced Practice Providers (APPs -  Physician Assistants and Nurse Practitioners) who all work together to provide you with the care you need, when you need it.  We recommend signing up for the patient portal called "MyChart".  Sign up information is provided on this After Visit Summary.  MyChart is used to connect with patients for Virtual Visits (Telemedicine).  Patients are able to view lab/test results, encounter notes, upcoming appointments, etc.  Non-urgent messages can be sent to your provider as well.   To learn more about what you can do with MyChart, go to [email or web address].    Your next appointment:   6 month(s)  Provider:   [name] MD

## 2023-12-12 NOTE — Progress Notes (Signed)
 HPI: FU aortic valve replacement. Previously followed at [facility]. Had bioprosthetic aortic valve replacement in 2014. Apparently had no obstructive coronary disease preoperatively. She has had a previous monitor for palpitations that was normal by report. Carotid Dopplers December 2018 showed no significant stenosis. Left subclavian flow was disturbed.  Echocardiogram July 2023 showed normal LV function, grade 1 diastolic dysfunction, status post aortic valve replacement with mean gradient 19.7 mmHg, no aortic insufficiency.  Chest CT December 2024 showed three-vessel coronary atherosclerosis and emphysema.  Since last seen 04/28/21, she has had occasional chest pain.  It is in the substernal area without radiation.  Lasts less than 1 minute.  It is not exertional.  She denies dyspnea on exertion, exertional chest pain, orthopnea, PND or syncope.  Current Outpatient Medications  Medication Sig Dispense Refill   albuterol (VENTOLIN HFA) 108 (90 Base) MCG/ACT inhaler Inhale 1-2 puffs into the lungs every 6 (six) hours as needed for wheezing or shortness of breath. INHALE 2 PUFFS EVERY 6 HOURS AS NEEDED 6.7 g 2   AMBULATORY NON FORMULARY MEDICATION In and out catheter and supplies to self catherize every night. 100 Device 0   Apremilast (OTEZLA) 30 MG TABS Take by mouth.     ARIPiprazole (ABILIFY) 10 MG tablet TAKE 1 TABLET(10 MG) BY MOUTH DAILY 90 tablet 1   budesonide (ENTOCORT EC) 3 MG 24 hr capsule TAKE 3 CAPSULES(9 MG) BY MOUTH EVERY MORNING     busPIRone (BUSPAR) 15 MG tablet TAKE ONE TABLET (15MG ) BY MOUTH THREE TIMES DAILY 270 tablet 1   celecoxib (CELEBREX) 200 MG capsule One to 2 tablets by mouth daily as needed for pain. 180 capsule 1   clobetasol ointment (TEMOVATE) 0.05 % Apply topically.     colestipol (COLESTID) 1 g tablet TAKE 1 TABLET(1 GRAM) BY MOUTH DAILY     CREON 36000-114000 units CPEP capsule Take 1 capsule (36,000 Units total) by mouth 3 (three) times daily with meals. 270  capsule 1   cyclobenzaprine (FLEXERIL) 10 MG tablet TAKE 1 TABLET BY MOUTH DAILY AS NEEDED up to three times a day FOR BACK PAIN 90 tablet 5   diclofenac Sodium (VOLTAREN) 1 % GEL Apply 4 g topically 4 (four) times daily. To affected joint. 350 g 1   donepezil (ARICEPT) 10 MG tablet TAKE 1 TABLET(10 MG) BY MOUTH AT BEDTIME 90 tablet 1   DULoxetine (CYMBALTA) 60 MG capsule TAKE TWO CAPSULES BY MOUTH DAILY 180 capsule 11   famotidine (PEPCID) 40 MG tablet TAKE 1 TABLET(40 MG) BY MOUTH AT BEDTIME 90 tablet 3   ferrous sulfate 325 (65 FE) MG EC tablet TAKE ONE TABLET (325 MG) BY MOUTH DAILY WITH BREAKFAST 170 tablet 0   fexofenadine (ALLEGRA) 180 MG tablet Take 1 tablet (180 mg total) by mouth daily. 90 tablet 3   fluticasone (FLONASE) 50 MCG/ACT nasal spray Place 2 sprays into both nostrils daily. 16 g 0   furosemide (LASIX) 40 MG tablet TAKE ONE TABLET BY MOUTH EVERY DAY 90 tablet 11   gabapentin (NEURONTIN) 600 MG tablet Take 2 tablets (1,200 mg total) by mouth 3 (three) times daily. 540 tablet 1   hydrOXYzine (ATARAX) 25 MG tablet Take one tablet three times a day for anxiety. 270 tablet 1   levothyroxine (SYNTHROID) 75 MCG tablet TAKE ONE TABLET BY MOUTH DAILY AT 8AM EVERY MORNING 90 tablet 11   LINZESS 290 MCG CAPS capsule TAKE ONE CAPSULE BY MOUTH EVERY DAY 90 capsule 11   metoprolol  tartrate (LOPRESSOR) 25 MG tablet TAKE ONE TABLET (25MG ) BY MOUTH TWICE DAILY 180 tablet 11   mirtazapine (REMERON) 7.5 MG tablet Take by mouth.     ondansetron (ZOFRAN ODT) 4 MG disintegrating tablet Take 1 tablet (4 mg total) by mouth every 8 (eight) hours as needed for nausea or vomiting. 12 tablet 0   pantoprazole (PROTONIX) 40 MG tablet TAKE ONE TABLET BY MOUTH EVERY DAY 90 tablet 11   potassium chloride (KLOR-CON) 10 MEQ tablet TAKE ONE TABLET (10 MEQ) BY MOUTH DAILY 90 tablet 11   pravastatin (PRAVACHOL) 80 MG tablet TAKE 1 TABLET(80 MG) BY MOUTH DAILY 90 tablet 1   PREVNAR 20 0.5 ML injection       SHINGRIX injection      traMADol (ULTRAM) 50 MG tablet TAKE 2 TABLETS BY MOUTH EVERY 8 TO 12 HOURS FOR CHRONIC PAIN 120 tablet 0   traMADol (ULTRAM) 50 MG tablet TAKE 2 TABLETS BY MOUTH EVERY 8 TO 12 HOURS FOR CHRONIC PAIN 120 tablet 0   traZODone (DESYREL) 50 MG tablet TAKE 1 TO 2 TABLETS BY MOUTH EVERY NIGHT 30 MINUTES BEFORE BEDTIME 180 tablet 1   triamcinolone (KENALOG) 0.1 % paste APPLY A THIN LAYER ON MOUTH ULCERS 2 TO 3 TIMES DAILY AFTER MEALS FOR 5 TO 7 DAYS 10 g 1   Vitamin D, Ergocalciferol, (DRISDOL) 1.25 MG (50000 UNIT) CAPS capsule Take by mouth.     traMADol (ULTRAM) 50 MG tablet TAKE 2 TABLETS BY MOUTH EVERY 8 TO 12 HOURS FOR CHRONIC PAIN (Patient not taking: Reported on 12/12/2023) 120 tablet 0   No current facility-administered medications for this visit.     Past Medical History:  Diagnosis Date   Arthritis    Heart disease    Hyperlipidemia    Hypertension    PUD (peptic ulcer disease)    Thyroid disease     Past Surgical History:  Procedure Laterality Date   AORTIC VALVE REPLACEMENT     TOTAL ABDOMINAL HYSTERECTOMY      Social History   Socioeconomic History   Marital status: Married    Spouse name: [name]   Number of children: 2   Years of education: 12   Highest education level: 12th grade  Occupational History    Comment: Retired  Tobacco Use   Smoking status: Every Day    Current packs/day: 1.00    Average packs/day: 1 pack/day for 52.2 years (52.2 ttl pk-yrs)    Types: Cigarettes, E-cigarettes    Start date: 1973    Last attempt to quit: 05/30/2014   Smokeless tobacco: Never  Vaping Use   Vaping status: Some Days   Substances: Nicotine  Substance and Sexual Activity   Alcohol use: No    Alcohol/week: 0.0 standard drinks of alcohol   Drug use: No   Sexual activity: Not Currently    Partners: Male    Comment: married  Other Topics Concern   Not on file  Social History Narrative   Lives with her husband. She enjoys reading and watching  television.   Social Drivers of Corporate investment banker Strain: Low Risk  (03/07/2022)   Overall Financial Resource Strain (CARDIA)    Difficulty of Paying Living Expenses: Not hard at all  Food Insecurity: No Food Insecurity (03/07/2022)   Hunger Vital Sign    Worried About Running Out of Food in the Last Year: Never true    Ran Out of Food in the Last Year: Never true  Transportation  Needs: No Transportation Needs (03/07/2022)   PRAPARE - Administrator, Civil Service (Medical): No    Lack of Transportation (Non-Medical): No  Physical Activity: Inactive (03/07/2022)   Exercise Vital Sign    Days of Exercise per Week: 0 days    Minutes of Exercise per Session: 0 min  Stress: No Stress Concern Present (03/07/2022)   [employer] of Occupational Health - Occupational Stress Questionnaire    Feeling of Stress : Not at all  Social Connections: Moderately Isolated (03/07/2022)   Social Connection and Isolation Panel [NHANES]    Frequency of Communication with Friends and Family: More than three times a week    Frequency of Social Gatherings with Friends and Family: More than three times a week    Attends Religious Services: Never    Database administrator or Organizations: No    Attends Banker Meetings: Never    Marital Status: Married  Catering manager Violence: Not At Risk (03/07/2022)   Humiliation, Afraid, Rape, and Kick questionnaire    Fear of Current or Ex-Partner: No    Emotionally Abused: No    Physically Abused: No    Sexually Abused: No    Family History  Problem Relation Age of Onset   Cancer Other    Depression Other    Diabetes Other    Hypertension Other    Breast cancer Other     ROS: no fevers or chills, productive cough, hemoptysis, dysphasia, odynophagia, melena, hematochezia, dysuria, hematuria, rash, seizure activity, orthopnea, PND, pedal edema, claudication. Remaining systems are negative.  Physical Exam: Well-developed  well-nourished in no acute distress.  Skin is warm and dry.  HEENT is normal.  Neck is supple.  Chest is clear to auscultation with normal expansion.  Cardiovascular exam is regular rate and rhythm.  2/6 systolic murmur left sternal border.  No diastolic murmur noted. Abdominal exam nontender or distended. No masses palpated. Extremities show no edema. neuro grossly intact  EKG Interpretation Date/Time:  Tuesday December 12 2023 09:15:33 EDT Ventricular Rate:  58 PR Interval:  146 QRS Duration:  78 QT Interval:  424 QTC Calculation: 416 R Axis:   42  Text Interpretation: Sinus bradycardia Septal infarct , age undetermined No previous ECGs available Confirmed by [name] ([id number]) on 12/12/2023 9:17:51 AM    A/P  1 status post aortic valve replacement-most recent echocardiogram showed normally functioning valve.  Continue SBE prophylaxis.  2 coronary calcification-continue statin.   Note this was on recent CT for screening purposes.  3 hypertension-blood pressure controlled.  Continue present medications.  4 hyperlipidemia-given coronary calcification will discontinue pravastatin and treat with Crestor 40 mg daily.  Check lipids and liver in 8 weeks.  5 chest pain-symptoms are atypical.  Not exertional and lasts less than 1 minute.  Electrocardiogram shows no ST changes.  Will not pursue further ischemia evaluation at this point.  [name], MD

## 2023-12-13 ENCOUNTER — Other Ambulatory Visit: Payer: Self-pay

## 2023-12-13 ENCOUNTER — Other Ambulatory Visit: Payer: Self-pay | Admitting: Physician Assistant

## 2023-12-13 DIAGNOSIS — G894 Chronic pain syndrome: Secondary | ICD-10-CM

## 2023-12-13 MED ORDER — TRAMADOL HCL 50 MG PO TABS
ORAL_TABLET | ORAL | 0 refills | Status: DC
Start: 2023-12-13 — End: 2024-01-12

## 2023-12-13 NOTE — Telephone Encounter (Signed)
 Copied from CRM [id number]. Topic: Clinical - Medication Refill >> Dec 13, 2023 10:22 AM Gery Pray wrote: Most Recent Primary Care Visit:  Provider: Jomarie Longs  Department: [facility]  Visit Type: MYCHART VIDEO VISIT  Date: 10/11/2023  Medication: traMADol (ULTRAM) 50 MG tablet   Has the patient contacted their pharmacy? Yes (Agent: If no, request that the patient contact the pharmacy for the refill. If patient does not wish to contact the pharmacy document the reason why and proceed with request.) (Agent: If yes, when and what did the pharmacy advise?) Needs new prescription  Is this the correct pharmacy for this prescription? Yes If no, delete pharmacy and type the correct one.  This is the patient's preferred pharmacy:  [id number] - [place], [place] - [address] AT [facility] OF [address] [place] [address] Phone: [phone] Fax: [phone]  Has the prescription been filled recently? No  Is the patient out of the medication? No 2 pills remaining  Has the patient been seen for an appointment in the last year OR does the patient have an upcoming appointment? Yes  Can we respond through MyChart? Yes  Agent: Please be advised that Rx refills may take up to 3 business days. We ask that you follow-up with your pharmacy.

## 2023-12-13 NOTE — Telephone Encounter (Signed)
 Spoke with pharmacy [facility] [address] States that they did not receive the prescription in chart for Tramadol showing sent on 12/10/2023 Could this be resent for the patient?

## 2023-12-28 ENCOUNTER — Other Ambulatory Visit: Payer: Self-pay | Admitting: Physician Assistant

## 2023-12-28 DIAGNOSIS — F331 Major depressive disorder, recurrent, moderate: Secondary | ICD-10-CM

## 2023-12-29 ENCOUNTER — Ambulatory Visit
Admission: EM | Admit: 2023-12-29 | Discharge: 2023-12-29 | Disposition: A | Discharge status: Home / Self Care | Attending: Family Medicine | Admitting: Family Medicine

## 2023-12-29 DIAGNOSIS — J0100 Acute maxillary sinusitis, unspecified: Secondary | ICD-10-CM

## 2023-12-29 DIAGNOSIS — J209 Acute bronchitis, unspecified: Secondary | ICD-10-CM | POA: Diagnosis not present

## 2023-12-29 MED ORDER — AMOXICILLIN-POT CLAVULANATE 875-125 MG PO TABS: ORAL_TABLET | ORAL | 0 refills | Status: DC

## 2023-12-29 MED ORDER — PREDNISONE 20 MG PO TABS: ORAL_TABLET | ORAL | 0 refills | Status: DC

## 2023-12-29 NOTE — Discharge Instructions (Signed)
 Take plain guaifenesin (1200mg  extended release tabs such as Mucinex) twice daily, with plenty of water, for cough and congestion.  Get adequate rest.   May use Afrin nasal spray (or generic oxymetazoline) each morning for about 5 days and then discontinue.  Also recommend using saline nasal spray several times daily and saline nasal irrigation (AYR is a common brand).  Use Flonase nasal spray each morning after using Afrin nasal spray and saline nasal irrigation. Try warm salt water gargles for sore throat.  Stop all antihistamines for now, and other non-prescription cough/cold preparations. May take Delsym Cough Suppressant ("12 Hour Cough Relief") at bedtime for nighttime cough.   If symptoms become significantly worse during the night or over the weekend, proceed to the local emergency room.

## 2023-12-29 NOTE — ED Triage Notes (Signed)
 Pt presents to [facility] with co cough, head congestion, runny nose since 2 weeks ago. Pt has taken otc benadryl and some old cough medication she had left over

## 2023-12-29 NOTE — ED Provider Notes (Signed)
 [facility]    CSN: [id number] Arrival date & time: 12/29/23  1531      History   Chief Complaint Chief Complaint  Patient presents with   URI    HPI Rachael Douglas is a 68 y.o. female.   Two weeks ago patient developed sinus congestion, sore throat, cough, and low grade fever 99. She now has pain in her facial areas but denies pleuritic pain and shortness of breath. She has a history of pneumonia about 2 to 3 years ago.  The history is provided by the patient.    Past Medical History:  Diagnosis Date   Arthritis    Heart disease    Hyperlipidemia    Hypertension    PUD (peptic ulcer disease)    Thyroid disease     Patient Active Problem List   Diagnosis Date Noted   Cellulitis of right hand 02/16/2023   GAD (generalized anxiety disorder) 11/23/2022   Hallucinations 11/16/2022   Delirium 11/16/2022   Acute stress reaction 09/28/2022   Urinary retention 09/28/2022   Recurrent UTI 05/11/2022   Primary osteoarthritis of right knee 05/11/2022   Chronic pain syndrome 03/17/2022   Psoriasis 01/21/2022   Iron deficiency anemia secondary to inadequate dietary iron intake 01/21/2022   Axillary mass, right 01/21/2022   Memory changes 01/04/2022   At high risk for falls 01/04/2022   Closed fracture of distal end of both radius and ulna with routine healing, subsequent encounter 03/09/2021   Family history of Alzheimer's disease 07/29/2019   Easy bruising 09/12/2018   Palpable mass of lower back 05/31/2018   Chronic bilateral low back pain without sciatica 05/31/2018   Eczema 05/30/2018   Pruritus 02/21/2018   Actinic keratoses 12/03/2017   Left carotid bruit 08/15/2017   Chronic prescription benzodiazepine use 07/04/2017   Primary insomnia 05/16/2017   Collagenous colitis 09/09/2016   Multiple gastric ulcers 09/05/2016   Constipation 12/17/2014   Stricture and stenosis of esophagus 10/23/2014   Aortic valve replaced 10/23/2014   COPD, moderate (HCC)  09/25/2014   Hypothyroidism 05/16/2014   Anxiety disorder 05/16/2014   DDD (degenerative disc disease), lumbar 05/16/2014   Acid reflux 05/16/2014   HLD (hyperlipidemia) 05/16/2014   OP (osteoporosis) 05/16/2014   Major depressive disorder, recurrent episode (HCC) 05/16/2014   Arteriosclerosis of coronary artery 08/30/2013   Aortic heart valve narrowing 04/12/2013    Past Surgical History:  Procedure Laterality Date   AORTIC VALVE REPLACEMENT     TOTAL ABDOMINAL HYSTERECTOMY      OB History   No obstetric history on file.      Home Medications    Prior to Admission medications   Medication Sig Start Date End Date Taking? Authorizing Provider  amoxicillin-clavulanate (AUGMENTIN) 875-125 MG tablet Take one tab PO Q12hr with food. 12/29/23  Yes Leon Rajas, MD  predniSONE (DELTASONE) 20 MG tablet Take one tab by mouth twice daily for 4 days, then one daily. Take with food. 12/29/23  Yes Leon Rajas, MD  albuterol (VENTOLIN HFA) 108 (90 Base) MCG/ACT inhaler Inhale 1-2 puffs into the lungs every 6 (six) hours as needed for wheezing or shortness of breath. INHALE 2 PUFFS EVERY 6 HOURS AS NEEDED 08/05/22   Breeback, Jade L, PA-C  AMBULATORY NON FORMULARY MEDICATION In and out catheter and supplies to self catherize every night. 10/05/21   Breeback, Jade L, PA-C  Apremilast (OTEZLA) 30 MG TABS Take by mouth. 12/17/21   [provider]  ARIPiprazole (ABILIFY)  10 MG tablet TAKE ONE TABLET BY MOUTH EVERY DAY 12/29/23   Breeback, Jade L, PA-C  budesonide (ENTOCORT EC) 3 MG 24 hr capsule TAKE 3 CAPSULES(9 MG) BY MOUTH EVERY MORNING 10/31/23   [provider]  busPIRone (BUSPAR) 15 MG tablet TAKE ONE TABLET (15MG ) BY MOUTH THREE TIMES DAILY 10/11/23   Breeback, Jade L, PA-C  celecoxib (CELEBREX) 200 MG capsule One to 2 tablets by mouth daily as needed for pain. 10/11/23   Breeback, Jade L, PA-C  clobetasol ointment (TEMOVATE) 0.05 % Apply topically. 03/17/22   [provider]  colestipol (COLESTID) 1 g tablet TAKE 1 TABLET(1 GRAM) BY MOUTH DAILY 10/31/23   [provider]  CREON 36000-114000 units CPEP capsule Take 1 capsule (36,000 Units total) by mouth 3 (three) times daily with meals. 11/15/23   Breeback, Jade L, PA-C  cyclobenzaprine (FLEXERIL) 10 MG tablet TAKE 1 TABLET BY MOUTH DAILY AS NEEDED up to three times a day FOR BACK PAIN 11/10/23   Breeback, Jade L, PA-C  diclofenac Sodium (VOLTAREN) 1 % GEL Apply 4 g topically 4 (four) times daily. To affected joint. 11/29/22   Breeback, Jade L, PA-C  donepezil (ARICEPT) 10 MG tablet TAKE 1 TABLET(10 MG) BY MOUTH AT BEDTIME 07/05/23   Breeback, Jade L, PA-C  DULoxetine (CYMBALTA) 60 MG capsule TAKE TWO CAPSULES BY MOUTH DAILY 11/01/23   Breeback, Jade L, PA-C  famotidine (PEPCID) 40 MG tablet TAKE 1 TABLET(40 MG) BY MOUTH AT BEDTIME 06/27/23   Breeback, Jade L, PA-C  ferrous sulfate 325 (65 FE) MG EC tablet TAKE ONE TABLET (325 MG) BY MOUTH DAILY WITH BREAKFAST 10/23/23   Breeback, Jade L, PA-C  fexofenadine (ALLEGRA) 180 MG tablet Take 1 tablet (180 mg total) by mouth daily. 10/11/23   Breeback, Jade L, PA-C  fluticasone (FLONASE) 50 MCG/ACT nasal spray Place 2 sprays into both nostrils daily. 09/23/22   Breeback, Jade L, PA-C  furosemide (LASIX) 40 MG tablet TAKE ONE TABLET BY MOUTH EVERY DAY 11/29/23   Breeback, Jade L, PA-C  gabapentin (NEURONTIN) 600 MG tablet Take 2 tablets (1,200 mg total) by mouth 3 (three) times daily. 07/18/23   Breeback, Jade L, PA-C  hydrOXYzine (ATARAX) 25 MG tablet Take one tablet three times a day for anxiety. 10/06/23   Breeback, Jade L, PA-C  levothyroxine (SYNTHROID) 75 MCG tablet TAKE ONE TABLET BY MOUTH DAILY AT 8AM EVERY MORNING 11/29/23   Breeback, Jade L, PA-C  LINZESS 290 MCG CAPS capsule TAKE ONE CAPSULE BY MOUTH EVERY DAY 11/29/23   Breeback, Jade L, PA-C  metoprolol tartrate (LOPRESSOR) 25 MG tablet TAKE ONE TABLET (25MG ) BY MOUTH TWICE DAILY 11/01/23   Breeback, Jade L,  PA-C  mirtazapine (REMERON) 7.5 MG tablet Take by mouth. 01/14/23   [provider]  ondansetron (ZOFRAN ODT) 4 MG disintegrating tablet Take 1 tablet (4 mg total) by mouth every 8 (eight) hours as needed for nausea or vomiting. 09/23/22   Breeback, Jade L, PA-C  pantoprazole (PROTONIX) 40 MG tablet TAKE ONE TABLET BY MOUTH EVERY DAY 11/29/23   Breeback, Jade L, PA-C  potassium chloride (KLOR-CON) 10 MEQ tablet TAKE ONE TABLET (10 MEQ) BY MOUTH DAILY 10/02/23   Breeback, Jade L, PA-C  PREVNAR 20 0.5 ML injection  08/11/23   [provider]  rosuvastatin (CRESTOR) 40 MG tablet Take 1 tablet (40 mg total) by mouth daily. 12/12/23 03/11/24  Lenise Quince, MD  [facility] injection  08/11/23   [provider]  traMADol (ULTRAM) 50 MG tablet TAKE 2 TABLETS BY MOUTH EVERY 8 TO 12 HOURS FOR CHRONIC PAIN Patient not taking: Reported on 12/12/2023 12/10/23   Breeback, Jade L, PA-C  traMADol (ULTRAM) 50 MG tablet TAKE 2 TABLETS BY MOUTH EVERY 8 TO 12 HOURS FOR CHRONIC PAIN 11/17/23   Breeback, Jade L, PA-C  traMADol (ULTRAM) 50 MG tablet TAKE 2 TABLETS BY MOUTH EVERY 8 TO 12 HOURS FOR CHRONIC PAIN 12/13/23   Breeback, Jade L, PA-C  traZODone (DESYREL) 50 MG tablet TAKE 1 TO 2 TABLETS BY MOUTH EVERY NIGHT 30 MINUTES BEFORE BEDTIME 10/06/23   Breeback, Jade L, PA-C  triamcinolone (KENALOG) 0.1 % paste APPLY A THIN LAYER ON MOUTH ULCERS 2 TO 3 TIMES DAILY AFTER MEALS FOR 5 TO 7 DAYS 11/30/22   Breeback, Jade L, PA-C  Vitamin D, Ergocalciferol, (DRISDOL) 1.25 MG (50000 UNIT) CAPS capsule Take by mouth. 01/07/21   [provider]    Family History Family History  Problem Relation Age of Onset   Cancer Other    Depression Other    Diabetes Other    Hypertension Other    Breast cancer Other     Social History Social History   Tobacco Use   Smoking status: Every Day    Current packs/day: 1.00    Average packs/day: 1 pack/day for 52.3 years (52.3 ttl pk-yrs)    Types:  Cigarettes, E-cigarettes    Start date: 1973    Last attempt to quit: 05/30/2014   Smokeless tobacco: Never  Vaping Use   Vaping status: Some Days   Substances: Nicotine  Substance Use Topics   Alcohol use: No    Alcohol/week: 0.0 standard drinks of alcohol   Drug use: No     Allergies   Penicillins, Ranitidine, Alendronate, and Bisphosphonates   Review of Systems Review of Systems + sore throat + cough No pleuritic pain ? wheezing + nasal congestion + post-nasal drainage + sinus pain/pressure No itchy/red eyes ? earache No hemoptysis No SOB + fever No nausea No vomiting No abdominal pain No diarrhea No urinary symptoms No skin rash + fatigue No myalgias + headache   Physical Exam Triage Vital Signs ED Triage Vitals  Encounter Vitals Group     BP 12/29/23 1608 138/73     Systolic BP Percentile --      Diastolic BP Percentile --      Pulse Rate 12/29/23 1608 66     Resp 12/29/23 1608 16     Temp 12/29/23 1608 99 F (37.2 C)     Temp src --      SpO2 12/29/23 1608 98 %     Weight --      Height --      Head Circumference --      Peak Flow --      Pain Score 12/29/23 1607 5     Pain Loc --      Pain Education --      Exclude from Growth Chart --    No data found.  Updated Vital Signs BP 138/73   Pulse 66   Temp 99 F (37.2 C)   Resp 16   SpO2 98%   Visual Acuity Right Eye Distance:   Left Eye Distance:   Bilateral Distance:    Right Eye Near:   Left Eye Near:    Bilateral Near:     Physical Exam Nursing notes and Vital Signs reviewed. Appearance:  Patient appears stated age, and  in no acute distress Eyes:  Pupils are equal, round, and reactive to light and accomodation.  Extraocular movement is intact.  Conjunctivae are not inflamed  Ears:  Canals normal.  Tympanic membranes normal.  Nose:  Congested turbinates.  Maxillary sinus tenderness is present.  Pharynx:  Normal Neck:  Supple.  Mildly enlarged lateral nodes are present,  tender to palpation on the left.   Lungs:  Clear to auscultation.  Breath sounds are equal.  Moving air well. Heart:  Regular rate and rhythm without murmurs, rubs, or gallops.  Abdomen:  Nontender without masses or hepatosplenomegaly.  Bowel sounds are present.  No CVA or flank tenderness.  Extremities:  No edema.  Skin:  No rash present.   UC Treatments / Results  Labs (all labs ordered are listed, but only abnormal results are displayed) Labs Reviewed - No data to display  EKG   Radiology No results found.  Procedures Procedures (including critical care time)  Medications Ordered in UC Medications - No data to display  Initial Impression / Assessment and Plan / UC Course  I have reviewed the triage vital signs and the nursing notes.  Pertinent labs & imaging results that were available during my care of the patient were reviewed by me and considered in my medical decision making (see chart for details).    Begin prednisone burst/taper and Augmentin (patient notes that she has taken amoxicillin in the past without adverse effects). Followup with Family Doctor if not improved in one week.                                                                                                                                                                         Final Clinical Impressions(s) / UC Diagnoses   Final diagnoses:  Acute maxillary sinusitis, recurrence not specified  Acute bronchitis, unspecified organism     Discharge Instructions      Take plain guaifenesin (1200mg  extended release tabs such as Mucinex) twice daily, with plenty of water, for cough and congestion.  Get adequate rest.   May use Afrin nasal spray (or generic oxymetazoline) each morning for about 5 days and then discontinue.  Also recommend using saline nasal spray several times daily and saline nasal irrigation (AYR is a common brand).  Use Flonase nasal spray each morning after using Afrin nasal spray  and saline nasal irrigation. Try warm salt water gargles for sore throat.  Stop all antihistamines for now, and other non-prescription cough/cold preparations. May take Delsym Cough Suppressant ("12 Hour Cough Relief") at bedtime for nighttime cough.   If symptoms become significantly worse during the night or over the weekend, proceed to the local emergency room.     ED Prescriptions  Medication Sig Dispense Auth. Provider   amoxicillin-clavulanate (AUGMENTIN) 875-125 MG tablet Take one tab PO Q12hr with food. 14 tablet Leon Rajas, MD   predniSONE (DELTASONE) 20 MG tablet Take one tab by mouth twice daily for 4 days, then one daily. Take with food. 12 tablet Leon Rajas, MD         Leon Rajas, MD 12/30/23 2133

## 2024-01-08 DIAGNOSIS — H353114 Nonexudative age-related macular degeneration, right eye, advanced atrophic with subfoveal involvement: Secondary | ICD-10-CM | POA: Diagnosis not present

## 2024-01-08 DIAGNOSIS — Z961 Presence of intraocular lens: Secondary | ICD-10-CM | POA: Diagnosis not present

## 2024-01-08 DIAGNOSIS — H353123 Nonexudative age-related macular degeneration, left eye, advanced atrophic without subfoveal involvement: Secondary | ICD-10-CM | POA: Diagnosis not present

## 2024-01-08 DIAGNOSIS — H353231 Exudative age-related macular degeneration, bilateral, with active choroidal neovascularization: Secondary | ICD-10-CM | POA: Diagnosis not present

## 2024-01-08 DIAGNOSIS — H43813 Vitreous degeneration, bilateral: Secondary | ICD-10-CM | POA: Diagnosis not present

## 2024-01-12 ENCOUNTER — Other Ambulatory Visit: Payer: Self-pay | Admitting: Physician Assistant

## 2024-01-12 ENCOUNTER — Telehealth: Payer: Self-pay

## 2024-01-12 DIAGNOSIS — R413 Other amnesia: Secondary | ICD-10-CM

## 2024-01-12 DIAGNOSIS — G894 Chronic pain syndrome: Secondary | ICD-10-CM

## 2024-01-12 DIAGNOSIS — E782 Mixed hyperlipidemia: Secondary | ICD-10-CM

## 2024-01-12 MED ORDER — TRAMADOL HCL 50 MG PO TABS: ORAL_TABLET | ORAL | 0 refills | Status: DC

## 2024-01-12 NOTE — Telephone Encounter (Signed)
 Copied from CRM [id number]. Topic: Clinical - Medication Refill >> Jan 12, 2024  2:42 PM Rachael Douglas H wrote: Patient is calling about refill on tramadol  50mg . Let patient know request is pending and could take up to 3 business days to process. She asked if I could send a message to PCP, PA Sandy Crumb because she knows she will fill it for her so she doesn't have to go through the weekend without it.

## 2024-01-12 NOTE — Telephone Encounter (Signed)
 I called and confirmed there are not prescriptions on file.

## 2024-01-15 NOTE — Telephone Encounter (Signed)
 Patient scheduled.

## 2024-01-22 DIAGNOSIS — L400 Psoriasis vulgaris: Secondary | ICD-10-CM | POA: Diagnosis not present

## 2024-01-22 DIAGNOSIS — L853 Xerosis cutis: Secondary | ICD-10-CM | POA: Diagnosis not present

## 2024-01-23 ENCOUNTER — Ambulatory Visit: Admitting: Physician Assistant

## 2024-01-23 DIAGNOSIS — H353114 Nonexudative age-related macular degeneration, right eye, advanced atrophic with subfoveal involvement: Secondary | ICD-10-CM | POA: Diagnosis not present

## 2024-01-24 ENCOUNTER — Ambulatory Visit: Admitting: Physician Assistant

## 2024-01-25 ENCOUNTER — Other Ambulatory Visit: Payer: Self-pay | Admitting: Physician Assistant

## 2024-01-25 DIAGNOSIS — R413 Other amnesia: Secondary | ICD-10-CM

## 2024-01-26 ENCOUNTER — Ambulatory Visit: Admitting: Physician Assistant

## 2024-01-26 VITALS — BP 110/50 | HR 58 | Ht 64.0 in | Wt 122.0 lb

## 2024-01-26 DIAGNOSIS — G894 Chronic pain syndrome: Secondary | ICD-10-CM

## 2024-01-26 MED ORDER — TRAMADOL HCL 50 MG PO TABS: ORAL_TABLET | ORAL | 0 refills | Status: DC

## 2024-01-26 MED ORDER — TRAMADOL HCL 50 MG PO TABS
ORAL_TABLET | ORAL | 0 refills | Status: DC
Start: 2024-03-09 — End: 2024-04-26

## 2024-01-26 NOTE — Progress Notes (Signed)
 Established Patient Office Visit  Subjective   Patient ID: Rachael Douglas, female    DOB: 04/29/1956  Age: 68 y.o. MRN: [id number]  Chief Complaint  Patient presents with   Medical Management of Chronic Issues    Chronic pain syndrome      HPI Pt is a 68 yo female with chronic pain who presents to the clinic for 3 month follow up.   Pt is on tramadol  and doing ok with 2 tablets twice a day.   Got alert from insurance about too many sedating medications. She is rarely using flexeril  and only taking hydroxyzine  regularly.   .. Active Ambulatory Problems    Diagnosis Date Noted   Hypothyroidism 05/16/2014   Anxiety disorder 05/16/2014   Aortic heart valve narrowing 04/12/2013   DDD (degenerative disc disease), lumbar 05/16/2014   Acid reflux 05/16/2014   HLD (hyperlipidemia) 05/16/2014   Arteriosclerosis of coronary artery 08/30/2013   OP (osteoporosis) 05/16/2014   Major depressive disorder, recurrent episode (HCC) 05/16/2014   COPD, moderate (HCC) 09/25/2014   Stricture and stenosis of esophagus 10/23/2014   Aortic valve replaced 10/23/2014   Constipation 12/17/2014   Multiple gastric ulcers 09/05/2016   Collagenous colitis 09/09/2016   Primary insomnia 05/16/2017   Chronic prescription benzodiazepine use 07/04/2017   Left carotid bruit 08/15/2017   Actinic keratoses 12/03/2017   Pruritus 02/21/2018   Eczema 05/30/2018   Palpable mass of lower back 05/31/2018   Chronic bilateral low back pain without sciatica 05/31/2018   Easy bruising 09/12/2018   Family history of Alzheimer's disease 07/29/2019   Closed fracture of distal end of both radius and ulna with routine healing, subsequent encounter 03/09/2021   Memory changes 01/04/2022   At high risk for falls 01/04/2022   Psoriasis 01/21/2022   Iron deficiency anemia secondary to inadequate dietary iron intake 01/21/2022   Axillary mass, right 01/21/2022   Chronic pain syndrome 03/17/2022   Recurrent UTI  05/11/2022   Primary osteoarthritis of right knee 05/11/2022   Acute stress reaction 09/28/2022   Urinary retention 09/28/2022   Hallucinations 11/16/2022   Delirium 11/16/2022   GAD (generalized anxiety disorder) 11/23/2022   Cellulitis of right hand 02/16/2023   Resolved Ambulatory Problems    Diagnosis Date Noted   Arthropathia 07/10/2008   Leg pain 12/04/2012   Anxiety and depression 05/16/2014   Gastric catarrh 08/30/2013   Complication of surgery 11/13/2015   Left foot pain 02/05/2016   Failure to attend appointment 05/05/2016   Diarrhea 08/01/2016   Right shoulder pain 11/23/2016   Left shoulder pain 11/23/2016   Trochanteric bursitis of both hips 12/07/2016   Rash and nonspecific skin eruption 07/04/2017   Ankle ligament laxity, right 05/28/2018   Anxiety 05/31/2018   Headache 02/26/2020   Past Medical History:  Diagnosis Date   Arthritis    Heart disease    Hyperlipidemia    Hypertension    PUD (peptic ulcer disease)    Thyroid  disease     ROS See HPI.    Objective:     BP (!) 110/50   Pulse (!) 58   Ht 5\' 4"  (1.626 m)   Wt 122 lb (55.3 kg)   SpO2 99%   BMI 20.94 kg/m  BP Readings from Last 3 Encounters:  01/26/24 (!) 110/50  12/29/23 138/73  12/12/23 120/68   Wt Readings from Last 3 Encounters:  01/26/24 122 lb (55.3 kg)  12/12/23 129 lb 12.8 oz (58.9 kg)  10/11/23 122 lb (55.3  kg)      Physical Exam Constitutional:      Appearance: Normal appearance.  HENT:     Head: Normocephalic.  Cardiovascular:     Rate and Rhythm: Normal rate and regular rhythm.     Heart sounds: Murmur heard.  Pulmonary:     Effort: Pulmonary effort is normal.     Breath sounds: Normal breath sounds.  Musculoskeletal:     Right lower leg: No edema.     Left lower leg: No edema.  Neurological:     General: No focal deficit present.     Mental Status: She is alert and oriented to person, place, and time.  Psychiatric:        Mood and Affect: Mood normal.        The 10-year ASCVD risk score (Arnett DK, et al., 2019) is: 13.5%    Assessment & Plan:  Rachael AasAaron AasViolet was seen today for medical management of chronic issues.  Diagnoses and all orders for this visit:  Chronic pain syndrome -     traMADol  (ULTRAM ) 50 MG tablet; TAKE 2 TABLETS BY MOUTH EVERY 8 TO 12 HOURS FOR CHRONIC PAIN -     traMADol  (ULTRAM ) 50 MG tablet; TAKE 2 TABLETS BY MOUTH EVERY 8 TO 12 HOURS FOR CHRONIC PAIN -     traMADol  (ULTRAM ) 50 MG tablet; TAKE 2 TABLETS BY MOUTH EVERY 8 TO 12 HOURS FOR CHRONIC PAIN -     Drug Profile, Ur, 9 Drugs   .Rachael AasPDMP reviewed during this encounter. Pain contract UTD and signed Drug panel ordered Refilled tramadol  Follow up in 3 months  Pt is not taking flexeril  regularly at all Continue on hydroxyzine  Discussed sedation risk with patient.     Return in about 3 months (around 04/27/2024).    Florene Brill, PA-C

## 2024-01-28 ENCOUNTER — Encounter: Payer: Self-pay | Admitting: Physician Assistant

## 2024-01-28 LAB — DRUG PROFILE, UR, 9 DRUGS (LABCORP)
Amphetamines, Urine: NEGATIVE ng/mL
Barbiturate Quant, Ur: NEGATIVE ng/mL
Benzodiazepine Quant, Ur: NEGATIVE ng/mL
Cannabinoid Quant, Ur: NEGATIVE ng/mL
Cocaine (Metab.): NEGATIVE ng/mL
Creatinine, Urine: 107.2 mg/dL (ref 20.0–300.0)
Methadone Screen, Urine: NEGATIVE ng/mL
Nitrite Urine, Quantitative: NEGATIVE ug/mL
OPIATE SCREEN URINE: NEGATIVE ng/mL
PCP Quant, Ur: NEGATIVE ng/mL
Propoxyphene: NEGATIVE ng/mL
pH, Urine: 5.4 (ref 4.5–8.9)

## 2024-01-28 NOTE — Progress Notes (Signed)
Normal screening

## 2024-02-02 ENCOUNTER — Other Ambulatory Visit: Payer: Self-pay

## 2024-02-02 DIAGNOSIS — M5416 Radiculopathy, lumbar region: Secondary | ICD-10-CM

## 2024-02-02 DIAGNOSIS — F331 Major depressive disorder, recurrent, moderate: Secondary | ICD-10-CM

## 2024-02-02 DIAGNOSIS — E039 Hypothyroidism, unspecified: Secondary | ICD-10-CM

## 2024-02-02 DIAGNOSIS — R609 Edema, unspecified: Secondary | ICD-10-CM

## 2024-02-02 MED ORDER — POTASSIUM CHLORIDE ER 10 MEQ PO TBCR: 10.0000 meq | EXTENDED_RELEASE_TABLET | Freq: Every day | ORAL | 2 refills | Status: AC

## 2024-02-02 MED ORDER — GABAPENTIN 600 MG PO TABS: 1200.0000 mg | ORAL_TABLET | Freq: Three times a day (TID) | ORAL | 1 refills | Status: DC

## 2024-02-02 NOTE — Telephone Encounter (Signed)
 Requesting rx rf of gabapentin  Gabapentin  600mg   Last written 07/18/2023 Last OV 01/26/2024 Upcoming appt 04/26/2024

## 2024-02-05 ENCOUNTER — Encounter: Payer: Self-pay | Admitting: Physician Assistant

## 2024-02-11 DIAGNOSIS — M542 Cervicalgia: Secondary | ICD-10-CM | POA: Diagnosis not present

## 2024-02-11 DIAGNOSIS — Z87891 Personal history of nicotine dependence: Secondary | ICD-10-CM | POA: Diagnosis not present

## 2024-02-11 DIAGNOSIS — M47812 Spondylosis without myelopathy or radiculopathy, cervical region: Secondary | ICD-10-CM | POA: Diagnosis not present

## 2024-02-11 DIAGNOSIS — M5030 Other cervical disc degeneration, unspecified cervical region: Secondary | ICD-10-CM | POA: Diagnosis not present

## 2024-02-12 DIAGNOSIS — M47812 Spondylosis without myelopathy or radiculopathy, cervical region: Secondary | ICD-10-CM | POA: Diagnosis not present

## 2024-02-14 DIAGNOSIS — T50995A Adverse effect of other drugs, medicaments and biological substances, initial encounter: Secondary | ICD-10-CM | POA: Diagnosis not present

## 2024-02-14 DIAGNOSIS — I959 Hypotension, unspecified: Secondary | ICD-10-CM | POA: Diagnosis not present

## 2024-02-14 DIAGNOSIS — Z1152 Encounter for screening for COVID-19: Secondary | ICD-10-CM | POA: Diagnosis not present

## 2024-02-14 DIAGNOSIS — Z743 Need for continuous supervision: Secondary | ICD-10-CM | POA: Diagnosis not present

## 2024-02-14 DIAGNOSIS — R6889 Other general symptoms and signs: Secondary | ICD-10-CM | POA: Diagnosis not present

## 2024-02-14 DIAGNOSIS — G928 Other toxic encephalopathy: Secondary | ICD-10-CM | POA: Diagnosis not present

## 2024-02-14 DIAGNOSIS — I10 Essential (primary) hypertension: Secondary | ICD-10-CM | POA: Diagnosis not present

## 2024-02-14 DIAGNOSIS — Z952 Presence of prosthetic heart valve: Secondary | ICD-10-CM | POA: Diagnosis not present

## 2024-02-14 DIAGNOSIS — I499 Cardiac arrhythmia, unspecified: Secondary | ICD-10-CM | POA: Diagnosis not present

## 2024-02-14 DIAGNOSIS — J449 Chronic obstructive pulmonary disease, unspecified: Secondary | ICD-10-CM | POA: Diagnosis not present

## 2024-02-14 DIAGNOSIS — Z880 Allergy status to penicillin: Secondary | ICD-10-CM | POA: Diagnosis not present

## 2024-02-14 DIAGNOSIS — R079 Chest pain, unspecified: Secondary | ICD-10-CM | POA: Diagnosis not present

## 2024-02-14 DIAGNOSIS — Z79891 Long term (current) use of opiate analgesic: Secondary | ICD-10-CM | POA: Diagnosis not present

## 2024-02-14 DIAGNOSIS — N179 Acute kidney failure, unspecified: Secondary | ICD-10-CM | POA: Diagnosis not present

## 2024-02-14 DIAGNOSIS — F17290 Nicotine dependence, other tobacco product, uncomplicated: Secondary | ICD-10-CM | POA: Diagnosis not present

## 2024-02-14 DIAGNOSIS — T68XXXA Hypothermia, initial encounter: Secondary | ICD-10-CM | POA: Diagnosis not present

## 2024-02-14 DIAGNOSIS — I2510 Atherosclerotic heart disease of native coronary artery without angina pectoris: Secondary | ICD-10-CM | POA: Diagnosis not present

## 2024-02-14 DIAGNOSIS — K219 Gastro-esophageal reflux disease without esophagitis: Secondary | ICD-10-CM | POA: Diagnosis not present

## 2024-02-14 DIAGNOSIS — R531 Weakness: Secondary | ICD-10-CM | POA: Diagnosis not present

## 2024-02-14 DIAGNOSIS — G894 Chronic pain syndrome: Secondary | ICD-10-CM | POA: Diagnosis not present

## 2024-02-14 DIAGNOSIS — Z79899 Other long term (current) drug therapy: Secondary | ICD-10-CM | POA: Diagnosis not present

## 2024-02-14 DIAGNOSIS — M542 Cervicalgia: Secondary | ICD-10-CM | POA: Diagnosis not present

## 2024-02-14 DIAGNOSIS — R001 Bradycardia, unspecified: Secondary | ICD-10-CM | POA: Diagnosis not present

## 2024-02-14 DIAGNOSIS — Z888 Allergy status to other drugs, medicaments and biological substances status: Secondary | ICD-10-CM | POA: Diagnosis not present

## 2024-02-15 DIAGNOSIS — I959 Hypotension, unspecified: Secondary | ICD-10-CM | POA: Diagnosis not present

## 2024-02-16 NOTE — Progress Notes (Signed)
 CARE COORDINATOR CONTACT WITH PATIENT/CAREGIVER AFTER HOSPITAL DISCHARGE   Patient contact attempted x2. Left voicemail requesting call back. Coordinator's name, contact information, availability provided.  Encouraged to schedule Hospital follow up appointment with their Non NH PCP.

## 2024-02-19 ENCOUNTER — Encounter: Payer: Self-pay | Admitting: Acute Care

## 2024-02-22 ENCOUNTER — Other Ambulatory Visit: Payer: Self-pay | Admitting: Physician Assistant

## 2024-02-22 DIAGNOSIS — F411 Generalized anxiety disorder: Secondary | ICD-10-CM

## 2024-02-22 MED ORDER — HYDROXYZINE HCL 25 MG PO TABS: ORAL_TABLET | ORAL | 1 refills | Status: DC

## 2024-02-22 NOTE — Telephone Encounter (Signed)
 Copied from CRM [id number]. Topic: Clinical - Medication Refill >> Feb 22, 2024  1:18 PM Kevelyn M wrote: Medication: hydrOXYzine  (ATARAX ) 25 MG tablet  Has the patient contacted their pharmacy? Yes (Agent: If no, request that the patient contact the pharmacy for the refill. If patient does not wish to contact the pharmacy document the reason why and proceed with request.) (Agent: If yes, when and what did the pharmacy advise?)  This is the patient's preferred pharmacy:  [id number] - [place], [place] - [address] AT [facility] OF [address] [place] [address] Phone: [phone] Fax: [phone]   Is this the correct pharmacy for this prescription? Yes If no, delete pharmacy and type the correct one.   Has the prescription been filled recently? Yes  Is the patient out of the medication? Yes  Has the patient been seen for an appointment in the last year OR does the patient have an upcoming appointment? Yes  Can we respond through MyChart? Yes  Agent: Please be advised that Rx refills may take up to 3 business days. We ask that you follow-up with your pharmacy.

## 2024-02-26 DIAGNOSIS — H353123 Nonexudative age-related macular degeneration, left eye, advanced atrophic without subfoveal involvement: Secondary | ICD-10-CM | POA: Diagnosis not present

## 2024-02-26 DIAGNOSIS — H43813 Vitreous degeneration, bilateral: Secondary | ICD-10-CM | POA: Diagnosis not present

## 2024-02-26 DIAGNOSIS — Z961 Presence of intraocular lens: Secondary | ICD-10-CM | POA: Diagnosis not present

## 2024-02-26 DIAGNOSIS — H353114 Nonexudative age-related macular degeneration, right eye, advanced atrophic with subfoveal involvement: Secondary | ICD-10-CM | POA: Diagnosis not present

## 2024-02-26 DIAGNOSIS — H353231 Exudative age-related macular degeneration, bilateral, with active choroidal neovascularization: Secondary | ICD-10-CM | POA: Diagnosis not present

## 2024-02-28 ENCOUNTER — Other Ambulatory Visit: Payer: Self-pay | Admitting: Physician Assistant

## 2024-02-28 DIAGNOSIS — F5101 Primary insomnia: Secondary | ICD-10-CM

## 2024-02-28 DIAGNOSIS — D508 Other iron deficiency anemias: Secondary | ICD-10-CM

## 2024-03-13 ENCOUNTER — Other Ambulatory Visit: Payer: Self-pay | Admitting: Physician Assistant

## 2024-03-13 DIAGNOSIS — F5101 Primary insomnia: Secondary | ICD-10-CM

## 2024-03-13 MED ORDER — TRAZODONE HCL 50 MG PO TABS: ORAL_TABLET | ORAL | 1 refills | Status: DC

## 2024-03-13 MED ORDER — TRIAMCINOLONE ACETONIDE 0.1 % MT PSTE: PASTE | OROMUCOSAL | 1 refills | Status: DC

## 2024-03-13 NOTE — Telephone Encounter (Signed)
 Copied from CRM [id number]. Topic: Clinical - Medication Refill >> Mar 13, 2024  2:49 PM Fredrica W wrote: Medication:  triamcinolone  (KENALOG ) 0.1 % cream traZODone  (DESYREL ) 50 MG tablet  Has the patient contacted their pharmacy? Yes (Agent: If no, request that the patient contact the pharmacy for the refill. If patient does not wish to contact the pharmacy document the reason why and proceed with request.) (Agent: If yes, when and what did the pharmacy advise?)  This is the patient's preferred pharmacy:  [facility] [place] [address] [place] [address] Phone: [phone] Fax: [phone]  Is this the correct pharmacy for this prescription? Yes If no, delete pharmacy and type the correct one.   Has the prescription been filled recently? No  Is the patient out of the medication? N/A  Has the patient been seen for an appointment in the last year OR does the patient have an upcoming appointment? Yes  Can we respond through MyChart? No  Agent: Please be advised that Rx refills may take up to 3 business days. We ask that you follow-up with your pharmacy.

## 2024-03-15 ENCOUNTER — Other Ambulatory Visit: Payer: Self-pay | Admitting: Physician Assistant

## 2024-03-15 DIAGNOSIS — G8929 Other chronic pain: Secondary | ICD-10-CM

## 2024-03-15 DIAGNOSIS — G894 Chronic pain syndrome: Secondary | ICD-10-CM

## 2024-03-25 ENCOUNTER — Telehealth: Payer: Self-pay

## 2024-03-25 DIAGNOSIS — Z961 Presence of intraocular lens: Secondary | ICD-10-CM | POA: Diagnosis not present

## 2024-03-25 DIAGNOSIS — H43813 Vitreous degeneration, bilateral: Secondary | ICD-10-CM | POA: Diagnosis not present

## 2024-03-25 DIAGNOSIS — H353114 Nonexudative age-related macular degeneration, right eye, advanced atrophic with subfoveal involvement: Secondary | ICD-10-CM | POA: Diagnosis not present

## 2024-03-25 DIAGNOSIS — H353123 Nonexudative age-related macular degeneration, left eye, advanced atrophic without subfoveal involvement: Secondary | ICD-10-CM | POA: Diagnosis not present

## 2024-03-25 DIAGNOSIS — H353231 Exudative age-related macular degeneration, bilateral, with active choroidal neovascularization: Secondary | ICD-10-CM | POA: Diagnosis not present

## 2024-03-25 NOTE — Telephone Encounter (Signed)
 I called patient. We didn't reach out to her. I did give her the time and date of next appointment.

## 2024-03-25 NOTE — Telephone Encounter (Signed)
 Copied from CRM [id number]. Topic: General - Other >> Mar 21, 2024  9:24 AM Miquel SAILOR wrote: Reason for CRM: Patient Cynthya returned office call. No notes Needs call back for her or PT [phone]

## 2024-03-29 ENCOUNTER — Other Ambulatory Visit: Payer: Self-pay | Admitting: Physician Assistant

## 2024-03-29 DIAGNOSIS — M1711 Unilateral primary osteoarthritis, right knee: Secondary | ICD-10-CM

## 2024-04-01 ENCOUNTER — Telehealth: Payer: Self-pay | Admitting: Physician Assistant

## 2024-04-01 DIAGNOSIS — F5101 Primary insomnia: Secondary | ICD-10-CM

## 2024-04-01 NOTE — Telephone Encounter (Unsigned)
 Copied from CRM [id number]. Topic: Clinical - Medication Refill >> Apr 01, 2024  2:17 PM Zane F wrote: Patient is contacting the office after pharmacy has made several failed attempts to get the patient their prescription. Patient is now out of the listed prescription and needs it to assist her with sleep.    Medication: traZODone  (DESYREL ) 50 MG tablet  Has the patient contacted their pharmacy? Yes   This is the patient's preferred pharmacy:  [facility] #[id number] - [place], [place] - [address] AT [facility] OF [address] [place] [address] Phone: [phone] Fax: [phone]    Is this the correct pharmacy for this prescription? Yes If no, delete pharmacy and type the correct one.   Has the prescription been filled recently? No  Is the patient out of the medication? Yes  Has the patient been seen for an appointment in the last year OR does the patient have an upcoming appointment? Yes  Can we respond through MyChart? Yes  Agent: Please be advised that Rx refills may take up to 3 business days. We ask that you follow-up with your pharmacy.

## 2024-04-02 MED ORDER — TRAZODONE HCL 50 MG PO TABS: ORAL_TABLET | ORAL | 1 refills | Status: DC

## 2024-04-23 ENCOUNTER — Other Ambulatory Visit: Payer: Self-pay | Admitting: Physician Assistant

## 2024-04-26 ENCOUNTER — Other Ambulatory Visit: Payer: Self-pay | Admitting: Physician Assistant

## 2024-04-26 ENCOUNTER — Encounter: Payer: Self-pay | Admitting: Physician Assistant

## 2024-04-26 ENCOUNTER — Ambulatory Visit (INDEPENDENT_AMBULATORY_CARE_PROVIDER_SITE_OTHER): Admitting: Physician Assistant

## 2024-04-26 VITALS — BP 135/66 | HR 87 | Ht 64.0 in | Wt 125.0 lb

## 2024-04-26 DIAGNOSIS — F5101 Primary insomnia: Secondary | ICD-10-CM | POA: Diagnosis not present

## 2024-04-26 DIAGNOSIS — S52609D Unspecified fracture of lower end of unspecified ulna, subsequent encounter for closed fracture with routine healing: Secondary | ICD-10-CM

## 2024-04-26 DIAGNOSIS — F331 Major depressive disorder, recurrent, moderate: Secondary | ICD-10-CM

## 2024-04-26 DIAGNOSIS — Z79899 Other long term (current) drug therapy: Secondary | ICD-10-CM

## 2024-04-26 DIAGNOSIS — G894 Chronic pain syndrome: Secondary | ICD-10-CM | POA: Diagnosis not present

## 2024-04-26 DIAGNOSIS — Z13220 Encounter for screening for lipoid disorders: Secondary | ICD-10-CM

## 2024-04-26 DIAGNOSIS — M545 Low back pain, unspecified: Secondary | ICD-10-CM | POA: Diagnosis not present

## 2024-04-26 DIAGNOSIS — L409 Psoriasis, unspecified: Secondary | ICD-10-CM

## 2024-04-26 DIAGNOSIS — Z131 Encounter for screening for diabetes mellitus: Secondary | ICD-10-CM

## 2024-04-26 DIAGNOSIS — E039 Hypothyroidism, unspecified: Secondary | ICD-10-CM | POA: Diagnosis not present

## 2024-04-26 DIAGNOSIS — S52509D Unspecified fracture of the lower end of unspecified radius, subsequent encounter for closed fracture with routine healing: Secondary | ICD-10-CM | POA: Diagnosis not present

## 2024-04-26 DIAGNOSIS — R609 Edema, unspecified: Secondary | ICD-10-CM

## 2024-04-26 MED ORDER — TRAMADOL HCL 50 MG PO TABS: ORAL_TABLET | ORAL | 0 refills | Status: DC

## 2024-04-26 MED ORDER — TRAMADOL HCL 50 MG PO TABS: ORAL_TABLET | ORAL | 0 refills | Status: AC

## 2024-04-26 MED ORDER — FAMOTIDINE 40 MG PO TABS: ORAL_TABLET | ORAL | 3 refills | Status: DC

## 2024-04-26 MED ORDER — CYCLOBENZAPRINE HCL 10 MG PO TABS: ORAL_TABLET | ORAL | 5 refills | Status: DC

## 2024-04-27 LAB — CBC WITH DIFFERENTIAL/PLATELET
Basophils Absolute: 0 x10E3/uL (ref 0.0–0.2)
Basos: 0 %
EOS (ABSOLUTE): 0.1 x10E3/uL (ref 0.0–0.4)
Eos: 1 %
Hematocrit: 44.2 % (ref 34.0–46.6)
Hemoglobin: 14.3 g/dL (ref 11.1–15.9)
Immature Grans (Abs): 0 x10E3/uL (ref 0.0–0.1)
Immature Granulocytes: 0 %
Lymphocytes Absolute: 2.2 x10E3/uL (ref 0.7–3.1)
Lymphs: 31 %
MCH: 30.2 pg (ref 26.6–33.0)
MCHC: 32.4 g/dL (ref 31.5–35.7)
MCV: 93 fL (ref 79–97)
Monocytes Absolute: 0.4 x10E3/uL (ref 0.1–0.9)
Monocytes: 6 %
Neutrophils Absolute: 4.4 x10E3/uL (ref 1.4–7.0)
Neutrophils: 62 %
Platelets: 206 x10E3/uL (ref 150–450)
RBC: 4.73 x10E6/uL (ref 3.77–5.28)
RDW: 13.1 % (ref 11.7–15.4)
WBC: 7.1 x10E3/uL (ref 3.4–10.8)

## 2024-04-27 LAB — CMP14+EGFR
ALT: 11 IU/L (ref 0–32)
AST: 18 IU/L (ref 0–40)
Albumin: 4.2 g/dL (ref 3.9–4.9)
Alkaline Phosphatase: 83 IU/L (ref 44–121)
BUN/Creatinine Ratio: 8 — ABNORMAL LOW (ref 12–28)
BUN: 7 mg/dL — ABNORMAL LOW (ref 8–27)
Bilirubin Total: 0.3 mg/dL (ref 0.0–1.2)
CO2: 25 mmol/L (ref 20–29)
Calcium: 9.3 mg/dL (ref 8.7–10.3)
Chloride: 100 mmol/L (ref 96–106)
Creatinine, Ser: 0.83 mg/dL (ref 0.57–1.00)
Globulin, Total: 2.3 g/dL (ref 1.5–4.5)
Glucose: 80 mg/dL (ref 70–99)
Potassium: 4.1 mmol/L (ref 3.5–5.2)
Sodium: 141 mmol/L (ref 134–144)
Total Protein: 6.5 g/dL (ref 6.0–8.5)
eGFR: 76 mL/min/1.73 (ref >59)

## 2024-04-27 LAB — LIPID PANEL
Chol/HDL Ratio: 2.6 ratio (ref 0.0–4.4)
Cholesterol, Total: 179 mg/dL (ref 100–199)
HDL: 68 mg/dL (ref >39)
LDL Chol Calc (NIH): 97 mg/dL (ref 0–99)
Triglycerides: 78 mg/dL (ref 0–149)
VLDL Cholesterol Cal: 14 mg/dL (ref 5–40)

## 2024-04-27 LAB — VITAMIN D 25 HYDROXY (VIT D DEFICIENCY, FRACTURES): Vit D, 25-Hydroxy: 35.1 ng/mL (ref 30.0–100.0)

## 2024-04-27 LAB — TSH+FREE T4
Free T4: 1.78 ng/dL — ABNORMAL HIGH (ref 0.82–1.77)
TSH: 2.82 u[IU]/mL (ref 0.450–4.500)

## 2024-04-29 ENCOUNTER — Ambulatory Visit: Payer: Self-pay | Admitting: Physician Assistant

## 2024-04-29 ENCOUNTER — Encounter: Payer: Self-pay | Admitting: Physician Assistant

## 2024-04-29 DIAGNOSIS — F331 Major depressive disorder, recurrent, moderate: Secondary | ICD-10-CM | POA: Insufficient documentation

## 2024-04-29 NOTE — Progress Notes (Signed)
 [place],   Cholesterol looks better.  TSH stable.  Kidney and liver function look good.  Labs look good.

## 2024-04-29 NOTE — Progress Notes (Signed)
 Established Patient Office Visit  Subjective   Patient ID: Rachael Douglas, female    DOB: 1956-04-24  Age: 68 y.o. MRN: [id number]  Chief Complaint  Patient presents with   Medical Management of Chronic Issues    HPI Pt is a 68 yo female who presents to the clinic for chronic pain syndrome medication refills.   Pt is doing well and pain is controlled for the most part. Tramadol  helps. She continues on celebrex  as well. She is also on SKYRIZI for psoriatic arthritis.   She is sleeping well and mood is controlled. No concerns.    ROS See HPI.    Objective:     BP 135/66   Pulse 87   Ht 5' 4 (1.626 m)   Wt 125 lb (56.7 kg)   SpO2 99%   BMI 21.46 kg/m  BP Readings from Last 3 Encounters:  04/26/24 135/66  01/26/24 (!) 110/50  12/29/23 138/73   Wt Readings from Last 3 Encounters:  04/26/24 125 lb (56.7 kg)  01/26/24 122 lb (55.3 kg)  12/12/23 129 lb 12.8 oz (58.9 kg)      Physical Exam Constitutional:      Appearance: Normal appearance.  HENT:     Head: Normocephalic.  Cardiovascular:     Rate and Rhythm: Normal rate.     Heart sounds: Murmur heard.  Pulmonary:     Effort: Pulmonary effort is normal.     Breath sounds: Normal breath sounds.  Musculoskeletal:     Right lower leg: No edema.     Left lower leg: No edema.  Neurological:     Mental Status: She is alert and oriented to person, place, and time.  Psychiatric:        Mood and Affect: Mood normal.        Behavior: Behavior normal.       The 10-year ASCVD risk score (Arnett DK, et al., 2019) is: 18.7%    Assessment & Plan:  SABRASABRAViolet was seen today for medical management of chronic issues.  Diagnoses and all orders for this visit:  Chronic pain syndrome -     cyclobenzaprine  (FLEXERIL ) 10 MG tablet; TAKE 1 TABLET BY MOUTH DAILY UP TO THREE TIMES DAILY AS NEEDED FOR BACK PAIN -     traMADol  (ULTRAM ) 50 MG tablet; TAKE 2 TABLETS BY MOUTH EVERY 8 TO 12 HOURS FOR CHRONIC PAIN -      traMADol  (ULTRAM ) 50 MG tablet; TAKE 2 TABLETS BY MOUTH EVERY 8 TO 12 HOURS FOR CHRONIC PAIN -     traMADol  (ULTRAM ) 50 MG tablet; TAKE 2 TABLETS BY MOUTH EVERY 8 TO 12 HOURS FOR CHRONIC PAIN  Primary insomnia  Closed fracture of distal end of both radius and ulna with routine healing, subsequent encounter -     famotidine  (PEPCID ) 40 MG tablet; TAKE 1 TABLET(40 MG) BY MOUTH AT BEDTIME  Medication management -     TSH + free T4 -     Lipid panel -     CMP14+EGFR -     VITAMIN D  25 Hydroxy (Vit-D Deficiency, Fractures) -     CBC w/Diff/Platelet  Acute bilateral low back pain without sciatica  Acquired hypothyroidism -     TSH + free T4  Screening for lipid disorders -     Lipid panel  Screening for diabetes mellitus -     CMP14+EGFR  Moderate episode of recurrent major depressive disorder (HCC)  Psoriasis   Pain contract UTD on  01/31/2024 UDA UTD .SABRAPDMP reviewed during this encounter. No concerns Tramadol  refilled Follow up in 3 months  Fasting labs ordered for medication management  Vitals look great today Mood and sleeping well controlled  Declined any help with smoking cessation Declined health prevention measures such as vaccines   Return in about 3 months (around 07/27/2024).    Cole Eastridge, PA-C

## 2024-05-10 ENCOUNTER — Other Ambulatory Visit: Payer: Self-pay | Admitting: Physician Assistant

## 2024-05-10 DIAGNOSIS — R609 Edema, unspecified: Secondary | ICD-10-CM

## 2024-05-10 NOTE — Telephone Encounter (Signed)
 Copied from CRM #[id number]. Topic: Clinical - Medication Refill >> May 10, 2024  4:11 PM Susanna ORN wrote: Medication: furosemide  (LASIX ) 40 MG tablet  Has the patient contacted their pharmacy? No, pharmacy calling in to request refill.  (Agent: If no, request that the patient contact the pharmacy for the refill. If patient does not wish to contact the pharmacy document the reason why and proceed with request.) (Agent: If yes, when and what did the pharmacy advise?)  This is the patient's preferred pharmacy:   [facility] - [place], [place] - [address] [place] [address] Phone: [phone] Fax: [phone]  Is this the correct pharmacy for this prescription? Yes If no, delete pharmacy and type the correct one.   Has the prescription been filled recently? Yes  Is the patient out of the medication? N/A  Has the patient been seen for an appointment in the last year OR does the patient have an upcoming appointment? Yes  Can we respond through MyChart? Yes  Agent: Please be advised that Rx refills may take up to 3 business days. We ask that you follow-up with your pharmacy.

## 2024-05-21 ENCOUNTER — Encounter: Payer: Self-pay | Admitting: Sports Medicine

## 2024-05-21 DIAGNOSIS — H353231 Exudative age-related macular degeneration, bilateral, with active choroidal neovascularization: Secondary | ICD-10-CM | POA: Diagnosis not present

## 2024-05-23 ENCOUNTER — Other Ambulatory Visit: Payer: Self-pay | Admitting: Physician Assistant

## 2024-05-23 DIAGNOSIS — G894 Chronic pain syndrome: Secondary | ICD-10-CM

## 2024-05-28 ENCOUNTER — Other Ambulatory Visit: Payer: Self-pay | Admitting: Physician Assistant

## 2024-05-28 DIAGNOSIS — R413 Other amnesia: Secondary | ICD-10-CM

## 2024-06-17 ENCOUNTER — Ambulatory Visit: Payer: Self-pay

## 2024-06-17 ENCOUNTER — Telehealth: Payer: Self-pay

## 2024-06-17 NOTE — Telephone Encounter (Signed)
 FYI Only or Action Required?: Action required by provider: Pharmacy needing verification.  Patient was last seen in primary care on 04/26/2024 by Antoniette Vermell CROME, PA-C.  Called Nurse Triage reporting Medication Problem.  .  Triage Disposition: Call PCP Now  Patient/caregiver understands and will follow disposition?: Yes   Reason for Disposition  [1] Pharmacy calling with prescription question AND [2] triager unable to answer question  Answer Assessment - Initial Assessment Questions This RN spoke with Alfonso from [employer] regarding a prescription for the patient. Budesonide 3 mg. Pharmacy calling requesting information regarding how many capsules the patient is supposed to take daily and states she received a fax that detailed different instructions. Requesting a call back to  [phone].    1. NAME of MEDICINE: What medicine(s) are you calling about?     Budesonide 3 mg  2. QUESTION: What is your question? (e.g., double dose of medicine, side effect)     Needs further verification on instructions on how the patient should take medication  3. PRESCRIBER: Who prescribed the medicine? Reason: if prescribed by specialist, call should be referred to that group.     PCP  Protocols used: Medication Question Call-A-AH

## 2024-06-17 NOTE — Telephone Encounter (Signed)
 Did not see this medication in patient current medication list

## 2024-06-17 NOTE — Telephone Encounter (Signed)
 Copied from CRM #[id number]. Topic: Clinical - Medication Question >> Jun 17, 2024 10:31 AM Larissa RAMAN wrote: Reason for CRM: [facility] calling to verify instructions for Budesonide 3mg  tablet.

## 2024-06-18 NOTE — Telephone Encounter (Signed)
 Pt was called she is no longer on this medication. Please discontinue prescription.

## 2024-06-18 NOTE — Telephone Encounter (Signed)
 Spoke with [facility] -  informed him that should be Budesonide 3mg  once per day-  He is wanting to know if this is a dose decrease as patient was last given Budesonide 3mg   taking 3 capsules per day by Elsie Queen , MD - [place]. ?

## 2024-06-19 ENCOUNTER — Encounter: Payer: Self-pay | Admitting: Physician Assistant

## 2024-06-19 ENCOUNTER — Ambulatory Visit (INDEPENDENT_AMBULATORY_CARE_PROVIDER_SITE_OTHER): Admitting: Physician Assistant

## 2024-06-19 VITALS — BP 132/51 | HR 77 | Temp 98.0°F | Ht 64.0 in | Wt 121.0 lb

## 2024-06-19 DIAGNOSIS — J0140 Acute pansinusitis, unspecified: Secondary | ICD-10-CM

## 2024-06-19 DIAGNOSIS — R6889 Other general symptoms and signs: Secondary | ICD-10-CM | POA: Diagnosis not present

## 2024-06-19 LAB — POCT RAPID STREP A (OFFICE): Rapid Strep A Screen: NEGATIVE

## 2024-06-19 LAB — POC SOFIA 2 FLU + SARS ANTIGEN FIA
Influenza A, POC: NEGATIVE
Influenza B, POC: NEGATIVE
SARS Coronavirus 2 Ag: NEGATIVE

## 2024-06-19 MED ORDER — FLUTICASONE PROPIONATE 50 MCG/ACT NA SUSP: 2.0000 | Freq: Every day | NASAL | 0 refills | Status: AC

## 2024-06-19 MED ORDER — AZITHROMYCIN 250 MG PO TABS: ORAL_TABLET | ORAL | 0 refills | Status: AC

## 2024-06-19 NOTE — Telephone Encounter (Signed)
 Spoke with pharmacy and they are discontinuing the prescription

## 2024-06-19 NOTE — Progress Notes (Signed)
 Acute Office Visit  Subjective:     Patient ID: Rachael Douglas, female    DOB: 03/31/1956, 68 y.o.   MRN: [id number]  Chief Complaint  Patient presents with   Cough    HPI Discussed the use of AI scribe software for clinical note transcription with the patient, who gave verbal consent to proceed.  History of Present Illness Rachael Douglas is a 68 year old female who presents with nasal congestion and runny nose for two weeks.  Nasal congestion and rhinorrhea - Nasal congestion and runny nose for approximately two weeks - Nasal discharge is yellowish-green in color - Symptoms localized to the nose - No associated ear pain - No chest involvement - No breathing difficulties beyond nasal congestion  Symptom management and allergies - Using over-the-counter Benadryl for symptom relief - No use of other antihistamines such as Zyrtec or Claritin - Allergic to penicillin.  Epidemiological exposure - No other family members currently sick    ROS See HPI.      Objective:    BP (!) 132/51   Pulse 77   Temp 98 F (36.7 C) (Oral)   Ht 5' 4 (1.626 m)   Wt 121 lb (54.9 kg)   SpO2 99%   BMI 20.77 kg/m  BP Readings from Last 3 Encounters:  06/19/24 (!) 132/51  04/26/24 135/66  01/26/24 (!) 110/50   Wt Readings from Last 3 Encounters:  06/19/24 121 lb (54.9 kg)  04/26/24 125 lb (56.7 kg)  01/26/24 122 lb (55.3 kg)      Physical Exam Constitutional:      Appearance: Normal appearance.  HENT:     Head: Normocephalic.     Right Ear: Tympanic membrane, ear canal and external ear normal. There is no impacted cerumen.     Left Ear: Tympanic membrane, ear canal and external ear normal. There is no impacted cerumen.     Nose: Nose normal. No congestion or rhinorrhea.     Mouth/Throat:     Mouth: Mucous membranes are moist.     Pharynx: Posterior oropharyngeal erythema present. No oropharyngeal exudate.  Eyes:     Conjunctiva/sclera: Conjunctivae normal.   Cardiovascular:     Rate and Rhythm: Normal rate and regular rhythm.     Heart sounds: Murmur heard.  Pulmonary:     Effort: Pulmonary effort is normal.     Breath sounds: Normal breath sounds.  Musculoskeletal:     Cervical back: Normal range of motion and neck supple. No tenderness.  Lymphadenopathy:     Cervical: Cervical adenopathy present.  Neurological:     Mental Status: She is alert.  Psychiatric:        Mood and Affect: Mood normal.    .. Results for orders placed or performed in visit on 06/19/24  POC SOFIA 2 FLU + SARS ANTIGEN FIA   Collection Time: 06/19/24  8:54 AM  Result Value Ref Range   Influenza A, POC Negative Negative   Influenza B, POC Negative Negative   SARS Coronavirus 2 Ag Negative Negative  POCT rapid strep A   Collection Time: 06/19/24  8:54 AM  Result Value Ref Range   Rapid Strep A Screen Negative Negative       Assessment & Plan:  SABRASABRAViolet was seen today for cough.  Diagnoses and all orders for this visit:  Flu-like symptoms -     POC SOFIA 2 FLU + SARS ANTIGEN FIA -     POCT rapid strep A  Acute non-recurrent pansinusitis -     azithromycin  (ZITHROMAX  Z-PAK) 250 MG tablet; Take 2 tablets (500 mg) on  Day 1,  followed by 1 tablet (250 mg) once daily on Days 2 through 5. -     fluticasone  (FLONASE ) 50 MCG/ACT nasal spray; Place 2 sprays into both nostrils daily.    Assessment & Plan Acute sinusitis Acute sinusitis with persistent symptoms for two weeks. Negative for influenza, COVID-19, and streptococcal infection. Allergic to penicillin.  -sent zpak and flonase  to pharmacy -follow up as needed if symptoms persist or worsen.   Rachael Briel, PA-C

## 2024-06-19 NOTE — Patient Instructions (Signed)

## 2024-06-21 ENCOUNTER — Other Ambulatory Visit: Payer: Self-pay | Admitting: Physician Assistant

## 2024-06-21 DIAGNOSIS — R413 Other amnesia: Secondary | ICD-10-CM

## 2024-06-21 NOTE — Telephone Encounter (Signed)
 Copied from CRM [id number]. Topic: Clinical - Medication Refill >> Jun 21, 2024  9:35 AM Winona R wrote: Medication: donepezil  (ARICEPT ) 10 MG tablet  Has the patient contacted their pharmacy? Yes, Pharmacy calling on behave of pt  (Agent: If no, request that the patient contact the pharmacy for the refill. If patient does not wish to contact the pharmacy document the reason why and proceed with request.) (Agent: If yes, when and what did the pharmacy advise?)  This is the patient's preferred pharmacy:   [facility] - [place], [place] - [address] [place] [address] Phone: [phone] Fax: [phone]  Is this the correct pharmacy for this prescription? Yes If no, delete pharmacy and type the correct one.   Has the prescription been filled recently? Yes  Is the patient out of the medication? No, Oct 8th  Has the patient been seen for an appointment in the last year OR does the patient have an upcoming appointment? Yes  Can we respond through MyChart? Not avail to answer  Agent: Please be advised that Rx refills may take up to 3 business days. We ask that you follow-up with your pharmacy.

## 2024-06-26 ENCOUNTER — Other Ambulatory Visit: Payer: Self-pay | Admitting: Physician Assistant

## 2024-06-26 DIAGNOSIS — S52509D Unspecified fracture of the lower end of unspecified radius, subsequent encounter for closed fracture with routine healing: Secondary | ICD-10-CM

## 2024-06-26 DIAGNOSIS — L820 Inflamed seborrheic keratosis: Secondary | ICD-10-CM | POA: Diagnosis not present

## 2024-06-26 DIAGNOSIS — G894 Chronic pain syndrome: Secondary | ICD-10-CM

## 2024-06-26 DIAGNOSIS — L400 Psoriasis vulgaris: Secondary | ICD-10-CM | POA: Diagnosis not present

## 2024-06-26 DIAGNOSIS — F5101 Primary insomnia: Secondary | ICD-10-CM

## 2024-06-26 NOTE — Telephone Encounter (Signed)
 Also added traZODone  (DESYREL ) 50 MG tablet her request, please advise.

## 2024-06-26 NOTE — Telephone Encounter (Signed)
 Copied from CRM #[id number]. Topic: Clinical - Medication Refill >> Jun 26, 2024 10:41 AM Rosaria E wrote: Medication:  traMADol  (ULTRAM ) 50 MG tablet    Has the patient contacted their pharmacy? Yes (Agent: If no, request that the patient contact the pharmacy for the refill. If patient does not wish to contact the pharmacy document the reason why and proceed with request.) (Agent: If yes, when and what did the pharmacy advise?)  This is the patient's preferred pharmacy:  [facility] #[id number] - [place], [place] - [address] AT [facility] OF [address] [place] [address] Phone: [phone] Fax: [phone]  Is this the correct pharmacy for this prescription? Yes If no, delete pharmacy and type the correct one.   Has the prescription been filled recently? Yes  Is the patient out of the medication? Yes  Has the patient been seen for an appointment in the last year OR does the patient have an upcoming appointment? Yes  Can we respond through MyChart? Yes  Agent: Please be advised that Rx refills may take up to 3 business days. We ask that you follow-up with your pharmacy.

## 2024-06-28 NOTE — Telephone Encounter (Signed)
 Both medications have already been sent to the pharmacy.

## 2024-07-03 ENCOUNTER — Other Ambulatory Visit: Payer: Self-pay

## 2024-07-03 DIAGNOSIS — G894 Chronic pain syndrome: Secondary | ICD-10-CM

## 2024-07-03 MED ORDER — TRAMADOL HCL 50 MG PO TABS: ORAL_TABLET | ORAL | 0 refills | Status: DC

## 2024-07-08 ENCOUNTER — Other Ambulatory Visit: Payer: Self-pay | Admitting: Physician Assistant

## 2024-07-08 DIAGNOSIS — H353123 Nonexudative age-related macular degeneration, left eye, advanced atrophic without subfoveal involvement: Secondary | ICD-10-CM | POA: Diagnosis not present

## 2024-07-08 DIAGNOSIS — Z961 Presence of intraocular lens: Secondary | ICD-10-CM | POA: Diagnosis not present

## 2024-07-08 DIAGNOSIS — H353114 Nonexudative age-related macular degeneration, right eye, advanced atrophic with subfoveal involvement: Secondary | ICD-10-CM | POA: Diagnosis not present

## 2024-07-08 DIAGNOSIS — H43813 Vitreous degeneration, bilateral: Secondary | ICD-10-CM | POA: Diagnosis not present

## 2024-07-08 DIAGNOSIS — H353231 Exudative age-related macular degeneration, bilateral, with active choroidal neovascularization: Secondary | ICD-10-CM | POA: Diagnosis not present

## 2024-07-24 ENCOUNTER — Telehealth: Payer: Self-pay | Admitting: Physician Assistant

## 2024-07-24 NOTE — Telephone Encounter (Signed)
 Copied from CRM #[id number]. Topic: Clinical - Medication Refill >> Jul 24, 2024  5:24 PM Winona R wrote: Medication: Creon  36000 units  delayed release - I do not see this on her med list   Has the patient contacted their pharmacy? Yes, the pharmacy is requesting it (Agent: If no, request that the patient contact the pharmacy for the refill. If patient does not wish to contact the pharmacy document the reason why and proceed with request.) (Agent: If yes, when and what did the pharmacy advise?)   This is the patient's preferred pharmacy:      [facility] - [place], [place] - [address] [place] [address] Phone: [phone] Fax: [phone]

## 2024-07-24 NOTE — Telephone Encounter (Unsigned)
 Copied from CRM #[id number]. Topic: Clinical - Medication Refill >> Jul 24, 2024  5:24 PM Winona R wrote: Medication: Creon  36000 units  delayed release - I do not see this on her med list  Has the patient contacted their pharmacy? Yes, the pharmacy is requesting it (Agent: If no, request that the patient contact the pharmacy for the refill. If patient does not wish to contact the pharmacy document the reason why and proceed with request.) (Agent: If yes, when and what did the pharmacy advise?)  This is the patient's preferred pharmacy:    [facility] - [place] - [address] [place] [address] Phone: [phone] Fax: [phone]  Is this the correct pharmacy for this prescription? Yes If no, delete pharmacy and type the correct one.   Has the prescription been filled recently? Unknown  Is the patient out of the medication?yes   Has the patient been seen for an appointment in the last year OR does the patient have an upcoming appointment? Yes  Can we respond through MyChart? Yes  Agent: Please be advised that Rx refills may take up to 3 business days. We ask that you follow-up with your pharmacy.

## 2024-07-26 MED ORDER — PANCRELIPASE (LIP-PROT-AMYL) 36000-114000 UNITS PO CPEP: 36000.0000 [IU] | ORAL_CAPSULE | Freq: Three times a day (TID) | ORAL | 3 refills | Status: AC

## 2024-07-26 NOTE — Telephone Encounter (Signed)
 Sent to pharmacy

## 2024-07-28 ENCOUNTER — Other Ambulatory Visit: Payer: Self-pay | Admitting: Physician Assistant

## 2024-07-28 DIAGNOSIS — G894 Chronic pain syndrome: Secondary | ICD-10-CM

## 2024-07-29 ENCOUNTER — Other Ambulatory Visit: Payer: Self-pay | Admitting: Physician Assistant

## 2024-07-29 DIAGNOSIS — G894 Chronic pain syndrome: Secondary | ICD-10-CM

## 2024-07-29 DIAGNOSIS — D508 Other iron deficiency anemias: Secondary | ICD-10-CM

## 2024-07-29 DIAGNOSIS — F411 Generalized anxiety disorder: Secondary | ICD-10-CM

## 2024-07-29 DIAGNOSIS — M5416 Radiculopathy, lumbar region: Secondary | ICD-10-CM

## 2024-07-29 NOTE — Telephone Encounter (Signed)
 Copied from CRM [id number]. Topic: Clinical - Medication Refill >> Jul 29, 2024  1:18 PM Joesph B wrote: Medication: traMADol  (ULTRAM ) 50 MG tablet   Has the patient contacted their pharmacy? Yes (Agent: If no, request that the patient contact the pharmacy for the refill. If patient does not wish to contact the pharmacy document the reason why and proceed with request.) (Agent: If yes, when and what did the pharmacy advise?)  This is the patient's preferred pharmacy:  [facility] #[id number] - [place], [place] - [address] AT [facility] OF [address] [place] [address] Phone: [phone] Fax: [phone]   Is this the correct pharmacy for this prescription? Yes If no, delete pharmacy and type the correct one.   Has the prescription been filled recently? Yes  Is the patient out of the medication? Yes  Has the patient been seen for an appointment in the last year OR does the patient have an upcoming appointment? Yes  Can we respond through MyChart? Phone call   Agent: Please be advised that Rx refills may take up to 3 business days. We ask that you follow-up with your pharmacy.

## 2024-07-30 ENCOUNTER — Encounter: Payer: Self-pay | Admitting: Acute Care

## 2024-08-02 ENCOUNTER — Ambulatory Visit: Admitting: Physician Assistant

## 2024-08-02 DIAGNOSIS — G894 Chronic pain syndrome: Secondary | ICD-10-CM

## 2024-08-07 NOTE — Progress Notes (Deleted)
 HPI: FU aortic valve replacement. Previously followed at [facility]. Had bioprosthetic aortic valve replacement in 2014. Apparently had no obstructive coronary disease preoperatively. She has had a previous monitor for palpitations that was normal by report. Carotid Dopplers December 2018 showed no significant stenosis. Left subclavian flow was disturbed.  Echocardiogram July 2023 showed normal LV function, grade 1 diastolic dysfunction, status post aortic valve replacement with mean gradient 19.7 mmHg, no aortic insufficiency.  Chest CT December 2024 showed three-vessel coronary atherosclerosis and emphysema.  Since last seen   Current Outpatient Medications  Medication Sig Dispense Refill   albuterol  (VENTOLIN  HFA) 108 (90 Base) MCG/ACT inhaler Inhale 1-2 puffs into the lungs every 6 (six) hours as needed for wheezing or shortness of breath. INHALE 2 PUFFS EVERY 6 HOURS AS NEEDED 6.7 g 2   AMBULATORY NON FORMULARY MEDICATION In and out catheter and supplies to self catherize every night. 100 Device 0   ARIPiprazole  (ABILIFY ) 10 MG tablet TAKE ONE TABLET BY MOUTH EVERY DAY 90 tablet 11   azithromycin  (ZITHROMAX  Z-PAK) 250 MG tablet Take 2 tablets (500 mg) on  Day 1,  followed by 1 tablet (250 mg) once daily on Days 2 through 5. 6 tablet 0   busPIRone  (BUSPAR ) 15 MG tablet TAKE ONE TABLET (15 MG) BY MOUTH THREE TIMES DAILY 270 tablet 11   celecoxib  (CELEBREX ) 200 MG capsule TAKE 1 TO 2 CAPSULES BY MOUTH DAILY AS NEEDED FOR PAIN 140 capsule 11   clobetasol  ointment (TEMOVATE ) 0.05 % Apply topically.     cyclobenzaprine  (FLEXERIL ) 10 MG tablet TAKE ONE TABLET BY MOUTH DAILY UP TO THREE TIMES DAILY AS NEEDED FOR BACK PAIN 90 tablet 2   diclofenac  Sodium (VOLTAREN ) 1 % GEL Apply 4 g topically 4 (four) times daily. To affected joint. 350 g 1   donepezil  (ARICEPT ) 10 MG tablet TAKE ONE TABLET (10 MG) BY MOUTH AT BEDTIME 90 tablet 11   DULoxetine  (CYMBALTA ) 60 MG capsule TAKE TWO CAPSULES BY MOUTH DAILY  180 capsule 11   famotidine  (PEPCID ) 40 MG tablet TAKE ONE TABLET (40 MG) BY MOUTH AT BEDTIME 90 tablet 11   ferrous sulfate  325 (65 FE) MG EC tablet TAKE ONE TABLET (325 MG) BY MOUTH DAILY WITH BREAKFAST 170 tablet 11   fexofenadine  (ALLEGRA ) 180 MG tablet Take 1 tablet (180 mg total) by mouth daily. 90 tablet 3   fluticasone  (FLONASE ) 50 MCG/ACT nasal spray Place 2 sprays into both nostrils daily. 16 g 0   fluticasone  (FLONASE ) 50 MCG/ACT nasal spray Place 2 sprays into both nostrils daily. 16 g 0   furosemide  (LASIX ) 40 MG tablet TAKE ONE TABLET (40 MG) BY MOUTH DAILY 90 tablet 11   gabapentin  (NEURONTIN ) 600 MG tablet TAKE TWO TABLETS (1200 MG) BY MOUTH THREE TIMES DAILY 540 tablet 11   hydrOXYzine  (ATARAX ) 25 MG tablet Take one tablet three times a day for anxiety. 270 tablet 1   levothyroxine  (SYNTHROID ) 75 MCG tablet TAKE ONE TABLET BY MOUTH DAILY AT 8AM EVERY MORNING 90 tablet 11   lipase/protease/amylase (CREON ) 36000 UNITS CPEP capsule Take 1 capsule (36,000 Units total) by mouth 3 (three) times daily with meals. 300 capsule 3   metoprolol  tartrate (LOPRESSOR ) 25 MG tablet TAKE ONE TABLET (25MG ) BY MOUTH TWICE DAILY 180 tablet 11   ondansetron  (ZOFRAN  ODT) 4 MG disintegrating tablet Take 1 tablet (4 mg total) by mouth every 8 (eight) hours as needed for nausea or vomiting. 12 tablet 0   pantoprazole  (  PROTONIX ) 40 MG tablet TAKE ONE TABLET BY MOUTH EVERY DAY 90 tablet 11   potassium chloride  (KLOR-CON ) 10 MEQ tablet Take 1 tablet (10 mEq total) by mouth daily. 90 tablet 2   rosuvastatin  (CRESTOR ) 40 MG tablet Take 1 tablet (40 mg total) by mouth daily. 90 tablet 3   SKYRIZI PEN 150 MG/ML pen      traMADol  (ULTRAM ) 50 MG tablet TAKE 2 TABLETS BY MOUTH EVERY 8 TO 12 HOURS FOR CHRONIC PAIN 120 tablet 0   traMADol  (ULTRAM ) 50 MG tablet TAKE 2 TABLETS BY MOUTH EVERY 8 TO 12 HOURS FOR CHRONIC PAIN 120 tablet 0   traMADol  (ULTRAM ) 50 MG tablet TAKE 2 TABLETS BY MOUTH EVERY 8 TO 12 HOURS FOR  CHRONIC PAIN 120 tablet 0   [START ON 08/31/2024] traMADol  (ULTRAM ) 50 MG tablet TAKE 2 TABLETS BY MOUTH EVERY 8 TO 12 HOURS FOR CHRONIC PAIN 120 tablet 0   [START ON 09/30/2024] traMADol  (ULTRAM ) 50 MG tablet TAKE 2 TABLETS BY MOUTH EVERY 8 TO 12 HOURS FOR CHRONIC PAIN 120 tablet 0   traZODone  (DESYREL ) 50 MG tablet TAKE 1 TO 2 TABLETS BY MOUTH EVERY NIGHT 30 MINUTES BEFORE BEDTIME 180 tablet 1   Vitamin D , Ergocalciferol , (DRISDOL ) 1.25 MG (50000 UNIT) CAPS capsule Take by mouth.     No current facility-administered medications for this visit.     Past Medical History:  Diagnosis Date   Arthritis    Heart disease    Hyperlipidemia    Hypertension    PUD (peptic ulcer disease)    Thyroid  disease     Past Surgical History:  Procedure Laterality Date   AORTIC VALVE REPLACEMENT     TOTAL ABDOMINAL HYSTERECTOMY      Social History   Socioeconomic History   Marital status: Married    Spouse name: Ubaldo   Number of children: 2   Years of education: 12   Highest education level: 12th grade  Occupational History    Comment: Retired  Tobacco Use   Smoking status: Every Day    Current packs/day: 1.00    Average packs/day: 1 pack/day for 52.9 years (52.9 ttl pk-yrs)    Types: Cigarettes, E-cigarettes    Start date: 1973    Last attempt to quit: 05/30/2014   Smokeless tobacco: Never  Vaping Use   Vaping status: Some Days   Substances: Nicotine  Substance and Sexual Activity   Alcohol use: No    Alcohol/week: 0.0 standard drinks of alcohol   Drug use: No   Sexual activity: Not Currently    Partners: Male    Comment: married  Other Topics Concern   Not on file  Social History Narrative   Lives with her husband. She enjoys reading and watching television.   Social Drivers of Corporate Investment Banker Strain: Low Risk  (03/07/2022)   Overall Financial Resource Strain (CARDIA)    Difficulty of Paying Living Expenses: Not hard at all  Food Insecurity: No Food Insecurity  (02/14/2024)   Received from [facility]   Hunger Vital Sign    Within the past 12 months, you worried that your food would run out before you got the money to buy more.: Never true    Within the past 12 months, the food you bought just didn't last and you didn't have money to get more.: Never true  Transportation Needs: No Transportation Needs (02/14/2024)   Received from [facility] - Transportation  Lack of Transportation (Medical): No    Lack of Transportation (Non-Medical): No  Physical Activity: Inactive (03/07/2022)   Exercise Vital Sign    Days of Exercise per Week: 0 days    Minutes of Exercise per Session: 0 min  Stress: No Stress Concern Present (02/14/2024)   Received from [facility] of Occupational Health - Occupational Stress Questionnaire    Feeling of Stress : Not at all  Social Connections: Moderately Isolated (03/07/2022)   Social Connection and Isolation Panel    Frequency of Communication with Friends and Family: More than three times a week    Frequency of Social Gatherings with Friends and Family: More than three times a week    Attends Religious Services: Never    Database Administrator or Organizations: No    Attends Banker Meetings: Never    Marital Status: Married  Catering Manager Violence: Not At Risk (02/14/2024)   Received from [facility]   HITS    Over the last 12 months how often did your partner physically hurt you?: Never    Over the last 12 months how often did your partner insult you or talk down to you?: Never    Over the last 12 months how often did your partner threaten you with physical harm?: Never    Over the last 12 months how often did your partner scream or curse at you?: Never    Family History  Problem Relation Age of Onset   Cancer Other    Depression Other    Diabetes Other    Hypertension Other    Breast cancer Other     ROS: no fevers or chills, productive cough,  hemoptysis, dysphasia, odynophagia, melena, hematochezia, dysuria, hematuria, rash, seizure activity, orthopnea, PND, pedal edema, claudication. Remaining systems are negative.  Physical Exam: Well-developed well-nourished in no acute distress.  Skin is warm and dry.  HEENT is normal.  Neck is supple.  Chest is clear to auscultation with normal expansion.  Cardiovascular exam is regular rate and rhythm.  Abdominal exam nontender or distended. No masses palpated. Extremities show no edema. neuro grossly intact  ECG- personally reviewed  A/P  1 status post aortic valve replacement-most recent echocardiogram showed normally functioning prosthetic aortic valve.  Continue SBE prophylaxis.  2 hypertension-patient's blood pressure is controlled.  Continue present medical regimen.  3 coronary calcification-he denies chest pain.  Continue statin.  4 hyperlipidemia-continue Crestor .  Redell Shallow, MD

## 2024-08-14 ENCOUNTER — Ambulatory Visit: Attending: Cardiology | Admitting: Cardiology

## 2024-08-21 ENCOUNTER — Ambulatory Visit: Admitting: Physician Assistant

## 2024-08-21 NOTE — Progress Notes (Deleted)
 HPI: FU aortic valve replacement. Previously followed at [facility]. Had bioprosthetic aortic valve replacement in 2014. Apparently had no obstructive coronary disease preoperatively. She has had a previous monitor for palpitations that was normal by report. Carotid Dopplers December 2018 showed no significant stenosis. Left subclavian flow was disturbed.  Echocardiogram July 2023 showed normal LV function, grade 1 diastolic dysfunction, status post aortic valve replacement with mean gradient 19.7 mmHg, no aortic insufficiency.  Chest CT December 2024 showed three-vessel coronary atherosclerosis and emphysema.  Since last seen   Current Outpatient Medications  Medication Sig Dispense Refill   albuterol  (VENTOLIN  HFA) 108 (90 Base) MCG/ACT inhaler Inhale 1-2 puffs into the lungs every 6 (six) hours as needed for wheezing or shortness of breath. INHALE 2 PUFFS EVERY 6 HOURS AS NEEDED 6.7 g 2   AMBULATORY NON FORMULARY MEDICATION In and out catheter and supplies to self catherize every night. 100 Device 0   ARIPiprazole  (ABILIFY ) 10 MG tablet TAKE ONE TABLET BY MOUTH EVERY DAY 90 tablet 11   azithromycin  (ZITHROMAX  Z-PAK) 250 MG tablet Take 2 tablets (500 mg) on  Day 1,  followed by 1 tablet (250 mg) once daily on Days 2 through 5. 6 tablet 0   busPIRone  (BUSPAR ) 15 MG tablet TAKE ONE TABLET (15 MG) BY MOUTH THREE TIMES DAILY 270 tablet 11   celecoxib  (CELEBREX ) 200 MG capsule TAKE 1 TO 2 CAPSULES BY MOUTH DAILY AS NEEDED FOR PAIN 140 capsule 11   clobetasol  ointment (TEMOVATE ) 0.05 % Apply topically.     cyclobenzaprine  (FLEXERIL ) 10 MG tablet TAKE ONE TABLET BY MOUTH DAILY UP TO THREE TIMES DAILY AS NEEDED FOR BACK PAIN 90 tablet 2   diclofenac  Sodium (VOLTAREN ) 1 % GEL Apply 4 g topically 4 (four) times daily. To affected joint. 350 g 1   donepezil  (ARICEPT ) 10 MG tablet TAKE ONE TABLET (10 MG) BY MOUTH AT BEDTIME 90 tablet 11   DULoxetine  (CYMBALTA ) 60 MG capsule TAKE TWO CAPSULES BY MOUTH DAILY  180 capsule 11   famotidine  (PEPCID ) 40 MG tablet TAKE ONE TABLET (40 MG) BY MOUTH AT BEDTIME 90 tablet 11   ferrous sulfate  325 (65 FE) MG EC tablet TAKE ONE TABLET (325 MG) BY MOUTH DAILY WITH BREAKFAST 170 tablet 11   fexofenadine  (ALLEGRA ) 180 MG tablet Take 1 tablet (180 mg total) by mouth daily. 90 tablet 3   fluticasone  (FLONASE ) 50 MCG/ACT nasal spray Place 2 sprays into both nostrils daily. 16 g 0   fluticasone  (FLONASE ) 50 MCG/ACT nasal spray Place 2 sprays into both nostrils daily. 16 g 0   furosemide  (LASIX ) 40 MG tablet TAKE ONE TABLET (40 MG) BY MOUTH DAILY 90 tablet 11   gabapentin  (NEURONTIN ) 600 MG tablet TAKE TWO TABLETS (1200 MG) BY MOUTH THREE TIMES DAILY 540 tablet 11   hydrOXYzine  (ATARAX ) 25 MG tablet Take one tablet three times a day for anxiety. 270 tablet 1   levothyroxine  (SYNTHROID ) 75 MCG tablet TAKE ONE TABLET BY MOUTH DAILY AT 8AM EVERY MORNING 90 tablet 11   lipase/protease/amylase (CREON ) 36000 UNITS CPEP capsule Take 1 capsule (36,000 Units total) by mouth 3 (three) times daily with meals. 300 capsule 3   metoprolol  tartrate (LOPRESSOR ) 25 MG tablet TAKE ONE TABLET (25MG ) BY MOUTH TWICE DAILY 180 tablet 11   ondansetron  (ZOFRAN  ODT) 4 MG disintegrating tablet Take 1 tablet (4 mg total) by mouth every 8 (eight) hours as needed for nausea or vomiting. 12 tablet 0   pantoprazole  (  PROTONIX ) 40 MG tablet TAKE ONE TABLET BY MOUTH EVERY DAY 90 tablet 11   potassium chloride  (KLOR-CON ) 10 MEQ tablet Take 1 tablet (10 mEq total) by mouth daily. 90 tablet 2   rosuvastatin  (CRESTOR ) 40 MG tablet Take 1 tablet (40 mg total) by mouth daily. 90 tablet 3   SKYRIZI PEN 150 MG/ML pen      traMADol  (ULTRAM ) 50 MG tablet TAKE 2 TABLETS BY MOUTH EVERY 8 TO 12 HOURS FOR CHRONIC PAIN 120 tablet 0   traMADol  (ULTRAM ) 50 MG tablet TAKE 2 TABLETS BY MOUTH EVERY 8 TO 12 HOURS FOR CHRONIC PAIN 120 tablet 0   traMADol  (ULTRAM ) 50 MG tablet TAKE 2 TABLETS BY MOUTH EVERY 8 TO 12 HOURS FOR  CHRONIC PAIN 120 tablet 0   [START ON 08/31/2024] traMADol  (ULTRAM ) 50 MG tablet TAKE 2 TABLETS BY MOUTH EVERY 8 TO 12 HOURS FOR CHRONIC PAIN 120 tablet 0   [START ON 09/30/2024] traMADol  (ULTRAM ) 50 MG tablet TAKE 2 TABLETS BY MOUTH EVERY 8 TO 12 HOURS FOR CHRONIC PAIN 120 tablet 0   traZODone  (DESYREL ) 50 MG tablet TAKE 1 TO 2 TABLETS BY MOUTH EVERY NIGHT 30 MINUTES BEFORE BEDTIME 180 tablet 1   Vitamin D , Ergocalciferol , (DRISDOL ) 1.25 MG (50000 UNIT) CAPS capsule Take by mouth.     No current facility-administered medications for this visit.     Past Medical History:  Diagnosis Date   Arthritis    Heart disease    Hyperlipidemia    Hypertension    PUD (peptic ulcer disease)    Thyroid  disease     Past Surgical History:  Procedure Laterality Date   AORTIC VALVE REPLACEMENT     TOTAL ABDOMINAL HYSTERECTOMY      Social History   Socioeconomic History   Marital status: Married    Spouse name: Ubaldo   Number of children: 2   Years of education: 12   Highest education level: 12th grade  Occupational History    Comment: Retired  Tobacco Use   Smoking status: Every Day    Current packs/day: 1.00    Average packs/day: 1 pack/day for 52.9 years (52.9 ttl pk-yrs)    Types: Cigarettes, E-cigarettes    Start date: 1973    Last attempt to quit: 05/30/2014   Smokeless tobacco: Never  Vaping Use   Vaping status: Some Days   Substances: Nicotine  Substance and Sexual Activity   Alcohol use: No    Alcohol/week: 0.0 standard drinks of alcohol   Drug use: No   Sexual activity: Not Currently    Partners: Male    Comment: married  Other Topics Concern   Not on file  Social History Narrative   Lives with her husband. She enjoys reading and watching television.   Social Drivers of Corporate Investment Banker Strain: Low Risk  (03/07/2022)   Overall Financial Resource Strain (CARDIA)    Difficulty of Paying Living Expenses: Not hard at all  Food Insecurity: No Food Insecurity  (02/14/2024)   Received from [facility]   Hunger Vital Sign    Within the past 12 months, you worried that your food would run out before you got the money to buy more.: Never true    Within the past 12 months, the food you bought just didn't last and you didn't have money to get more.: Never true  Transportation Needs: No Transportation Needs (02/14/2024)   Received from [facility] - Transportation  Lack of Transportation (Medical): No    Lack of Transportation (Non-Medical): No  Physical Activity: Inactive (03/07/2022)   Exercise Vital Sign    Days of Exercise per Week: 0 days    Minutes of Exercise per Session: 0 min  Stress: No Stress Concern Present (02/14/2024)   Received from [facility] of Occupational Health - Occupational Stress Questionnaire    Feeling of Stress : Not at all  Social Connections: Moderately Isolated (03/07/2022)   Social Connection and Isolation Panel    Frequency of Communication with Friends and Family: More than three times a week    Frequency of Social Gatherings with Friends and Family: More than three times a week    Attends Religious Services: Never    Database Administrator or Organizations: No    Attends Banker Meetings: Never    Marital Status: Married  Catering Manager Violence: Not At Risk (02/14/2024)   Received from [facility]   HITS    Over the last 12 months how often did your partner physically hurt you?: Never    Over the last 12 months how often did your partner insult you or talk down to you?: Never    Over the last 12 months how often did your partner threaten you with physical harm?: Never    Over the last 12 months how often did your partner scream or curse at you?: Never    Family History  Problem Relation Age of Onset   Cancer Other    Depression Other    Diabetes Other    Hypertension Other    Breast cancer Other     ROS: no fevers or chills, productive cough,  hemoptysis, dysphasia, odynophagia, melena, hematochezia, dysuria, hematuria, rash, seizure activity, orthopnea, PND, pedal edema, claudication. Remaining systems are negative.  Physical Exam: Well-developed well-nourished in no acute distress.  Skin is warm and dry.  HEENT is normal.  Neck is supple.  Chest is clear to auscultation with normal expansion.  Cardiovascular exam is regular rate and rhythm.  Abdominal exam nontender or distended. No masses palpated. Extremities show no edema. neuro grossly intact  ECG- personally reviewed  A/P  1 status post aortic valve replacement-most recent echocardiogram shows normally functioning valve.  Will continue with SBE prophylaxis.  2 hyperlipidemia-continue statin.  3 hypertension-patient's blood pressure is controlled.  Continue present medications.  4 history of coronary calcification-continue statin.  Redell Shallow, MD

## 2024-08-28 ENCOUNTER — Other Ambulatory Visit: Payer: Self-pay | Admitting: Physician Assistant

## 2024-08-28 ENCOUNTER — Ambulatory Visit: Attending: Cardiology | Admitting: Cardiology

## 2024-08-28 DIAGNOSIS — F5101 Primary insomnia: Secondary | ICD-10-CM

## 2024-08-28 DIAGNOSIS — G894 Chronic pain syndrome: Secondary | ICD-10-CM

## 2024-08-30 ENCOUNTER — Ambulatory Visit: Payer: Self-pay

## 2024-08-30 NOTE — Telephone Encounter (Signed)
 FYI Only or Action Required?: FYI only for provider: ED advised.  Patient was last seen in primary care on 06/19/2024 by Antoniette Vermell CROME, PA-C.  Called Nurse Triage reporting Hip Pain.  Symptoms began several days ago.  Interventions attempted: Prescription medications: tramdol- not touching it.  Symptoms are: rapidly worsening.  Triage Disposition: Go to ED Now (Notify PCP)  Patient/caregiver understands and will follow disposition?: Yes    Copied from CRM #[id number]. Topic: Clinical - Red Word Triage >> Aug 30, 2024 10:26 AM Mercer PEDLAR wrote: Red Word that prompted transfer to Nurse Triage: Severe pain in legs and back causing difficulty walking. Patient is requesting to come in for a shot for the pain. Reason for Disposition  [1] Unable to urinate (or only a few drops) > 4 hours AND [2] bladder feels very full (e.g., palpable bladder or strong urge to urinate)  Answer Assessment - Initial Assessment Questions Left hip pain that radiates to the front of her leg that started about 2 days ago and just getting worse. States pain is 15/10, can stand but hurts to do so. Denies any fever. States she struggled to urinate this morning, had the urge to go all morning and couldn't go, she did just finally go. Given the increase in significant pain and the difficulty urinating this morning, RN advised pt to go to the ER to be evaluated. RN offered 911, pt states dtr is with her and will take her. Rn did not finish all triage questions due to ER dispo.    1. LOCATION and RADIATION: Where is the pain located? Does the pain spread (shoot) anywhere else?     Left hip to left leg 2. SEVERITY: How bad is the pain? What does it keep you from doing?   (Scale 1-10; or mild, moderate, severe)     15 3 ONSET: When did the pain start? Does it come and go, or is it there all the time?     Began to get worse about 2 days ago 4. AGGRAVATING FACTORS: What makes the hip pain worse? (e.g., walking,  climbing stairs, running)     States can hardly stand 5 OTHER SYMPTOMS: Do you have any other symptoms? (e.g., back pain, pain shooting down leg,  fever, rash)     Difficulty urinating.  Protocols used: Hip Pain-A-AH

## 2024-09-17 ENCOUNTER — Other Ambulatory Visit: Payer: Self-pay | Admitting: Physician Assistant

## 2024-09-17 DIAGNOSIS — G894 Chronic pain syndrome: Secondary | ICD-10-CM

## 2024-09-17 NOTE — Telephone Encounter (Signed)
 Copied from CRM [id number]. Topic: Clinical - Medication Refill >> Sep 17, 2024  9:43 AM Rachael Douglas wrote: Medication: traMADol  (ULTRAM ) 50 MG tablet  Has the patient contacted their pharmacy? Yes   This is the patient's preferred pharmacy:  [id number] - [place], [place] - [address] AT [facility] [address] [place] [address] Phone: [phone] Fax: [phone]  Is this the correct pharmacy for this prescription? Yes If no, delete pharmacy and type the correct one.   Has the prescription been filled recently? Yes  Is the patient out of the medication? Yes  Has the patient been seen for an appointment in the last year OR does the patient have an upcoming appointment? Yes  Can we respond through MyChart? No  Agent: Please be advised that Rx refills may take up to 3 business days. We ask that you follow-up with your pharmacy.

## 2024-10-03 ENCOUNTER — Other Ambulatory Visit: Payer: Self-pay | Admitting: Physician Assistant

## 2024-10-03 DIAGNOSIS — F411 Generalized anxiety disorder: Secondary | ICD-10-CM

## 2024-10-03 NOTE — Telephone Encounter (Signed)
 Copied from CRM [id number]. Topic: Clinical - Medication Refill >> Oct 03, 2024 10:43 AM Mercer PEDLAR wrote: Medication: hydrOXYzine  (ATARAX ) 25 MG tablet  Has the patient contacted their pharmacy? Yes (Agent: If no, request that the patient contact the pharmacy for the refill. If patient does not wish to contact the pharmacy document the reason why and proceed with request.) (Agent: If yes, when and what did the pharmacy advise?)  This is the patient's preferred pharmacy:  [facility] #[id number] - [place], [place] - [address] AT [facility] [address] [place] [address] Phone: [phone] Fax: [phone]  Is this the correct pharmacy for this prescription? Yes If no, delete pharmacy and type the correct one.   Has the prescription been filled recently? No  Is the patient out of the medication? No  Has the patient been seen for an appointment in the last year OR does the patient have an upcoming appointment? Yes  Can we respond through MyChart? No  Agent: Please be advised that Rx refills may take up to 3 business days. We ask that you follow-up with your pharmacy.

## 2024-10-04 MED ORDER — HYDROXYZINE HCL 25 MG PO TABS: ORAL_TABLET | ORAL | 0 refills | Status: AC

## 2024-10-08 ENCOUNTER — Encounter: Payer: Self-pay | Admitting: Medical-Surgical

## 2024-10-08 ENCOUNTER — Ambulatory Visit: Admitting: Medical-Surgical

## 2024-10-08 VITALS — BP 99/52 | HR 65 | Temp 98.5°F | Resp 20 | Ht 64.0 in | Wt 131.0 lb

## 2024-10-08 DIAGNOSIS — J329 Chronic sinusitis, unspecified: Secondary | ICD-10-CM | POA: Diagnosis not present

## 2024-10-08 DIAGNOSIS — J40 Bronchitis, not specified as acute or chronic: Secondary | ICD-10-CM

## 2024-10-08 DIAGNOSIS — J441 Chronic obstructive pulmonary disease with (acute) exacerbation: Secondary | ICD-10-CM

## 2024-10-08 DIAGNOSIS — L989 Disorder of the skin and subcutaneous tissue, unspecified: Secondary | ICD-10-CM

## 2024-10-08 MED ORDER — MUPIROCIN 2 % EX OINT: TOPICAL_OINTMENT | CUTANEOUS | 3 refills | Status: AC

## 2024-10-08 MED ORDER — BENZONATATE 200 MG PO CAPS: 200.0000 mg | ORAL_CAPSULE | Freq: Three times a day (TID) | ORAL | 0 refills | Status: AC | PRN

## 2024-10-08 MED ORDER — PREDNISONE 50 MG PO TABS: 50.0000 mg | ORAL_TABLET | Freq: Every day | ORAL | 0 refills | Status: AC

## 2024-10-08 MED ORDER — HYDROCODONE BIT-HOMATROP MBR 5-1.5 MG/5ML PO SOLN: 5.0000 mL | Freq: Three times a day (TID) | ORAL | 0 refills | Status: AC | PRN

## 2024-10-08 MED ORDER — DOXYCYCLINE HYCLATE 100 MG PO TABS: 100.0000 mg | ORAL_TABLET | Freq: Two times a day (BID) | ORAL | 0 refills | Status: AC

## 2024-10-08 NOTE — Progress Notes (Signed)
 "       Established patient visit   History of Present Illness   Discussed the use of AI scribe software for clinical note transcription with the patient, who gave verbal consent to proceed.  History of Present Illness   Rachael Douglas is a 69 year old female with COPD who presents with worsening cough and congestion.  Cough and upper respiratory symptoms - Persistent cough and nasal congestion for 2 weeks with progressive worsening - Congestion is thick and green with associated nasal irritation and rawness - Sinus drainage causing sore throat and worsening cough - Body aches present - No nausea, vomiting, or difficulty eating or drinking - No fever - Ear popping and clicking - Tenderness and swelling in cheeks  Response to prior and current treatments - Treated for  with azithromycin  (Z-Pak) 4 weeks ago with only minimal relief and incomplete resolution of symptoms - Over-the-counter medications provide only a few hours of relief  Chronic obstructive pulmonary disease (copd) and tobacco use - History of COPD with wheezing and shortness of breath - Long-term smoker and continues to smoke     Physical Exam   Physical Exam Vitals reviewed.  Constitutional:      General: She is not in acute distress.    Appearance: Normal appearance. She is ill-appearing.  HENT:     Head: Normocephalic and atraumatic.     Right Ear: Ear canal and external ear normal. There is no impacted cerumen.     Left Ear: Ear canal and external ear normal. There is no impacted cerumen.     Nose: Congestion present. No rhinorrhea.     Mouth/Throat:     Mouth: Mucous membranes are moist.     Pharynx: Posterior oropharyngeal erythema present. No oropharyngeal exudate.  Eyes:     General: No scleral icterus.       Right eye: No discharge.        Left eye: No discharge.     Extraocular Movements: Extraocular movements intact.     Conjunctiva/sclera: Conjunctivae normal.     Pupils: Pupils are equal,  round, and reactive to light.  Cardiovascular:     Rate and Rhythm: Normal rate and regular rhythm.     Pulses: Normal pulses.     Heart sounds: Normal heart sounds. No murmur heard.    No friction rub. No gallop.  Pulmonary:     Effort: Pulmonary effort is normal. No respiratory distress.     Breath sounds: Normal breath sounds. No wheezing.  Lymphadenopathy:     Cervical: Cervical adenopathy present.  Skin:    General: Skin is warm and dry.  Neurological:     Mental Status: She is alert and oriented to person, place, and time.  Psychiatric:        Mood and Affect: Mood normal.        Behavior: Behavior normal.        Thought Content: Thought content normal.        Judgment: Judgment normal.    Assessment & Plan   Acute sinobronchitis/COPD exacerbation Persistent cough and nasal congestion with thick green discharge and sore throat. Wheezing present. Likely bacterial infection due to duration and severity. - Prescribed doxycycline  100 mg twice daily for one week. - Prescribed Tessalon  Perles three times a day as needed for cough; [employer] coupon printed and provided with AVS. - Prescribed Promethazine -DM, 5 mL 4 times daily as needed. - Prescribed prednisone  50mg  once daily in the morning for five  days.  Nasal skin irritation with risk of secondary infection Nasal skin irritated and raw with scabbing and surrounding erythema.  - Prescribed mupirocin  ointment to be applied three times a day for seven days or until improvement.   Follow up   Return if symptoms worsen or fail to improve. __________________________________ Zada FREDRIK Palin, DNP, APRN, FNP-BC Primary Care and Sports Medicine [facility] [place] "

## 2024-10-15 ENCOUNTER — Other Ambulatory Visit: Payer: Self-pay

## 2024-10-15 ENCOUNTER — Telehealth: Payer: Self-pay

## 2024-10-15 DIAGNOSIS — G894 Chronic pain syndrome: Secondary | ICD-10-CM

## 2024-10-15 MED ORDER — TRAMADOL HCL 50 MG PO TABS: ORAL_TABLET | ORAL | 0 refills | Status: AC

## 2024-10-15 NOTE — Telephone Encounter (Signed)
 Advised patient that refill was sent in on 09/30/24. Patient states she will contact them and let us  know if she has any trouble picking them up.

## 2024-10-15 NOTE — Telephone Encounter (Signed)
 Copied from CRM [id number]. Topic: Clinical - Medication Refill >> Oct 15, 2024 10:02 AM Vivian Z wrote: Medication: traMADol  (ULTRAM ) 50 MG tablet  Has the patient contacted their pharmacy? Yes (Agent: If no, request that the patient contact the pharmacy for the refill. If patient does not wish to contact the pharmacy document the reason why and proceed with request.) (Agent: If yes, when and what did the pharmacy advise?)  This is the patient's preferred pharmacy:  [id number] - [place], [place] - [address] AT [facility] [address] [place] [address] Phone: [phone] Fax: [phone]  Is this the correct pharmacy for this prescription? Yes If no, delete pharmacy and type the correct one.   Has the prescription been filled recently? No  Is the patient out of the medication? Yes  Has the patient been seen for an appointment in the last year OR does the patient have an upcoming appointment? Yes  Can we respond through MyChart? No  Agent: Please be advised that Rx refills may take up to 3 business days. We ask that you follow-up with your pharmacy.

## 2024-10-15 NOTE — Telephone Encounter (Signed)
 Request received for Tramadol  50mg   Spoke with  pharmacy  States Tramadol  prescription written on 08/01/2024 had 3 refills but each refill was a 20 day supply .  She has been filling these every 20 days  08/04/2024, 09/01/2024 and 09/19/2024.   Last OV 10/08/2024 sick visit - joy jessup, np  06/19/2024  sick visit - [facility] 08/082025 chronic pain visit [facility]  Upcoming appt = none

## 2024-10-15 NOTE — Telephone Encounter (Signed)
 PDMP reviewed during this encounter. Changed to 180 tablets.  Sent to pharmacy.
